# Patient Record
Sex: Female | Born: 1950
Health system: Southern US, Community
[De-identification: ages and names within clinical notes are randomized; demographics above are authoritative.]

## PROBLEM LIST (undated history)

## (undated) DIAGNOSIS — E669 Obesity, unspecified: Secondary | ICD-10-CM

## (undated) DIAGNOSIS — I10 Essential (primary) hypertension: Secondary | ICD-10-CM

## (undated) DIAGNOSIS — H409 Unspecified glaucoma: Secondary | ICD-10-CM

## (undated) DIAGNOSIS — H269 Unspecified cataract: Secondary | ICD-10-CM

## (undated) HISTORY — PX: TUBAL LIGATION: SHX77

## (undated) HISTORY — DX: Unspecified cataract: H26.9

## (undated) HISTORY — DX: Essential (primary) hypertension: I10

## (undated) HISTORY — PX: TONSILLECTOMY: SUR1361

## (undated) HISTORY — DX: Obesity, unspecified: E66.9

## (undated) HISTORY — DX: Unspecified glaucoma: H40.9

## (undated) HISTORY — PX: EYE SURGERY: SHX253

---

## 2003-12-03 ENCOUNTER — Other Ambulatory Visit: Admission: RE | Admit: 2003-12-03 | Discharge: 2003-12-03 | Payer: Self-pay | Admitting: Obstetrics and Gynecology

## 2007-08-30 ENCOUNTER — Encounter: Payer: Self-pay | Admitting: Family Medicine

## 2007-10-30 ENCOUNTER — Ambulatory Visit: Payer: Self-pay | Admitting: Family Medicine

## 2007-11-01 ENCOUNTER — Encounter: Payer: Self-pay | Admitting: Family Medicine

## 2007-11-19 DIAGNOSIS — H409 Unspecified glaucoma: Secondary | ICD-10-CM | POA: Insufficient documentation

## 2007-12-11 ENCOUNTER — Ambulatory Visit: Payer: Self-pay | Admitting: Family Medicine

## 2007-12-11 ENCOUNTER — Encounter: Payer: Self-pay | Admitting: Family Medicine

## 2007-12-11 ENCOUNTER — Other Ambulatory Visit: Admission: RE | Admit: 2007-12-11 | Discharge: 2007-12-11 | Payer: Self-pay | Admitting: Family Medicine

## 2008-04-15 ENCOUNTER — Encounter: Payer: Self-pay | Admitting: Family Medicine

## 2008-04-15 HISTORY — PX: COLONOSCOPY: SHX174

## 2008-12-11 ENCOUNTER — Telehealth: Payer: Self-pay | Admitting: Family Medicine

## 2009-03-31 ENCOUNTER — Ambulatory Visit: Payer: Self-pay | Admitting: Family Medicine

## 2009-03-31 ENCOUNTER — Other Ambulatory Visit: Admission: RE | Admit: 2009-03-31 | Discharge: 2009-03-31 | Payer: Self-pay | Admitting: Family Medicine

## 2009-03-31 ENCOUNTER — Encounter: Payer: Self-pay | Admitting: Family Medicine

## 2009-03-31 DIAGNOSIS — R5383 Other fatigue: Secondary | ICD-10-CM

## 2009-03-31 DIAGNOSIS — R5381 Other malaise: Secondary | ICD-10-CM | POA: Insufficient documentation

## 2009-03-31 LAB — CONVERTED CEMR LAB: OCCULT 1: NEGATIVE

## 2009-04-02 DIAGNOSIS — E669 Obesity, unspecified: Secondary | ICD-10-CM | POA: Insufficient documentation

## 2009-04-17 ENCOUNTER — Ambulatory Visit (HOSPITAL_COMMUNITY): Admission: RE | Admit: 2009-04-17 | Discharge: 2009-04-17 | Payer: Self-pay | Admitting: Family Medicine

## 2009-04-17 ENCOUNTER — Encounter: Payer: Self-pay | Admitting: Family Medicine

## 2009-04-17 LAB — CONVERTED CEMR LAB
BUN: 14 mg/dL (ref 6–23)
CO2: 27 meq/L (ref 19–32)
Chloride: 106 meq/L (ref 96–112)
Creatinine, Ser: 0.89 mg/dL (ref 0.40–1.20)
Eosinophils Absolute: 0.1 10*3/uL (ref 0.0–0.7)
Eosinophils Relative: 1 % (ref 0–5)
Glucose, Bld: 81 mg/dL (ref 70–99)
HCT: 40.2 % (ref 36.0–46.0)
Hemoglobin: 13 g/dL (ref 12.0–15.0)
LDL Cholesterol: 57 mg/dL (ref 0–99)
Lymphocytes Relative: 43 % (ref 12–46)
Lymphs Abs: 2.1 10*3/uL (ref 0.7–4.0)
MCV: 90.7 fL (ref 78.0–100.0)
Monocytes Absolute: 0.4 10*3/uL (ref 0.1–1.0)
Monocytes Relative: 9 % (ref 3–12)
Potassium: 4.6 meq/L (ref 3.5–5.3)
TSH: 1.065 microintl units/mL (ref 0.350–4.500)
Triglycerides: 47 mg/dL (ref <150)
VLDL: 9 mg/dL (ref 0–40)
WBC: 4.9 10*3/uL (ref 4.0–10.5)

## 2010-08-11 ENCOUNTER — Ambulatory Visit: Payer: Self-pay | Admitting: Family Medicine

## 2010-08-11 ENCOUNTER — Other Ambulatory Visit
Admission: RE | Admit: 2010-08-11 | Discharge: 2010-08-11 | Payer: Self-pay | Source: Home / Self Care | Admitting: Family Medicine

## 2010-08-11 ENCOUNTER — Ambulatory Visit (HOSPITAL_COMMUNITY)
Admission: RE | Admit: 2010-08-11 | Discharge: 2010-08-11 | Payer: Self-pay | Source: Home / Self Care | Attending: Family Medicine | Admitting: Family Medicine

## 2010-08-11 LAB — CONVERTED CEMR LAB: OCCULT 1: NEGATIVE

## 2010-08-17 ENCOUNTER — Ambulatory Visit (HOSPITAL_COMMUNITY)
Admission: RE | Admit: 2010-08-17 | Discharge: 2010-08-17 | Payer: Self-pay | Source: Home / Self Care | Attending: Family Medicine | Admitting: Family Medicine

## 2010-08-17 ENCOUNTER — Encounter: Payer: Self-pay | Admitting: Family Medicine

## 2010-08-17 LAB — CONVERTED CEMR LAB: Pap Smear: NEGATIVE

## 2010-08-18 LAB — CONVERTED CEMR LAB
Basophils Absolute: 0 10*3/uL (ref 0.0–0.1)
CO2: 27 meq/L (ref 19–32)
Calcium: 8.8 mg/dL (ref 8.4–10.5)
Cholesterol: 119 mg/dL (ref 0–200)
Eosinophils Relative: 2 % (ref 0–5)
HCT: 38.3 % (ref 36.0–46.0)
HDL: 48 mg/dL (ref >39)
Lymphocytes Relative: 42 % (ref 12–46)
Neutrophils Relative %: 47 % (ref 43–77)
Platelets: 196 10*3/uL (ref 150–400)
Potassium: 4.5 meq/L (ref 3.5–5.3)
RDW: 14.1 % (ref 11.5–15.5)
Sodium: 139 meq/L (ref 135–145)
Total CHOL/HDL Ratio: 2.5

## 2010-09-28 NOTE — Letter (Signed)
Summary: demo  demo   Imported By: Lind Guest 01/08/2010 14:01:15  _____________________________________________________________________  External Attachment:    Type:   Image     Comment:   External Document

## 2010-09-28 NOTE — Letter (Signed)
Summary: referrl sheet  referrl sheet   Imported By: Lind Guest 01/08/2010 13:50:28  _____________________________________________________________________  External Attachment:    Type:   Image     Comment:   External Document

## 2010-09-28 NOTE — Letter (Signed)
Summary: office notes  office notes   Imported By: Lind Guest 01/08/2010 13:51:53  _____________________________________________________________________  External Attachment:    Type:   Image     Comment:   External Document

## 2010-09-28 NOTE — Letter (Signed)
Summary: labs  labs   Imported By: Lind Guest 01/08/2010 13:52:28  _____________________________________________________________________  External Attachment:    Type:   Image     Comment:   External Document

## 2010-09-28 NOTE — Letter (Signed)
Summary: history and physial  history and physial   Imported By: Lind Guest 01/08/2010 13:53:49  _____________________________________________________________________  External Attachment:    Type:   Image     Comment:   External Document

## 2010-09-28 NOTE — Letter (Signed)
Summary: history and  physical  history and  physical   Imported By: Lind Guest 01/08/2010 14:02:33  _____________________________________________________________________  External Attachment:    Type:   Image     Comment:   External Document

## 2010-09-28 NOTE — Letter (Signed)
Summary: misc  misc   Imported By: Lind Guest 01/08/2010 13:47:35  _____________________________________________________________________  External Attachment:    Type:   Image     Comment:   External Document

## 2010-09-30 NOTE — Letter (Signed)
Summary: Pap Smear, Normal Letter, [facility]  [address]   [place], [place] [address]   Phone: [phone]  Fax: [phone]          August 17, 2010    Dear: Tanya Rogers    I am pleased to notify you that your PAP smear was normal.  You will need your next PAP smear in:     ____ 3 Months    ____ 6 Months    ____ 12 Months    Please call the office at our office number above, to schedule your next appointment.    Sincerely,     [facility]

## 2010-09-30 NOTE — Assessment & Plan Note (Signed)
Summary: physical/slj   Vital Signs:  Patient profile:   60 year old female Menstrual status:  postmenopausal Height:      63 inches Weight:      199 pounds BMI:     35.38 O2 Sat:      100 % on Room air Pulse rate:   98 / minute Pulse rhythm:   regular Resp:     16 per minute BP sitting:   126 / 78  (left arm)  Vitals Entered By: Adella Hare LPN (August 11, 2010 4:07 PM)  Nutrition Counseling: Patient's BMI is greater than 25 and therefore counseled on weight management options.  O2 Flow:  Room air CC: physical Is Patient Diabetic? No Pain Assessment Patient in pain? no       Vision Screening:Left eye with correction: 20 / 25 Right eye with correction: 20 / 20 Both eyes with correction: 20 / 20        Vision Entered By: Adella Hare LPN (August 11, 2010 4:09 PM)   CC:  physical.  History of Present Illness: Reports  that she has been doing well. Denies recent fever or chills. Denies sinus pressure, nasal congestion , ear pain or sore throat. Denies chest congestion, or cough productive of sputum. Denies chest pain, palpitations, PND, orthopnea or leg swelling. Denies abdominal pain, nausea, vomitting, diarrhea or constipation. Denies change in bowel movements or bloody stool. Denies dysuria , frequency, incontinence or hesitancy. Denies  joint pain, swelling, or reduced mobility. Denies headaches, vertigo, seizures. Denies depression, anxiety or insomnia. Denies  rash, lesions, or itch.     Current Medications (verified): 1)  Betimol 0.5 % Soln (Timolol Hemihydrate) 2)  Fish Oil 1000 Mg Caps (Omega-3 Fatty Acids) 3)  Calcium 1200 1200-1000 Mg-Unit Chew (Calcium Carbonate-Vit D-Min) 4)  B Complex-B12  Tabs (B Complex Vitamins) 5)  Multivitamins  Tabs (Multiple Vitamin) 6)  Folic Acid 1 Mg Tabs (Folic Acid) 7)  Zinc 100 Mg Tabs (Zinc)  Allergies (verified): No Known Drug Allergies  Social History: Retired Diplomatic Services operational officer, does in [place] , 2  CLIENTS 07/2010 Married x 40 yrs Three adult children Never Smoked Alcohol use-no Drug use-no  Review of Systems      See HPI Eyes:  Complains of vision loss-both eyes; glaucoma, needs to see opthalmologist. Endo:  Denies cold intolerance, excessive hunger, excessive thirst, and excessive urination. Heme:  Denies abnormal bruising and bleeding. Allergy:  Complains of seasonal allergies; denies hives or rash and itching eyes; mild.  Physical Exam  General:  Well-developed,obesed,in no acute distress; alert,appropriate and cooperative throughout examination Head:  Normocephalic and atraumatic without obvious abnormalities. No apparent alopecia or balding. Eyes:  vision grossly intact, pupils equal, pupils round, and pupils reactive to light.   Ears:  External ear exam shows no significant lesions or deformities.  Otoscopic examination reveals clear canals, tympanic membranes are intact bilaterally without bulging, retraction, inflammation or discharge. Hearing is grossly normal bilaterally. Nose:  External nasal examination shows no deformity or inflammation. Nasal mucosa are pink and moist without lesions or exudates. Mouth:  Oral mucosa and oropharynx without lesions or exudates.  Teeth in good repair. Neck:  No deformities, masses, or tenderness noted. Chest Wall:  No deformities, masses, or tenderness noted. Breasts:  No mass, nodules, thickening, tenderness, bulging, retraction, inflamation, nipple discharge or skin changes noted.   Lungs:  Normal respiratory effort, chest expands symmetrically. Lungs are clear to auscultation, no crackles or wheezes. Heart:  Normal rate and regular rhythm. S1  and S2 normal without gallop, murmur, click, rub or other extra sounds. Abdomen:  Bowel sounds positive,abdomen soft and non-tender without masses, organomegaly or hernias noted. Rectal:  No external abnormalities noted. Normal sphincter tone. No rectal masses or tenderness. Genitalia:  Normal  introitus for age, no external lesions, no vaginal discharge, mucosa pink and moist, no vaginal or cervical lesions, no vaginal atrophy, no friaility or hemorrhage, normal uterus size and position, no adnexal masses or tenderness Msk:  No deformity or scoliosis noted of thoracic or lumbar spine.   Pulses:  R and L carotid,radial,femoral,dorsalis pedis and posterior tibial pulses are full and equal bilaterally Extremities:  No clubbing, cyanosis, edema, or deformity noted with normal full range of motion of all joints.   Neurologic:  No cranial nerve deficits noted. Station and gait are normal. Plantar reflexes are down-going bilaterally. DTRs are symmetrical throughout. Sensory, motor and coordinative functions appear intact. Skin:  Intact without suspicious lesions or rashes Cervical Nodes:  No lymphadenopathy noted Axillary Nodes:  No palpable lymphadenopathy Inguinal Nodes:  No significant adenopathy Psych:  Cognition and judgment appear intact. Alert and cooperative with normal attention span and concentration. No apparent delusions, illusions, hallucinations   Impression & Recommendations:  Problem # 1:  SCREENING FOR MALIGNANT NEOPLASM OF THE CERVIX (ICD-V76.2) Assessment Comment Only  Orders: Pap Smear (95621)  Problem # 2:  OBESITY (ICD-278.00) Assessment: Deteriorated  Ht: 63 (08/11/2010)   Wt: 199 (08/11/2010)   BMI: 35.38 (08/11/2010) therapeutic lifestyle change discussed and encouraged  Problem # 3:  GLAUCOMA (ICD-365.9) Assessment: Comment Only  Orders: Ophthalmology Referral (Ophthalmology)  Complete Medication List: 1)  Betimol 0.5 % Soln (Timolol hemihydrate) 2)  Fish Oil 1000 Mg Caps (Omega-3 fatty acids) 3)  Calcium 1200 1200-1000 Mg-unit Chew (Calcium carbonate-vit d-min) 4)  B Complex-b12 Tabs (B complex vitamins) 5)  Multivitamins Tabs (Multiple vitamin) 6)  Folic Acid 1 Mg Tabs (Folic acid) 7)  Zinc 100 Mg Tabs (Zinc)  Other Orders: T-Basic Metabolic  Panel ([id number]) T-Lipid Profile [id number]) T-CBC w/Diff [id number]) T-TSH [id number]) T- Hemoglobin A1C [phone]) T-Vitamin D (25-Hydroxy) [phone]) Hemoccult Guaiac-1 spec.(in office) (66063)  Patient Instructions: 1)  F/U in 6 months 2)  It is important that you exercise regularly at least 30 minutes 5 times a week. If you develop chest pain, have severe difficulty breathing, or feel very tired , stop exercising immediately and seek medical attention. 3)  You need to lose weight. Consider a lower calorie diet and regular exercise.  4)  BMP prior to visit, ICD-9: 5)  Lipid Panel prior to visit, ICD-9:  FASTING ASAP 6)  TSH prior to visit, ICD-9: 7)  CBC w/ Diff prior to visit, ICD-9: 8)  hba1c 9)  yOU WILL BE REFERRED TO DR Harlon Flor FOR GLAUCOMA CARE   Orders Added: 1)  Est. Patient 40-64 years [99396] 2)  Ophthalmology Referral [Ophthalmology] 3)  T-Basic Metabolic Panel [[id number]] 4)  T-Lipid Profile [[id number]] 5)  T-CBC w/Diff [[id number]] 6)  T-TSH [[id number]] 7)  T- Hemoglobin A1C [[id number]] 8)  T-Vitamin D (25-Hydroxy) [[id number]] 9)  Hemoccult Guaiac-1 spec.(in office) [82270] 10)  Pap Smear [[id number]]    Laboratory Results  Date/Time Received: August 11, 2010 4:53 PM  Date/Time Reported: August 11, 2010 4:53 PM   Stool - Occult Blood Hemmoccult #1: negative Date: 08/11/2010 Comments: 51301 13L 10/13 118 10/12 [facility] LPN  August 11, 2010 4:53 PM

## 2011-07-22 ENCOUNTER — Other Ambulatory Visit: Payer: Self-pay | Admitting: Family Medicine

## 2011-07-22 DIAGNOSIS — Z139 Encounter for screening, unspecified: Secondary | ICD-10-CM

## 2011-08-15 ENCOUNTER — Encounter: Payer: Self-pay | Admitting: Family Medicine

## 2011-08-15 ENCOUNTER — Ambulatory Visit: Payer: Self-pay | Admitting: Family Medicine

## 2011-08-17 ENCOUNTER — Ambulatory Visit (INDEPENDENT_AMBULATORY_CARE_PROVIDER_SITE_OTHER): Payer: Managed Care, Other (non HMO) | Admitting: Family Medicine

## 2011-08-17 ENCOUNTER — Other Ambulatory Visit (HOSPITAL_COMMUNITY)
Admission: RE | Admit: 2011-08-17 | Discharge: 2011-08-17 | Disposition: A | Discharge status: Home / Self Care | Payer: Managed Care, Other (non HMO) | Source: Ambulatory Visit | Attending: Family Medicine | Admitting: Family Medicine

## 2011-08-17 ENCOUNTER — Encounter: Payer: Self-pay | Admitting: Family Medicine

## 2011-08-17 VITALS — BP 134/84 | HR 80 | Resp 16 | Ht 63.0 in | Wt 203.4 lb

## 2011-08-17 DIAGNOSIS — Z2911 Encounter for prophylactic immunotherapy for respiratory syncytial virus (RSV): Secondary | ICD-10-CM

## 2011-08-17 DIAGNOSIS — Z01419 Encounter for gynecological examination (general) (routine) without abnormal findings: Secondary | ICD-10-CM | POA: Insufficient documentation

## 2011-08-17 DIAGNOSIS — R7309 Other abnormal glucose: Secondary | ICD-10-CM

## 2011-08-17 DIAGNOSIS — H409 Unspecified glaucoma: Secondary | ICD-10-CM

## 2011-08-17 DIAGNOSIS — E669 Obesity, unspecified: Secondary | ICD-10-CM

## 2011-08-17 DIAGNOSIS — Z23 Encounter for immunization: Secondary | ICD-10-CM

## 2011-08-17 DIAGNOSIS — Z0000 Encounter for general adult medical examination without abnormal findings: Secondary | ICD-10-CM

## 2011-08-17 DIAGNOSIS — R7303 Prediabetes: Secondary | ICD-10-CM | POA: Insufficient documentation

## 2011-08-17 LAB — POC HEMOCCULT BLD/STL (OFFICE/1-CARD/DIAGNOSTIC): Fecal Occult Blood, POC: NEGATIVE

## 2011-08-17 NOTE — Patient Instructions (Signed)
F/u in 6 months.  HBa1C today.  Fasting in 6 months cbc, lipids, chem 7, HBa1C , tsh and Vit D  TdaP and Zostavax  pls get the flu shot at the pharmacy  It is important that you exercise regularly at least 30 minutes 5 times a week. If you develop chest pain, have severe difficulty breathing, or feel very tired, stop exercising immediately and seek medical attention   A healthy diet is rich in fruit, vegetables and whole grains. Poultry fish, nuts and beans are a healthy choice for protein rather then red meat. A low sodium diet and drinking 64 ounces of water daily is generally recommended. Oils and sweet should be limited. Carbohydrates especially for those who are diabetic or overweight, should be limited to 30-45 gram per meal. It is important to eat on a regular schedule, at least 3 times daily. Snacks should be primarily fruits, vegetables or nuts.  weight loss goal is 1.5 to 2 pounds per month

## 2011-08-18 LAB — HEMOGLOBIN A1C: Mean Plasma Glucose: 126 mg/dL — ABNORMAL HIGH (ref <117)

## 2011-08-18 NOTE — Progress Notes (Signed)
Subjective:     Patient ID: Tanya Rogers, female   DOB: 09/26/1950, 60 y.o.   MRN: [id number]  HPI The PT is here for follow annual exam and re-evaluation of chronic medical conditions, medication management and review of any available recent lab and radiology data.  Preventive health is updated, specifically  Cancer screening and Immunization.   Questions or concerns regarding consultations or procedures which the PT has had in the interim are  addressed. The PT denies any adverse reactions to current medications since the last visit.  There are no new concerns, except that she is aware of weight gain, and has not been exercising  There are no specific complaints      Review of Systems See HPI Denies recent fever or chills. Denies sinus pressure, nasal congestion, ear pain or sore throat. Denies chest congestion, productive cough or wheezing. Denies chest pains, palpitations and leg swelling Denies abdominal pain, nausea, vomiting,diarrhea or constipation.   Denies dysuria, frequency, hesitancy or incontinence. Denies joint pain, swelling and limitation in mobility. Denies headaches, seizures, numbness, or tingling. Denies depression, anxiety or insomnia. Denies skin break down or rash.        Objective:   Physical Exam Pleasant well nourished female, alert and oriented x 3, in no cardio-pulmonary distress. Afebrile. HEENT No facial trauma or asymetry. Sinuses non tender.  EOMI, PERTL, fundoscopic exam is normal, no hemorhage or exudate.  External ears normal, tympanic membranes clear. Oropharynx moist, no exudate, good dentition. Neck: supple, no adenopathy,JVD or thyromegaly.No bruits.  Chest: Clear to ascultation bilaterally.No crackles or wheezes. Non tender to palpation  Breast: No asymetry,no masses. No nipple discharge or inversion. No axillary or supraclavicular adenopathy  Cardiovascular system; Heart sounds normal,  S1 and  S2 ,no S3.  No murmur, or  thrill. Apical beat not displaced Peripheral pulses normal.  Abdomen: Soft, non tender, no organomegaly or masses. No bruits. Bowel sounds normal. No guarding, tenderness or rebound.  Rectal:  No mass. Guaiac negative stool.  GU: External genitalia normal. No lesions. Vaginal canal normal.No discharge. Uterus normal size, no adnexal masses, no cervical motion or adnexal tenderness.  Musculoskeletal exam: Full ROM of spine, hips , shoulders and knees. No deformity ,swelling or crepitus noted. No muscle wasting or atrophy.   Neurologic: Cranial nerves 2 to 12 intact. Power, tone ,sensation and reflexes normal throughout. No disturbance in gait. No tremor.  Skin: Intact, no ulceration, erythema , scaling or rash noted. Pigmentation normal throughout  Psych; Normal mood and affect. Judgement and concentration normal     Assessment:         Plan:

## 2011-08-18 NOTE — Assessment & Plan Note (Signed)
Deteriorated. Patient re-educated about  the importance of commitment to a  minimum of 150 minutes of exercise per week. The importance of healthy food choices with portion control discussed. Encouraged to start a food diary, count calories and to consider  joining a support group. Sample diet sheets offered. Goals set by the patient for the next several months.    

## 2011-08-18 NOTE — Assessment & Plan Note (Signed)
>>  ASSESSMENT AND PLAN FOR GLAUCOMA WRITTEN ON 08/18/2011  5:02 AM BY SIMPSON, MARGARET E, MD  Followed regularly by opthalmology

## 2011-08-18 NOTE — Assessment & Plan Note (Signed)
20 pound weight gain and no regular exercise, concern that this has worsened , wll f/u lab

## 2011-08-18 NOTE — Assessment & Plan Note (Signed)
Followed regularly by opthalmology

## 2011-08-19 ENCOUNTER — Ambulatory Visit: Payer: Self-pay | Admitting: Family Medicine

## 2011-08-19 ENCOUNTER — Ambulatory Visit (HOSPITAL_COMMUNITY)
Admission: RE | Admit: 2011-08-19 | Discharge: 2011-08-19 | Disposition: A | Discharge status: Home / Self Care | Payer: Managed Care, Other (non HMO) | Source: Ambulatory Visit | Attending: Family Medicine | Admitting: Family Medicine

## 2011-08-19 DIAGNOSIS — Z1231 Encounter for screening mammogram for malignant neoplasm of breast: Secondary | ICD-10-CM | POA: Insufficient documentation

## 2011-08-19 DIAGNOSIS — Z139 Encounter for screening, unspecified: Secondary | ICD-10-CM

## 2011-09-05 ENCOUNTER — Ambulatory Visit (INDEPENDENT_AMBULATORY_CARE_PROVIDER_SITE_OTHER): Payer: Managed Care, Other (non HMO) | Admitting: Family Medicine

## 2011-09-05 ENCOUNTER — Encounter: Payer: Self-pay | Admitting: Family Medicine

## 2011-09-05 VITALS — BP 120/72 | HR 70 | Temp 98.3°F | Resp 18 | Ht 63.0 in | Wt 200.1 lb

## 2011-09-05 DIAGNOSIS — J069 Acute upper respiratory infection, unspecified: Secondary | ICD-10-CM

## 2011-09-05 NOTE — Progress Notes (Signed)
  Subjective:    Patient ID: [name], female    DOB: 1951-05-30, 61 y.o.   MRN: [id number]  HPI Itchy, sore throat for the past 2 days. Denies fever, cough, headache. Positive sick contact with family members who all had upper respiratory infections. Over-the-counter Alka-Seltzer use without any relief. Sore throat associated with mild body ache. 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   Review of Systems -per above GEN- + fatigue,denies fever, weight loss,weakness, recent illness HEENT- denies eye drainage, +itchy ears, +nasal discharge, CVS- denies chest pain, palpitations RESP- denies SOB, cough, wheeze ABD- denies N/V, change in stools, abd pain    Objective:   Physical Exam GEN- NAD, alert and oriented x3 HEENT- PERRL, EOMI, non injected sclera, pink conjunctiva, MMM, oropharynx injected, TM clear bilat Neck- Supple,  Lymph- mild shotty cervical LAD CVS- RRR, no murmur RESP-CTAB EXT- No edema Pulses- Radial, DP- 2+           Assessment & Plan:   URI- viral illness, supportive care, neg strep swab, otherwise healthy individual Chloroseptic spray Given Red flags

## 2011-09-05 NOTE — Patient Instructions (Signed)
Take Vitamin C and plenty of fluids Use Chloroseptic Spray If you develop fever, or worsening of illness come back for a recheck  Upper Respiratory Infection, Adult An upper respiratory infection (URI) is also sometimes known as the common cold. The upper respiratory tract includes the nose, sinuses, throat, trachea, and bronchi. Bronchi are the airways leading to the lungs. Most people improve within 1 week, but symptoms can last up to 2 weeks. A residual cough may last even longer.   CAUSES Many different viruses can infect the tissues lining the upper respiratory tract. The tissues become irritated and inflamed and often become very moist. Mucus production is also common. A cold is contagious. You can easily spread the virus to others by oral contact. This includes kissing, sharing a glass, coughing, or sneezing. Touching your mouth or nose and then touching a surface, which is then touched by another person, can also spread the virus. SYMPTOMS   Symptoms typically develop 1 to 3 days after you come in contact with a cold virus. Symptoms vary from person to person. They may include:  Runny nose.     Sneezing.    Nasal congestion.     Sinus irritation.     Sore throat.     Loss of voice (laryngitis).     Cough.    Fatigue.    Muscle aches.     Loss of appetite.     Headache.    Low-grade fever.  DIAGNOSIS   You might diagnose your own cold based on familiar symptoms, since most people get a cold 2 to 3 times a year. Your caregiver can confirm this based on your exam. Most importantly, your caregiver can check that your symptoms are not due to another disease such as strep throat, sinusitis, pneumonia, asthma, or epiglottitis. Blood tests, throat tests, and X-rays are not necessary to diagnose a common cold, but they may sometimes be helpful in excluding other more serious diseases. Your caregiver will decide if any further tests are required. RISKS AND COMPLICATIONS   You may  be at risk for a more severe case of the common cold if you smoke cigarettes, have chronic heart disease (such as heart failure) or lung disease (such as asthma), or if you have a weakened immune system. The very young and very old are also at risk for more serious infections. Bacterial sinusitis, middle ear infections, and bacterial pneumonia can complicate the common cold. The common cold can worsen asthma and chronic obstructive pulmonary disease (COPD). Sometimes, these complications can require emergency medical care and may be life-threatening. PREVENTION   The best way to protect against getting a cold is to practice good hygiene. Avoid oral or hand contact with people with cold symptoms. Wash your hands often if contact occurs. There is no clear evidence that vitamin C, vitamin E, echinacea, or exercise reduces the chance of developing a cold. However, it is always recommended to get plenty of rest and practice good nutrition. TREATMENT   Treatment is directed at relieving symptoms. There is no cure. Antibiotics are not effective, because the infection is caused by a virus, not by bacteria. Treatment may include:  Increased fluid intake. Sports drinks offer valuable electrolytes, sugars, and fluids.     Breathing heated mist or steam (vaporizer or shower).     Eating chicken soup or other clear broths, and maintaining good nutrition.     Getting plenty of rest.     Using gargles or lozenges for comfort.  Controlling fevers with ibuprofen or acetaminophen as directed by your caregiver.     Increasing usage of your inhaler if you have asthma.  Zinc gel and zinc lozenges, taken in the first 24 hours of the common cold, can shorten the duration and lessen the severity of symptoms. Pain medicines may help with fever, muscle aches, and throat pain. A variety of non-prescription medicines are available to treat congestion and runny nose. Your caregiver can make recommendations and may suggest  nasal or lung inhalers for other symptoms.   HOME CARE INSTRUCTIONS    Only take over-the-counter or prescription medicines for pain, discomfort, or fever as directed by your caregiver.     Use a warm mist humidifier or inhale steam from a shower to increase air moisture. This may keep secretions moist and make it easier to breathe.     Drink enough water and fluids to keep your urine clear or pale yellow.     Rest as needed.     Return to work when your temperature has returned to normal or as your caregiver advises. You may need to stay home longer to avoid infecting others. You can also use a face mask and careful hand washing to prevent spread of the virus.  SEEK MEDICAL CARE IF:    After the first few days, you feel you are getting worse rather than better.     You need your caregiver's advice about medicines to control symptoms.     You develop chills, worsening shortness of breath, or brown or red sputum. These may be signs of pneumonia.     You develop yellow or brown nasal discharge or pain in the face, especially when you bend forward. These may be signs of sinusitis.     You develop a fever, swollen neck glands, pain with swallowing, or white areas in the back of your throat. These may be signs of strep throat.  SEEK IMMEDIATE MEDICAL CARE IF:    You have a fever.     You develop severe or persistent headache, ear pain, sinus pain, or chest pain.     You develop wheezing, a prolonged cough, cough up blood, or have a change in your usual mucus (if you have chronic lung disease).     You develop sore muscles or a stiff neck.  Document Released: 02/08/2001 Document Revised: 04/27/2011 Document Reviewed: 12/17/2010 [facility] Patient Information 2012 [place], [place].

## 2011-09-06 NOTE — Progress Notes (Signed)
Addended by: Kandis Fantasia B on: 09/06/2011 11:32 AM   Modules accepted: Orders

## 2012-02-17 ENCOUNTER — Ambulatory Visit: Payer: Managed Care, Other (non HMO) | Admitting: Family Medicine

## 2012-03-23 ENCOUNTER — Ambulatory Visit: Payer: Managed Care, Other (non HMO) | Admitting: Family Medicine

## 2012-05-04 ENCOUNTER — Ambulatory Visit: Payer: Managed Care, Other (non HMO) | Admitting: Family Medicine

## 2012-06-29 ENCOUNTER — Ambulatory Visit: Payer: Managed Care, Other (non HMO) | Admitting: Family Medicine

## 2012-07-20 ENCOUNTER — Other Ambulatory Visit: Payer: Self-pay | Admitting: Family Medicine

## 2012-07-20 DIAGNOSIS — Z139 Encounter for screening, unspecified: Secondary | ICD-10-CM

## 2012-08-09 ENCOUNTER — Ambulatory Visit: Payer: Managed Care, Other (non HMO) | Admitting: Family Medicine

## 2012-08-23 ENCOUNTER — Encounter: Payer: Self-pay | Admitting: Family Medicine

## 2012-08-23 ENCOUNTER — Ambulatory Visit (INDEPENDENT_AMBULATORY_CARE_PROVIDER_SITE_OTHER): Payer: PRIVATE HEALTH INSURANCE | Admitting: Family Medicine

## 2012-08-23 ENCOUNTER — Other Ambulatory Visit (HOSPITAL_COMMUNITY)
Admission: RE | Admit: 2012-08-23 | Discharge: 2012-08-23 | Disposition: A | Discharge status: Home / Self Care | Payer: Managed Care, Other (non HMO) | Source: Ambulatory Visit | Attending: Family Medicine | Admitting: Family Medicine

## 2012-08-23 ENCOUNTER — Ambulatory Visit (HOSPITAL_COMMUNITY)
Admission: RE | Admit: 2012-08-23 | Discharge: 2012-08-23 | Disposition: A | Discharge status: Home / Self Care | Payer: Managed Care, Other (non HMO) | Source: Ambulatory Visit | Attending: Family Medicine | Admitting: Family Medicine

## 2012-08-23 VITALS — BP 150/84 | HR 69 | Resp 18 | Ht 63.0 in | Wt 196.1 lb

## 2012-08-23 DIAGNOSIS — Z01419 Encounter for gynecological examination (general) (routine) without abnormal findings: Secondary | ICD-10-CM | POA: Insufficient documentation

## 2012-08-23 DIAGNOSIS — I10 Essential (primary) hypertension: Secondary | ICD-10-CM | POA: Insufficient documentation

## 2012-08-23 DIAGNOSIS — Z0000 Encounter for general adult medical examination without abnormal findings: Secondary | ICD-10-CM

## 2012-08-23 DIAGNOSIS — Z1231 Encounter for screening mammogram for malignant neoplasm of breast: Secondary | ICD-10-CM | POA: Insufficient documentation

## 2012-08-23 DIAGNOSIS — R5383 Other fatigue: Secondary | ICD-10-CM

## 2012-08-23 DIAGNOSIS — R7309 Other abnormal glucose: Secondary | ICD-10-CM

## 2012-08-23 DIAGNOSIS — Z1211 Encounter for screening for malignant neoplasm of colon: Secondary | ICD-10-CM

## 2012-08-23 DIAGNOSIS — N3000 Acute cystitis without hematuria: Secondary | ICD-10-CM | POA: Insufficient documentation

## 2012-08-23 DIAGNOSIS — R5381 Other malaise: Secondary | ICD-10-CM

## 2012-08-23 DIAGNOSIS — R7303 Prediabetes: Secondary | ICD-10-CM

## 2012-08-23 DIAGNOSIS — Z1151 Encounter for screening for human papillomavirus (HPV): Secondary | ICD-10-CM | POA: Insufficient documentation

## 2012-08-23 DIAGNOSIS — E669 Obesity, unspecified: Secondary | ICD-10-CM

## 2012-08-23 DIAGNOSIS — Z139 Encounter for screening, unspecified: Secondary | ICD-10-CM

## 2012-08-23 LAB — POCT URINALYSIS DIPSTICK
Bilirubin, UA: NEGATIVE
Glucose, UA: NEGATIVE
Leukocytes, UA: NEGATIVE
Nitrite, UA: NEGATIVE
pH, UA: 7.5

## 2012-08-23 LAB — CBC
MCH: 30 pg (ref 26.0–34.0)
MCHC: 33.2 g/dL (ref 30.0–36.0)
Platelets: 215 10*3/uL (ref 150–400)
RDW: 14 % (ref 11.5–15.5)

## 2012-08-23 MED ORDER — POTASSIUM CHLORIDE CRYS ER 20 MEQ PO TBCR: 20.0000 meq | EXTENDED_RELEASE_TABLET | Freq: Every day | ORAL | Status: DC

## 2012-08-23 MED ORDER — HYDROCHLOROTHIAZIDE 12.5 MG PO CAPS: 12.5000 mg | ORAL_CAPSULE | Freq: Every day | ORAL | Status: DC

## 2012-08-23 NOTE — Patient Instructions (Addendum)
F/u in 3 month, please call if you need me before  Blood pressure is elevated, you will start medication for this. Please cut back on salt, eat more fresh and frozen fruit and vegetable, commit to daily exercise for a minimum of 30 minutes, and weight loss It is important that you exercise regularly at least 30 minutes 5 times a week. If you develop chest pain, have severe difficulty breathing, or feel very tired, stop exercising immediately and seek medical attention   A healthy diet is rich in fruit, vegetables and whole grains. Poultry fish, nuts and beans are a healthy choice for protein rather then red meat. A low sodium diet and drinking 64 ounces of water daily is generally recommended. Oils and sweet should be limited. Carbohydrates especially for those who are diabetic or overweight, should be limited to 30-45 gram per meal. It is important to eat on a regular schedule, at least 3 times daily. Snacks should be primarily fruits, vegetables or nuts.  You need a flu vaccine please get at the pharmacy  Lab today, chem 7, hBa1C and TSH and CBC  Lab in 3 month, before next visit, chem 7 and hBA1C, non fasting  Recent cholesterol was excellent , keep it up!  No sign of infection in the urine

## 2012-08-23 NOTE — Progress Notes (Signed)
  Subjective:    Patient ID: Tanya Rogers, female    DOB: [birth year], 61 y.o.   MRN: [id number]  HPI The PT is here for follow up and re-evaluation of chronic medical conditions, medication management and review of any available recent lab and radiology data.  Preventive health is updated, specifically  Cancer screening and Immunization.   Questions or concerns regarding consultations or procedures which the PT has had in the interim are  Addressed.Sees opthalmologist re glaucoma which seems to be well controlled The PT denies any adverse reactions to current medications since the last visit.  There are no new concerns. Has worked on dietary change with weight loss There are no specific complaints       Review of Systems See HPI Denies recent fever or chills. Denies sinus pressure, nasal congestion, ear pain or sore throat. Denies chest congestion, productive cough or wheezing. Denies chest pains, palpitations and leg swelling Denies abdominal pain, nausea, vomiting,diarrhea or constipation.   Denies dysuria, frequency, hesitancy or incontinence. Denies joint pain, swelling and limitation in mobility. Denies headaches, seizures, numbness, or tingling. Denies depression, anxiety or insomnia. Denies skin break down or rash.        Objective:   Physical Exam  Pleasant well nourished female, alert and oriented x 3, in no cardio-pulmonary distress. Afebrile. HEENT No facial trauma or asymetry. Sinuses non tender.  EOMI, PERTLExternal ears normal, tympanic membranes clear. Oropharynx moist, no exudate, goofairly good  dentition. Neck: supple, no adenopathy,JVD or thyromegaly.No bruits.  Chest: Clear to ascultation bilaterally.No crackles or wheezes. Non tender to palpation  Breast: No asymetry,no masses. No nipple discharge or inversion. No axillary or supraclavicular adenopathy  Cardiovascular system; Heart sounds normal,  S1 and  S2 ,no S3.  No murmur, or  thrill. Apical beat not displaced Peripheral pulses normal.  Abdomen: Soft, non tender, no organomegaly or masses. No bruits. Bowel sounds normal. No guarding, tenderness or rebound.  Rectal:  No mass. Guaiac negative stool.  GU: External genitalia normal. No lesions. Vaginal canal normal.No discharge. Uterus normal size, no adnexal masses, no cervical motion or adnexal tenderness.  Musculoskeletal exam: Full ROM of spine, hips , shoulders and knees. No deformity ,swelling or crepitus noted. No muscle wasting or atrophy.   Neurologic: Cranial nerves 2 to 12 intact. Power, tone ,sensation and reflexes normal throughout. No disturbance in gait. No tremor.  Skin: Intact, no ulceration, erythema , scaling or rash noted. Pigmentation normal throughout  Psych; Normal mood and affect. Judgement and concentration normal       Assessment & Plan:

## 2012-08-24 LAB — BASIC METABOLIC PANEL
Calcium: 9.2 mg/dL (ref 8.4–10.5)
Sodium: 142 mEq/L (ref 135–145)

## 2012-08-24 LAB — TSH: TSH: 1.059 u[IU]/mL (ref 0.350–4.500)

## 2012-08-24 LAB — HEMOGLOBIN A1C: Hgb A1c MFr Bld: 6 % — ABNORMAL HIGH (ref <5.7)

## 2012-08-26 DIAGNOSIS — Z0000 Encounter for general adult medical examination without abnormal findings: Secondary | ICD-10-CM | POA: Insufficient documentation

## 2012-08-26 NOTE — Assessment & Plan Note (Signed)
Pelvic and rectal and breast exam completed as documented. Counseling re the need for lifestyle change o improve health also done. Immunization needs addressed

## 2012-08-26 NOTE — Assessment & Plan Note (Signed)
New diagnosis, pt to start medication DASH diet and commitment to daily physical activity for a minimum of 30 minutes discussed and encouraged, as a part of hypertension management. The importance of attaining a healthy weight is also discussed.

## 2012-08-28 ENCOUNTER — Other Ambulatory Visit: Payer: Self-pay | Admitting: Family Medicine

## 2012-09-06 ENCOUNTER — Other Ambulatory Visit: Payer: Self-pay

## 2012-09-06 MED ORDER — FLUCONAZOLE 150 MG PO TABS: ORAL_TABLET | ORAL | Status: DC

## 2012-09-26 ENCOUNTER — Telehealth: Payer: Self-pay | Admitting: Family Medicine

## 2012-09-27 NOTE — Telephone Encounter (Signed)
Joan spoke with patient  

## 2012-12-28 ENCOUNTER — Ambulatory Visit: Payer: Managed Care, Other (non HMO) | Admitting: Family Medicine

## 2013-01-25 ENCOUNTER — Ambulatory Visit: Payer: Managed Care, Other (non HMO) | Admitting: Family Medicine

## 2013-02-08 ENCOUNTER — Ambulatory Visit: Payer: Managed Care, Other (non HMO) | Admitting: Family Medicine

## 2013-03-15 ENCOUNTER — Ambulatory Visit: Payer: Managed Care, Other (non HMO) | Admitting: Family Medicine

## 2013-03-22 ENCOUNTER — Telehealth: Payer: Self-pay | Admitting: Family Medicine

## 2013-03-22 DIAGNOSIS — R7303 Prediabetes: Secondary | ICD-10-CM

## 2013-03-22 DIAGNOSIS — E669 Obesity, unspecified: Secondary | ICD-10-CM

## 2013-03-22 DIAGNOSIS — Z139 Encounter for screening, unspecified: Secondary | ICD-10-CM

## 2013-03-22 DIAGNOSIS — I10 Essential (primary) hypertension: Secondary | ICD-10-CM

## 2013-03-22 DIAGNOSIS — R5381 Other malaise: Secondary | ICD-10-CM

## 2013-03-22 LAB — CBC WITH DIFFERENTIAL/PLATELET
Eosinophils Absolute: 0.1 10*3/uL (ref 0.0–0.7)
Hemoglobin: 13.6 g/dL (ref 12.0–15.0)
Lymphocytes Relative: 43 % (ref 12–46)
Lymphs Abs: 2.3 10*3/uL (ref 0.7–4.0)
MCH: 30.4 pg (ref 26.0–34.0)
Monocytes Relative: 9 % (ref 3–12)
Neutro Abs: 2.5 10*3/uL (ref 1.7–7.7)
Neutrophils Relative %: 46 % (ref 43–77)
Platelets: 216 10*3/uL (ref 150–400)
RBC: 4.48 MIL/uL (ref 3.87–5.11)
WBC: 5.4 10*3/uL (ref 4.0–10.5)

## 2013-03-22 NOTE — Telephone Encounter (Signed)
Lab order faxed to the lab. 

## 2013-03-23 LAB — LIPID PANEL
Cholesterol: 125 mg/dL (ref 0–200)
HDL: 47 mg/dL (ref >39)
LDL Cholesterol: 67 mg/dL (ref 0–99)
Triglycerides: 57 mg/dL (ref <150)

## 2013-03-23 LAB — COMPREHENSIVE METABOLIC PANEL
Albumin: 3.9 g/dL (ref 3.5–5.2)
Alkaline Phosphatase: 70 U/L (ref 39–117)
BUN: 20 mg/dL (ref 6–23)
CO2: 24 mEq/L (ref 19–32)
Calcium: 9.3 mg/dL (ref 8.4–10.5)
Glucose, Bld: 81 mg/dL (ref 70–99)
Potassium: 4.6 mEq/L (ref 3.5–5.3)
Sodium: 138 mEq/L (ref 135–145)
Total Protein: 7.2 g/dL (ref 6.0–8.3)

## 2013-03-28 ENCOUNTER — Ambulatory Visit: Payer: Managed Care, Other (non HMO) | Admitting: Family Medicine

## 2013-05-10 ENCOUNTER — Encounter: Payer: Self-pay | Admitting: Family Medicine

## 2013-05-10 ENCOUNTER — Ambulatory Visit (INDEPENDENT_AMBULATORY_CARE_PROVIDER_SITE_OTHER): Payer: PRIVATE HEALTH INSURANCE | Admitting: Family Medicine

## 2013-05-10 VITALS — BP 130/82 | HR 82 | Resp 16 | Wt 196.1 lb

## 2013-05-10 DIAGNOSIS — L309 Dermatitis, unspecified: Secondary | ICD-10-CM

## 2013-05-10 DIAGNOSIS — L282 Other prurigo: Secondary | ICD-10-CM | POA: Insufficient documentation

## 2013-05-10 DIAGNOSIS — E669 Obesity, unspecified: Secondary | ICD-10-CM

## 2013-05-10 DIAGNOSIS — M549 Dorsalgia, unspecified: Secondary | ICD-10-CM

## 2013-05-10 DIAGNOSIS — R21 Rash and other nonspecific skin eruption: Secondary | ICD-10-CM

## 2013-05-10 DIAGNOSIS — R7303 Prediabetes: Secondary | ICD-10-CM

## 2013-05-10 DIAGNOSIS — R7309 Other abnormal glucose: Secondary | ICD-10-CM

## 2013-05-10 DIAGNOSIS — L259 Unspecified contact dermatitis, unspecified cause: Secondary | ICD-10-CM

## 2013-05-10 DIAGNOSIS — R079 Chest pain, unspecified: Secondary | ICD-10-CM | POA: Insufficient documentation

## 2013-05-10 MED ORDER — PREDNISONE 5 MG PO TABS: 5.0000 mg | ORAL_TABLET | Freq: Two times a day (BID) | ORAL | Status: AC

## 2013-05-10 MED ORDER — IBUPROFEN 800 MG PO TABS: 800.0000 mg | ORAL_TABLET | Freq: Three times a day (TID) | ORAL | Status: DC | PRN

## 2013-05-10 MED ORDER — ESOMEPRAZOLE MAGNESIUM 40 MG PO CPDR: 40.0000 mg | DELAYED_RELEASE_CAPSULE | Freq: Every day | ORAL | Status: DC

## 2013-05-10 MED ORDER — METHYLPREDNISOLONE ACETATE 80 MG/ML IJ SUSP
80.0000 mg | Freq: Once | INTRAMUSCULAR | Status: AC
  Administered 2013-05-10: 80 mg via INTRAMUSCULAR

## 2013-05-10 MED ORDER — KETOROLAC TROMETHAMINE 60 MG/2ML IM SOLN
60.0000 mg | Freq: Once | INTRAMUSCULAR | Status: AC
  Administered 2013-05-10: 60 mg via INTRAMUSCULAR

## 2013-05-10 NOTE — Progress Notes (Signed)
  Subjective:    Patient ID: [name], female    DOB: 1951/02/18, 62 y.o.   MRN: [id number]  HPI 2 month h/o LBP radiating to right thigh. No specific aggravating factor, she c/o pain being worse when she just gets up and starts moving around and lasts for approx 1 hour. Denies weakness or numbness in the leg, no incontinence of stool or urine. Intends to commit to regular exercise, diet is healthy, concerned about lack of weight loss   Review of Systems See HPI Denies recent fever or chills. Denies sinus pressure, nasal congestion, ear pain or sore throat. Denies chest congestion, productive cough or wheezing. Denies chest pains, palpitations and leg swelling Denies abdominal pain, nausea, vomiting,diarrhea or constipation.   Denies dysuria, frequency, hesitancy or incontinence.  Denies headaches, seizures, numbness, or tingling. Denies depression, anxiety or insomnia. C/o irritation and scratching on upper back centrally for weeks.        Objective:   Physical Exam Patient alert and oriented and in no cardiopulmonary distress.  HEENT: No facial asymmetry, EOMI, no sinus tenderness,  oropharynx pink and moist.  Neck supple no adenopathy.  Chest: Clear to auscultation bilaterally.  CVS: S1, S2 no murmurs, no S3.  ABD: Soft non tender. Bowel sounds normal.  Ext: No edema  MS: Adequate ROM spine, shoulders, hips and knees.  Skin: Intact, erythematous macular rash on upper back, central area with scratch marks  Psych: Good eye contact, normal affect. Memory intact not anxious or depressed appearing.  CNS: CN 2-12 intact, power, tone and sensation normal throughout.        Assessment & Plan:

## 2013-05-10 NOTE — Patient Instructions (Addendum)
CPE early January, call if t you need me before  HBA1C today please  Toradol 60mg  and depo medrol 80mg  Im today for back pian going down right leg, followed by ibuprofen and prednisone. Take nexium 1 daily for 7 days to protect stomach from excessive irritation please, you will get script and coupon for this CALL if no better in the next 4 weeks   Labs are excellent!  PLS take aspirin 81mg  one daily to reduce stroke risk  Pls commit to daily exercise for 30 to 60 minutes for weight loss, goal is 5 pounds till next visit  Use OTC hydrocortisone cream sparingly twice daily for 5 days to irritated area on upper back  Pls sched and get mammogram when due in [place]

## 2013-05-12 DIAGNOSIS — L309 Dermatitis, unspecified: Secondary | ICD-10-CM | POA: Insufficient documentation

## 2013-05-12 NOTE — Assessment & Plan Note (Signed)
Update lab needed. Patient educated about the importance of limiting  Carbohydrate intake , the need to commit to daily physical activity for a minimum of 30 minutes , and to commit weight loss. The fact that changes in all these areas will reduce or eliminate all together the development of diabetes is stressed.

## 2013-05-12 NOTE — Assessment & Plan Note (Signed)
unchanged Patient re-educated about  the importance of commitment to a  minimum of 150 minutes of exercise per week. The importance of healthy food choices with portion control discussed. Encouraged to start a food diary, count calories and to consider  joining a support group. Sample diet sheets offered. Goals set by the patient for the next several months.    

## 2013-05-12 NOTE — Assessment & Plan Note (Signed)
toradol and depo medrol in office followed by oral meds. If no improvement will need imaging Weight loss and commitment to daily exercise encouraged

## 2013-05-13 NOTE — Assessment & Plan Note (Signed)
Topical steroid cream to be used

## 2013-05-13 NOTE — Assessment & Plan Note (Signed)
Hydrocortisone cream to affected area twice daily as needed

## 2013-05-17 LAB — HEMOGLOBIN A1C: Mean Plasma Glucose: 126 mg/dL — ABNORMAL HIGH (ref <117)

## 2013-08-27 ENCOUNTER — Other Ambulatory Visit: Payer: Self-pay | Admitting: Family Medicine

## 2013-08-27 DIAGNOSIS — Z139 Encounter for screening, unspecified: Secondary | ICD-10-CM

## 2013-09-06 ENCOUNTER — Encounter: Payer: PRIVATE HEALTH INSURANCE | Admitting: Family Medicine

## 2013-09-23 ENCOUNTER — Ambulatory Visit (HOSPITAL_COMMUNITY): Payer: PRIVATE HEALTH INSURANCE

## 2013-09-30 ENCOUNTER — Ambulatory Visit (INDEPENDENT_AMBULATORY_CARE_PROVIDER_SITE_OTHER): Payer: PRIVATE HEALTH INSURANCE | Admitting: Family Medicine

## 2013-09-30 ENCOUNTER — Ambulatory Visit (HOSPITAL_COMMUNITY): Payer: PRIVATE HEALTH INSURANCE

## 2013-09-30 ENCOUNTER — Encounter (INDEPENDENT_AMBULATORY_CARE_PROVIDER_SITE_OTHER): Payer: Self-pay

## 2013-09-30 ENCOUNTER — Encounter: Payer: Self-pay | Admitting: Family Medicine

## 2013-09-30 VITALS — BP 138/86 | HR 62 | Temp 98.6°F | Resp 18 | Ht 63.0 in | Wt 202.0 lb

## 2013-09-30 DIAGNOSIS — E669 Obesity, unspecified: Secondary | ICD-10-CM

## 2013-09-30 DIAGNOSIS — N3000 Acute cystitis without hematuria: Secondary | ICD-10-CM

## 2013-09-30 DIAGNOSIS — J320 Chronic maxillary sinusitis: Secondary | ICD-10-CM

## 2013-09-30 DIAGNOSIS — R7303 Prediabetes: Secondary | ICD-10-CM

## 2013-09-30 DIAGNOSIS — R7309 Other abnormal glucose: Secondary | ICD-10-CM

## 2013-09-30 LAB — POCT URINALYSIS DIPSTICK
Bilirubin, UA: NEGATIVE
GLUCOSE UA: NEGATIVE
Ketones, UA: NEGATIVE
Leukocytes, UA: NEGATIVE
Nitrite, UA: NEGATIVE
Protein, UA: NEGATIVE
RBC UA: NEGATIVE
SPEC GRAV UA: 1.02
UROBILINOGEN UA: 0.2
pH, UA: 7.5

## 2013-09-30 LAB — CBC WITH DIFFERENTIAL/PLATELET
BASOS ABS: 0 10*3/uL (ref 0.0–0.1)
BASOS PCT: 0 % (ref 0–1)
EOS ABS: 0.2 10*3/uL (ref 0.0–0.7)
Eosinophils Relative: 3 % (ref 0–5)
HCT: 38.3 % (ref 36.0–46.0)
HEMOGLOBIN: 13.1 g/dL (ref 12.0–15.0)
Lymphocytes Relative: 34 % (ref 12–46)
Lymphs Abs: 2.4 10*3/uL (ref 0.7–4.0)
MCH: 29.8 pg (ref 26.0–34.0)
MCHC: 34.2 g/dL (ref 30.0–36.0)
MCV: 87.2 fL (ref 78.0–100.0)
MONOS PCT: 9 % (ref 3–12)
Monocytes Absolute: 0.6 10*3/uL (ref 0.1–1.0)
NEUTROS PCT: 54 % (ref 43–77)
Neutro Abs: 3.9 10*3/uL (ref 1.7–7.7)
PLATELETS: 207 10*3/uL (ref 150–400)
RBC: 4.39 MIL/uL (ref 3.87–5.11)
RDW: 13.9 % (ref 11.5–15.5)
WBC: 7 10*3/uL (ref 4.0–10.5)

## 2013-09-30 LAB — HEMOGLOBIN A1C
HEMOGLOBIN A1C: 6 % — AB (ref <5.7)
Mean Plasma Glucose: 126 mg/dL — ABNORMAL HIGH (ref <117)

## 2013-09-30 MED ORDER — LEVOFLOXACIN 500 MG PO TABS: 500.0000 mg | ORAL_TABLET | Freq: Every day | ORAL | Status: DC

## 2013-09-30 MED ORDER — LEVOFLOXACIN 500 MG PO TABS
500.0000 mg | ORAL_TABLET | Freq: Every day | ORAL | Status: DC
Start: 2013-09-30 — End: 2016-09-06

## 2013-09-30 NOTE — Patient Instructions (Addendum)
F/u in 4 month, call if you need me before  It is important that you exercise regularly at least 30 minutes 5 times a week. If you develop chest pain, have severe difficulty breathing, or feel very tired, stop exercising immediately and seek medical attention   A healthy diet is rich in fruit, vegetables and whole grains. Poultry fish, nuts and beans are a healthy choice for protein rather then red meat. A low sodium diet and drinking 64 ounces of water daily is generally recommended. Oils and sweet should be limited. Carbohydrates especially for those who are diabetic or overweight, should be limited to 60-45 gram per meal. It is important to eat on a regular schedule, at least 3 times daily. Snacks should be primarily fruits, vegetables or nuts.  Pls work on lifestyle change consistently to improve blood pressure and blood sugar   CBc and diff and HBA1C today  Urine is being checked for infection, no evidence of infection is present  Medication  Is sent on for left sinus pain presumed due to infection.  I hope you feel better soon, please  call back if you continue to feel ill after 2 to 3 days

## 2013-10-03 ENCOUNTER — Ambulatory Visit (HOSPITAL_COMMUNITY)
Admission: RE | Admit: 2013-10-03 | Discharge: 2013-10-03 | Disposition: A | Discharge status: Home / Self Care | Payer: No Typology Code available for payment source | Source: Ambulatory Visit | Attending: Family Medicine | Admitting: Family Medicine

## 2013-10-03 ENCOUNTER — Telehealth: Payer: Self-pay

## 2013-10-03 DIAGNOSIS — R05 Cough: Secondary | ICD-10-CM

## 2013-10-03 DIAGNOSIS — R059 Cough, unspecified: Secondary | ICD-10-CM | POA: Insufficient documentation

## 2013-10-03 NOTE — Telephone Encounter (Signed)
Patient aware.  Orders placed.  

## 2013-10-03 NOTE — Telephone Encounter (Signed)
pls cxr and if she can produce sputum, sputum for c/s. With chest burning enquire into reflux symptoms also pls, may end up needing something for that. cXR is ordered

## 2013-10-03 NOTE — Addendum Note (Signed)
Addended by: Kandis FantasiaSLADE, Rorey Hodges B on: 10/03/2013 01:16 PM   Modules accepted: Orders

## 2013-10-04 ENCOUNTER — Telehealth: Payer: Self-pay

## 2013-10-04 NOTE — Telephone Encounter (Signed)
Letter composed and faxed to # given by patient.   Attempted to call ahead before faxing.

## 2013-10-04 NOTE — Telephone Encounter (Signed)
pls type note  for her to return per her her request, since she is still calling with symptoms

## 2013-10-07 ENCOUNTER — Ambulatory Visit (HOSPITAL_COMMUNITY)
Admission: RE | Admit: 2013-10-07 | Discharge: 2013-10-07 | Disposition: A | Discharge status: Home / Self Care | Payer: No Typology Code available for payment source | Source: Ambulatory Visit | Attending: Family Medicine | Admitting: Family Medicine

## 2013-10-07 DIAGNOSIS — Z1231 Encounter for screening mammogram for malignant neoplasm of breast: Secondary | ICD-10-CM | POA: Insufficient documentation

## 2013-10-07 DIAGNOSIS — Z139 Encounter for screening, unspecified: Secondary | ICD-10-CM

## 2013-10-09 ENCOUNTER — Encounter: Payer: PRIVATE HEALTH INSURANCE | Admitting: Family Medicine

## 2013-10-19 DIAGNOSIS — N3000 Acute cystitis without hematuria: Secondary | ICD-10-CM | POA: Insufficient documentation

## 2013-10-19 NOTE — Progress Notes (Signed)
   Subjective:    Patient ID: Tanya Rogers, female    DOB: 12-05-1950, 63 y.o.   MRN: [id number]  HPI The PT is here for follow up and re-evaluation of chronic medical conditions, medication management and review of any available recent lab and radiology data.  Preventive health is updated, specifically  Cancer screening and Immunization.   5 day h/o chills and generalized aches, no fevr, but having facial pressure on right cheek with foul tasting post nasal drainage. C/o right sided ear pain , no drainage or hearing loss, no sore throat or productive cough 2 day h/o urinary frequency and  Mild dysuria, denies flank pain , nausea or emesis  Inconsistent attempts at lifestyle change with weight gain    Review of Systems See HPI . Denies chest pains, palpitations and leg swelling Denies abdominal pain, nausea, vomiting,diarrhea or constipation.   Denies joint pain, swelling and limitation in mobility. Denies headaches, seizures, numbness, or tingling. Denies depression, anxiety or insomnia. Denies skin break down or rash.        Objective:   Physical Exam BP 138/86  Pulse 62  Temp(Src) 98.6 F (37 C)  Resp 18  Ht 5\' 3"  (1.6 m)  Wt 202 lb (91.627 kg)  BMI 35.79 kg/m2  SpO2 95% Patient alert and oriented and in no cardiopulmonary distress.  HEENT: No facial asymmetry, EOMI, right maxillary  sinus tenderness,  oropharynx pink and moist.  Neck supple no adenopathy.Both tM clear with good light reflex  Chest: Clear to auscultation bilaterally.  CVS: S1, S2 no murmurs, no S3.  ABD: Soft non tender. Bowel sounds normal.No renal angle or supra pubic tenderness  Ext: No edema  MS: Adequate ROM spine, shoulders, hips and knees.  Skin: Intact, no ulcerations or rash noted.  Psych: Good eye contact, normal affect. Memory intact not anxious or depressed appearing.  CNS: CN 2-12 intact, power, tone and sensation normal throughout.        Assessment & Plan:  Left  maxillary sinusitis Antibiotic prescribed.    Acute cystitis Mildly symptomatic, urinalysis is normal, pt reassured no UTI  OBESITY Deteriorated. Patient re-educated about  the importance of commitment to a  minimum of 150 minutes of exercise per week. The importance of healthy food choices with portion control discussed. Encouraged to start a food diary, count calories and to consider  joining a support group. Sample diet sheets offered. Goals set by the patient for the next several months.     Prediabetes Unchanged Patient educated about the importance of limiting  Carbohydrate intake , the need to commit to daily physical activity for a minimum of 30 minutes , and to commit weight loss. The fact that changes in all these areas will reduce or eliminate all together the development of diabetes is stressed.

## 2013-10-19 NOTE — Assessment & Plan Note (Signed)
Mildly symptomatic, urinalysis is normal, pt reassured no UTI

## 2013-10-19 NOTE — Assessment & Plan Note (Signed)
Antibiotic prescribed 

## 2013-10-19 NOTE — Assessment & Plan Note (Signed)
Unchanged Patient educated about the importance of limiting  Carbohydrate intake , the need to commit to daily physical activity for a minimum of 30 minutes , and to commit weight loss. The fact that changes in all these areas will reduce or eliminate all together the development of diabetes is stressed.    

## 2013-10-19 NOTE — Assessment & Plan Note (Signed)
Deteriorated. Patient re-educated about  the importance of commitment to a  minimum of 150 minutes of exercise per week. The importance of healthy food choices with portion control discussed. Encouraged to start a food diary, count calories and to consider  joining a support group. Sample diet sheets offered. Goals set by the patient for the next several months.    

## 2014-01-28 ENCOUNTER — Ambulatory Visit: Payer: PRIVATE HEALTH INSURANCE | Admitting: Family Medicine

## 2014-03-12 ENCOUNTER — Ambulatory Visit: Payer: PRIVATE HEALTH INSURANCE | Admitting: Family Medicine

## 2014-12-16 ENCOUNTER — Encounter: Payer: PRIVATE HEALTH INSURANCE | Admitting: Family Medicine

## 2015-03-26 ENCOUNTER — Telehealth: Payer: Self-pay | Admitting: *Deleted

## 2015-03-26 NOTE — Telephone Encounter (Signed)
Pt called requesting to speak with Toni Amend, pt said she wants Toni Amend to call her back.

## 2015-03-27 NOTE — Telephone Encounter (Signed)
Labs ordered for Tanya Rogers per pt

## 2016-08-11 DIAGNOSIS — H401131 Primary open-angle glaucoma, bilateral, mild stage: Secondary | ICD-10-CM | POA: Diagnosis not present

## 2016-09-06 ENCOUNTER — Encounter: Payer: Self-pay | Admitting: Family Medicine

## 2016-09-06 ENCOUNTER — Ambulatory Visit (INDEPENDENT_AMBULATORY_CARE_PROVIDER_SITE_OTHER): Payer: Medicare Other | Admitting: Family Medicine

## 2016-09-06 VITALS — BP 120/78 | HR 66 | Resp 16 | Ht 63.0 in | Wt 192.0 lb

## 2016-09-06 DIAGNOSIS — R7303 Prediabetes: Secondary | ICD-10-CM

## 2016-09-06 DIAGNOSIS — Z1321 Encounter for screening for nutritional disorder: Secondary | ICD-10-CM

## 2016-09-06 DIAGNOSIS — Z23 Encounter for immunization: Secondary | ICD-10-CM

## 2016-09-06 DIAGNOSIS — Z1159 Encounter for screening for other viral diseases: Secondary | ICD-10-CM | POA: Diagnosis not present

## 2016-09-06 DIAGNOSIS — E669 Obesity, unspecified: Secondary | ICD-10-CM | POA: Diagnosis not present

## 2016-09-06 DIAGNOSIS — Z1231 Encounter for screening mammogram for malignant neoplasm of breast: Secondary | ICD-10-CM

## 2016-09-06 DIAGNOSIS — Z114 Encounter for screening for human immunodeficiency virus [HIV]: Secondary | ICD-10-CM | POA: Diagnosis not present

## 2016-09-06 DIAGNOSIS — R1900 Intra-abdominal and pelvic swelling, mass and lump, unspecified site: Secondary | ICD-10-CM | POA: Diagnosis not present

## 2016-09-06 DIAGNOSIS — Z2821 Immunization not carried out because of patient refusal: Secondary | ICD-10-CM

## 2016-09-06 DIAGNOSIS — R21 Rash and other nonspecific skin eruption: Secondary | ICD-10-CM

## 2016-09-06 DIAGNOSIS — Z6834 Body mass index (BMI) 34.0-34.9, adult: Secondary | ICD-10-CM | POA: Diagnosis not present

## 2016-09-06 DIAGNOSIS — E559 Vitamin D deficiency, unspecified: Secondary | ICD-10-CM | POA: Diagnosis not present

## 2016-09-06 DIAGNOSIS — Z1239 Encounter for other screening for malignant neoplasm of breast: Secondary | ICD-10-CM

## 2016-09-06 NOTE — Patient Instructions (Addendum)
4 to 6 weeks, call if you need me sooner Annual physical exam   pneumonia 13 , prevnar  Vaccine today, please reconsider flu vaccine, you need this also!  Fasting lipid, cmp, HBA1C, TSH, CBC, vit D , HIV , and Hep C screen today  You are referred for mammogram past due  You are referred for US of right flank due to concern of swelling and mass  For left leg swelling and darkening I recommend leg elevation and wearing hose and keeping salt intake down    Please work on good  health habits so that your health will improve. 1. Commitment to daily physical activity for 30 to 60  minutes, if you are able to do this.  2. Commitment to wise food choices. Aim for half of your  food intake to be vegetable and fruit, one quarter starchy foods, and one quarter protein. Try to eat on a regular schedule  3 meals per day, snacking between meals should be limited to vegetables or fruits or small portions of nuts. 64 ounces of water per day is generally recommended, unless you have specific health conditions, like heart failure or kidney failure where you will need to limit fluid intake.  3. Commitment to sufficient and a  good quality of physical and mental rest daily, generally between 6 to 8 hours per day.  WITH PERSISTANCE AND PERSEVERANCE, THE IMPOSSIBLE , BECOMES THE NORM!   Thank you  for choosing [facility]. We consider it a privelige to serve you.  Delivering excellent health care in a caring and  compassionate way is our goal.  Partnering with you,  so that together we can achieve this goal is our strategy.

## 2016-09-07 ENCOUNTER — Ambulatory Visit: Payer: PRIVATE HEALTH INSURANCE | Admitting: Family Medicine

## 2016-09-07 LAB — CBC
HEMATOCRIT: 40.4 % (ref 35.0–45.0)
HEMOGLOBIN: 13.1 g/dL (ref 11.7–15.5)
MCH: 29.6 pg (ref 27.0–33.0)
MCHC: 32.4 g/dL (ref 32.0–36.0)
MCV: 91.2 fL (ref 80.0–100.0)
MPV: 11.4 fL (ref 7.5–12.5)
Platelets: 206 10*3/uL (ref 140–400)
RBC: 4.43 MIL/uL (ref 3.80–5.10)
RDW: 14.1 % (ref 11.0–15.0)
WBC: 5.5 10*3/uL (ref 3.8–10.8)

## 2016-09-07 LAB — COMPREHENSIVE METABOLIC PANEL
ALBUMIN: 4 g/dL (ref 3.6–5.1)
ALK PHOS: 60 U/L (ref 33–130)
ALT: 18 U/L (ref 6–29)
AST: 20 U/L (ref 10–35)
BILIRUBIN TOTAL: 0.6 mg/dL (ref 0.2–1.2)
BUN: 17 mg/dL (ref 7–25)
CO2: 26 mmol/L (ref 20–31)
CREATININE: 0.93 mg/dL (ref 0.50–0.99)
Calcium: 9.3 mg/dL (ref 8.6–10.4)
Chloride: 106 mmol/L (ref 98–110)
Glucose, Bld: 79 mg/dL (ref 65–99)
Potassium: 4.3 mmol/L (ref 3.5–5.3)
Sodium: 142 mmol/L (ref 135–146)
Total Protein: 7 g/dL (ref 6.1–8.1)

## 2016-09-07 LAB — LIPID PANEL
Cholesterol: 123 mg/dL (ref <200)
HDL: 52 mg/dL (ref >50)
LDL Cholesterol: 58 mg/dL (ref <100)
Total CHOL/HDL Ratio: 2.4 Ratio (ref <5.0)
Triglycerides: 63 mg/dL (ref <150)
VLDL: 13 mg/dL (ref <30)

## 2016-09-07 LAB — TSH: TSH: 0.78 mIU/L

## 2016-09-07 LAB — HEMOGLOBIN A1C
Hgb A1c MFr Bld: 5.7 % — ABNORMAL HIGH (ref <5.7)
MEAN PLASMA GLUCOSE: 117 mg/dL

## 2016-09-07 LAB — HEPATITIS C ANTIBODY: HCV AB: NEGATIVE

## 2016-09-07 LAB — VITAMIN D 25 HYDROXY (VIT D DEFICIENCY, FRACTURES): Vit D, 25-Hydroxy: 26 ng/mL — ABNORMAL LOW (ref 30–100)

## 2016-09-07 LAB — HIV ANTIBODY (ROUTINE TESTING W REFLEX): HIV: NONREACTIVE

## 2016-09-09 ENCOUNTER — Ambulatory Visit (HOSPITAL_COMMUNITY)
Admission: RE | Admit: 2016-09-09 | Discharge: 2016-09-09 | Disposition: A | Discharge status: Home / Self Care | Payer: Medicare Other | Source: Ambulatory Visit | Attending: Family Medicine | Admitting: Family Medicine

## 2016-09-09 ENCOUNTER — Other Ambulatory Visit: Payer: Self-pay | Admitting: Family Medicine

## 2016-09-09 DIAGNOSIS — R1909 Other intra-abdominal and pelvic swelling, mass and lump: Secondary | ICD-10-CM | POA: Diagnosis not present

## 2016-09-09 DIAGNOSIS — R1900 Intra-abdominal and pelvic swelling, mass and lump, unspecified site: Secondary | ICD-10-CM | POA: Insufficient documentation

## 2016-09-09 DIAGNOSIS — Z1231 Encounter for screening mammogram for malignant neoplasm of breast: Secondary | ICD-10-CM

## 2016-09-11 ENCOUNTER — Encounter: Payer: Self-pay | Admitting: Family Medicine

## 2016-09-11 DIAGNOSIS — E559 Vitamin D deficiency, unspecified: Secondary | ICD-10-CM | POA: Insufficient documentation

## 2016-09-11 DIAGNOSIS — Z2821 Immunization not carried out because of patient refusal: Secondary | ICD-10-CM | POA: Insufficient documentation

## 2016-09-11 DIAGNOSIS — R1900 Intra-abdominal and pelvic swelling, mass and lump, unspecified site: Secondary | ICD-10-CM | POA: Insufficient documentation

## 2016-09-11 DIAGNOSIS — Z23 Encounter for immunization: Secondary | ICD-10-CM | POA: Insufficient documentation

## 2016-09-11 NOTE — Assessment & Plan Note (Signed)
Initially stated that she would get the vaccine , after much education, however, when nurse went to administer vaccine, she refused

## 2016-09-11 NOTE — Assessment & Plan Note (Signed)
Improved Patient educated about the importance of limiting  Carbohydrate intake , the need to commit to daily physical activity for a minimum of 30 minutes , and to commit weight loss. The fact that changes in all these areas will reduce or eliminate all together the development of diabetes is stressed.   Diabetic Labs Latest Ref Rng & Units 09/06/2016 09/30/2013 05/17/2013 03/22/2013 08/23/2012  HbA1c <5.7 % 5.7(H) 6.0(H) 6.0(H) - 6.0(H)  Chol <200 mg/dL 811123 - - 914125 -  HDL >78>50 mg/dL 52 - - 47 -  Calc LDL <295<100 mg/dL 58 - - 67 -  Triglycerides <150 mg/dL 63 - - 57 -  Creatinine 0.50 - 0.99 mg/dL 6.210.93 - - 3.080.99 6.570.80   BP/Weight 09/06/2016 09/30/2013 05/10/2013 08/23/2012 09/05/2011 08/17/2011 08/11/2010  Systolic BP 120 138 130 150 120 134 126  Diastolic BP 78 86 82 84 72 84 78  Wt. (Lbs) 192 202 196.12 196.08 200.12 203.4 199  BMI 34.01 35.79 34.75 34.74 35.46 36.04 35.26   No flowsheet data found.

## 2016-09-11 NOTE — Assessment & Plan Note (Signed)
Updated lab shows mild deficiency, otc daily supplement recommended

## 2016-09-11 NOTE — Assessment & Plan Note (Addendum)
Improved Patient re-educated about  the importance of commitment to a  minimum of 150 minutes of exercise per week.  The importance of healthy food choices with portion control discussed. Encouraged to start a food diary, count calories and to consider  joining a support group. Sample diet sheets offered. Goals set by the patient for the next several months.   Weight /BMI 09/06/2016 09/30/2013 05/10/2013  WEIGHT 192 lb 202 lb 196 lb 1.9 oz  HEIGHT 5\' 3"  5\' 3"  -  BMI 34.01 kg/m2 35.79 kg/m2 34.75 kg/m2

## 2016-09-11 NOTE — Assessment & Plan Note (Signed)
Hyperpigmentation of skin on distal anterior right leg, likely due to venous stasis, leg elevation and use of compression hose/ support socks encouraged

## 2016-09-11 NOTE — Assessment & Plan Note (Signed)
After obtaining informed consent, the vaccine is  administered by LPN.  

## 2016-09-11 NOTE — Progress Notes (Signed)
Tanya Rogers     MRN: [id number]      DOB: 07/20/1951   HPI Tanya Rogers is here for follow up and to re establish care. C/o "mass" in right flank noted in December,that has her worried. No trauma, hematuria , tender to palpation. C/o right lower extremity pain and discoloration of the skin since last Summer, was evaluated at urgent care and even had a negative venous doppler sturdy to evaluate States now on medicare and able to get the health care that she needs Remains active, and eats primarily plant based food ROS Denies recent fever or chills. Denies sinus pressure, nasal congestion, ear pain or sore throat. Denies chest congestion, productive cough or wheezing. Denies chest pains, palpitations and leg swelling Denies abdominal pain, nausea, vomiting,diarrhea or constipation.   Denies dysuria, frequency, hesitancy or incontinence. Denies joint pain, swelling and limitation in mobility. Denies headaches, seizures, numbness, or tingling. Denies depression, anxiety or insomnia.  PE  BP 120/78   Pulse 66   Resp 16   Ht 5\' 3"  (1.6 m)   Wt 192 lb (87.1 kg)   SpO2 98%   BMI 34.01 kg/m   Patient alert and oriented and in no cardiopulmonary distress.  HEENT: No facial asymmetry, EOMI,   oropharynx pink and moist.  Neck supple no JVD, no mass.  Chest: Clear to auscultation bilaterally.  CVS: S1, S2 no murmurs, no S3.Regular rate.  ABD: Soft non tender. No palpable organomegaly  mass, including in area of patient's concern, normal BS  Ext: No edema  MS: Adequate ROM spine, shoulders, hips and knees.  Skin: Intact, no ulcerations, hyperpigmented macular  Rash on distal aspect of right leg  noted.  Psych: Good eye contact, normal affect. Memory intact not anxious or depressed appearing.  CNS: CN 2-12 intact, power,  normal throughout.no focal deficits noted.   Assessment & Plan  Prediabetes Improved Patient educated about the importance of limiting   Carbohydrate intake , the need to commit to daily physical activity for a minimum of 30 minutes , and to commit weight loss. The fact that changes in all these areas will reduce or eliminate all together the development of diabetes is stressed.   Diabetic Labs Latest Ref Rng & Units 09/06/2016 09/30/2013 05/17/2013 03/22/2013 08/23/2012  HbA1c <5.7 % 5.7(H) 6.0(H) 6.0(H) - 6.0(H)  Chol <200 mg/dL 409 - - 811 -  HDL >91 mg/dL 52 - - 47 -  Calc LDL <478 mg/dL 58 - - 67 -  Triglycerides <150 mg/dL 63 - - 57 -  Creatinine 0.50 - 0.99 mg/dL 2.95 - - 6.21 3.08   BP/Weight 09/06/2016 09/30/2013 05/10/2013 08/23/2012 09/05/2011 08/17/2011 08/11/2010  Systolic BP 120 138 130 150 120 134 126  Diastolic BP 78 86 82 84 72 84 78  Wt. (Lbs) 192 202 196.12 196.08 200.12 203.4 199  BMI 34.01 35.79 34.75 34.74 35.46 36.04 35.26   No flowsheet data found.    Need for vaccination with 13-polyvalent pneumococcal conjugate vaccine After obtaining informed consent, the vaccine is  administered by LPN.   Obesity Improved Patient re-educated about  the importance of commitment to a  minimum of 150 minutes of exercise per week.  The importance of healthy food choices with portion control discussed. Encouraged to start a food diary, count calories and to consider  joining a support group. Sample diet sheets offered. Goals set by the patient for the next several months.   Weight /BMI 09/06/2016 09/30/2013 05/10/2013  WEIGHT  192 lb 202 lb 196 lb 1.9 oz  HEIGHT 5\' 3"  5\' 3"  -  BMI 34.01 kg/m2 35.79 kg/m2 34.75 kg/m2      Rash and nonspecific skin eruption Hyperpigmentation of skin on distal anterior right leg, likely due to venous stasis, leg elevation and use of compression hose/ support socks encouraged  Vitamin D deficiency Updated lab shows mild deficiency, otc daily supplement recommended  Right flank mass Pt complaint x several months, no palpable mass , only probable lipoma,on my exam will refer for  ultrasound for further evaluation  Influenza vaccine refused Initially stated that she would get the vaccine , after much education, however, when nurse went to administer vaccine, she refused

## 2016-09-11 NOTE — Assessment & Plan Note (Signed)
Pt complaint x several months, no palpable mass , only probable lipoma,on my exam will refer for ultrasound for further evaluation

## 2016-09-19 ENCOUNTER — Ambulatory Visit (HOSPITAL_COMMUNITY)
Admission: RE | Admit: 2016-09-19 | Discharge: 2016-09-19 | Disposition: A | Discharge status: Home / Self Care | Payer: Medicare Other | Source: Ambulatory Visit | Attending: Family Medicine | Admitting: Family Medicine

## 2016-09-19 DIAGNOSIS — Z1231 Encounter for screening mammogram for malignant neoplasm of breast: Secondary | ICD-10-CM | POA: Insufficient documentation

## 2016-10-18 ENCOUNTER — Encounter: Payer: Medicare Other | Admitting: Family Medicine

## 2016-11-03 ENCOUNTER — Encounter: Payer: Self-pay | Admitting: Family Medicine

## 2016-11-03 ENCOUNTER — Ambulatory Visit (HOSPITAL_COMMUNITY)
Admission: RE | Admit: 2016-11-03 | Discharge: 2016-11-03 | Disposition: A | Discharge status: Home / Self Care | Payer: Medicare Other | Source: Ambulatory Visit | Attending: Family Medicine | Admitting: Family Medicine

## 2016-11-03 ENCOUNTER — Other Ambulatory Visit (HOSPITAL_COMMUNITY)
Admission: RE | Admit: 2016-11-03 | Discharge: 2016-11-03 | Disposition: A | Discharge status: Home / Self Care | Payer: Medicare Other | Source: Ambulatory Visit | Attending: Family Medicine | Admitting: Family Medicine

## 2016-11-03 ENCOUNTER — Ambulatory Visit (INDEPENDENT_AMBULATORY_CARE_PROVIDER_SITE_OTHER): Payer: Medicare Other | Admitting: Family Medicine

## 2016-11-03 VITALS — BP 124/84 | HR 82 | Resp 16 | Ht 63.0 in | Wt 195.0 lb

## 2016-11-03 DIAGNOSIS — Z124 Encounter for screening for malignant neoplasm of cervix: Secondary | ICD-10-CM

## 2016-11-03 DIAGNOSIS — Z0000 Encounter for general adult medical examination without abnormal findings: Secondary | ICD-10-CM

## 2016-11-03 DIAGNOSIS — Z1211 Encounter for screening for malignant neoplasm of colon: Secondary | ICD-10-CM

## 2016-11-03 DIAGNOSIS — Z01419 Encounter for gynecological examination (general) (routine) without abnormal findings: Secondary | ICD-10-CM | POA: Diagnosis not present

## 2016-11-03 DIAGNOSIS — M546 Pain in thoracic spine: Secondary | ICD-10-CM | POA: Insufficient documentation

## 2016-11-03 LAB — POC HEMOCCULT BLD/STL (OFFICE/1-CARD/DIAGNOSTIC): Fecal Occult Blood, POC: NEGATIVE

## 2016-11-03 NOTE — Patient Instructions (Addendum)
Welcome to medicare in 6 month, call if you need me sooner  Start once daily vit D3 1000 IU once daily  Start aspirin 81 mg coated one daily for stroke risk reduction  Please get x ray of back today  Due to burning pain, if this is normal I will refer you to stomach Doc if you wish   It is important that you exercise regularly at least 30 minutes 5 times a week. If you develop chest pain, have severe difficulty breathing, or feel very tired, stop exercising immediately and seek medical attention   Please work on good  health habits so that your health will improve. 1. Commitment to daily physical activity for 30 to 60  minutes, if you are able to do this.  2. Commitment to wise food choices. Aim for half of your  food intake to be vegetable and fruit, one quarter starchy foods, and one quarter protein. Try to eat on a regular schedule  3 meals per day, snacking between meals should be limited to vegetables or fruits or small portions of nuts. 64 ounces of water per day is generally recommended, unless you have specific health conditions, like heart failure or kidney failure where you will need to limit fluid intake.  3. Commitment to sufficient and a  good quality of physical and mental rest daily, generally between 6 to 8 hours per day.  WITH PERSISTANCE AND PERSEVERANCE, THE IMPOSSIBLE , BECOMES THE NORM!

## 2016-11-04 LAB — CYTOLOGY - PAP: Diagnosis: NEGATIVE

## 2016-11-04 NOTE — Assessment & Plan Note (Signed)
Discomfort from spine around to epigastrium bilaterally, imaging of thoracic spine to evaluate for underlying pathology, if negative offer of GI eval as this is a persistent concern

## 2016-11-04 NOTE — Progress Notes (Signed)
    Artist BeachDevern G Frederic     MRN: [id number]      DOB: 1951-05-09  HPI: Patient is in for annual physical exam. C/i discomfort in epigastrium and radiating around both left and right upper abdomen to back, unchanged, still worried about the"big C" Recent labs, if available are reviewed. Immunization is reviewed , and  updated if needed.   PE: BP 124/84   Pulse 82   Resp 16   Ht 5\' 3"  (1.6 m)   Wt 195 lb (88.5 kg)   SpO2 100%   BMI 34.54 kg/m   Pleasant  female, alert and oriented x 3, in no cardio-pulmonary distress. Afebrile. HEENT No facial trauma or asymetry. Sinuses non tender.  Extra occullar muscles intact, pupils equally reactive to light. External ears normal, tympanic membranes clear. Oropharynx moist, no exudate. Neck: supple, no adenopathy,JVD or thyromegaly.No bruits.  Chest: Clear to ascultation bilaterally.No crackles or wheezes. Non tender to palpation  Breast: No asymetry,no masses or lumps. No tenderness. No nipple discharge or inversion. No axillary or supraclavicular adenopathy  Cardiovascular system; Heart sounds normal,  S1 and  S2 ,no S3.  No murmur, or thrill. Apical beat not displaced Peripheral pulses normal.  Abdomen: Soft, non tender, no organomegaly or masses. No bruits. Bowel sounds normal. No guarding, tenderness or rebound.  Rectal:  Normal sphincter tone. No rectal mass. Guaiac negative stool.  GU: External genitalia normal female genitalia , normal female distribution of hair. No lesions. Urethral meatus normal in size, no  Prolapse, no lesions visibly  Present. Bladder non tender. Vagina pink and moist , with no visible lesions , discharge present . Adequate pelvic support no  cystocele or rectocele noted Cervix pink and appears healthy, no lesions or ulcerations noted, no discharge noted from os Uterus normal size, no adnexal masses, no cervical motion or adnexal tenderness.   Musculoskeletal exam: Full ROM of spine,  hips , shoulders and knees. No deformity ,swelling or crepitus noted. No muscle wasting or atrophy.   Neurologic: Cranial nerves 2 to 12 intact. Power, tone ,sensation and reflexes normal throughout. No disturbance in gait. No tremor.  Skin: Intact, no ulceration, erythema , scaling or rash noted. Pigmentation normal throughout  Psych; Normal mood and affect. Judgement and concentration normal   Assessment & Plan:  Annual physical exam Annual exam as documented. Counseling done  re healthy lifestyle involving commitment to 150 minutes exercise per week, heart healthy diet, and attaining healthy weight.The importance of adequate sleep also discussed. Regular seat belt use and home safety, is also discussed. Changes in health habits are decided on by the patient with goals and time frames  set for achieving them. Immunization and cancer screening needs are specifically addressed at this visit.   Thoracic back pain Discomfort from spine around to epigastrium bilaterally, imaging of thoracic spine to evaluate for underlying pathology, if negative offer of GI eval as this is a persistent concern

## 2016-11-04 NOTE — Assessment & Plan Note (Signed)

## 2016-12-09 ENCOUNTER — Encounter (INDEPENDENT_AMBULATORY_CARE_PROVIDER_SITE_OTHER): Payer: Self-pay | Admitting: Internal Medicine

## 2016-12-16 DIAGNOSIS — J09X2 Influenza due to identified novel influenza A virus with other respiratory manifestations: Secondary | ICD-10-CM | POA: Diagnosis not present

## 2016-12-16 DIAGNOSIS — R05 Cough: Secondary | ICD-10-CM | POA: Diagnosis not present

## 2016-12-16 DIAGNOSIS — J019 Acute sinusitis, unspecified: Secondary | ICD-10-CM | POA: Diagnosis not present

## 2016-12-16 DIAGNOSIS — J209 Acute bronchitis, unspecified: Secondary | ICD-10-CM | POA: Diagnosis not present

## 2016-12-20 ENCOUNTER — Encounter (INDEPENDENT_AMBULATORY_CARE_PROVIDER_SITE_OTHER): Payer: Self-pay | Admitting: Internal Medicine

## 2016-12-20 ENCOUNTER — Ambulatory Visit (INDEPENDENT_AMBULATORY_CARE_PROVIDER_SITE_OTHER): Payer: Medicare Other | Admitting: Internal Medicine

## 2016-12-20 VITALS — BP 120/76 | HR 72 | Temp 98.1°F | Ht 64.0 in | Wt 192.6 lb

## 2016-12-20 DIAGNOSIS — G8929 Other chronic pain: Secondary | ICD-10-CM

## 2016-12-20 DIAGNOSIS — R1011 Right upper quadrant pain: Secondary | ICD-10-CM | POA: Diagnosis not present

## 2016-12-20 LAB — HEPATIC FUNCTION PANEL
ALK PHOS: 62 U/L (ref 33–130)
ALT: 34 U/L — AB (ref 6–29)
AST: 31 U/L (ref 10–35)
Albumin: 3.8 g/dL (ref 3.6–5.1)
BILIRUBIN INDIRECT: 0.4 mg/dL (ref 0.2–1.2)
Bilirubin, Direct: 0.1 mg/dL (ref <=0.2)
Total Bilirubin: 0.5 mg/dL (ref 0.2–1.2)
Total Protein: 7 g/dL (ref 6.1–8.1)

## 2016-12-20 NOTE — Patient Instructions (Signed)
US abdomen Hepatic function 

## 2016-12-20 NOTE — Progress Notes (Addendum)
Subjective:    Patient ID: Tanya Rogers, female    DOB: June 16, 1951, 66 y.o.   MRN: [id number]  HPI Referred by Dr. Lodema Hong for abdominal pain.  She presents with c/o rt lateral rib pain. Symptoms for several months.  When she lies on her rt side she has pain under her rt ribs.  She says it really is not a pain, it is a discomfort. Sometimes it radiates into her back.  She feels the discomfort more when she is lying on her rt side. Sometimes she has a burning sensation in her rt upper quadrant. Sometimes she will take Ibuprofen and relieves the burning. Her appetite is good. No weight loss. BMs are normal. No melena or BRRB.   No foods in particular bother her. No dysphagia.  Last colonoscopy was 04/15/2008 (screening) and was normal. (Dr. Karilyn Cota) 11/03/2016  DG Thoracic spine w/swimmers.  IMPRESSION: There is no acute or significant chronic bony abnormality of the thoracic spine. She has had symptoms since November. Denies any trauma.   09/06/2016 Hep C Ab negative 11/03/2016 Fecal occult blood negative.  CBC    Component Value Date/Time   WBC 5.5 09/06/2016 1433   RBC 4.43 09/06/2016 1433   HGB 13.1 09/06/2016 1433   HCT 40.4 09/06/2016 1433   PLT 206 09/06/2016 1433   MCV 91.2 09/06/2016 1433   MCH 29.6 09/06/2016 1433   MCHC 32.4 09/06/2016 1433   RDW 14.1 09/06/2016 1433   LYMPHSABS 2.4 09/30/2013 1254   MONOABS 0.6 09/30/2013 1254   EOSABS 0.2 09/30/2013 1254   BASOSABS 0.0 09/30/2013 1254   CMP     Component Value Date/Time   NA 142 09/06/2016 1433   K 4.3 09/06/2016 1433   CL 106 09/06/2016 1433   CO2 26 09/06/2016 1433   GLUCOSE 79 09/06/2016 1433   BUN 17 09/06/2016 1433   CREATININE 0.93 09/06/2016 1433   CALCIUM 9.3 09/06/2016 1433   PROT 7.0 09/06/2016 1433   ALBUMIN 4.0 09/06/2016 1433   AST 20 09/06/2016 1433   ALT 18 09/06/2016 1433   ALKPHOS 60 09/06/2016 1433   BILITOT 0.6 09/06/2016 1433      Review of Systems Past Medical History:    Diagnosis Date  . Glaucoma 2003 approx  . Obesity     Past Surgical History:  Procedure Laterality Date  . EYE SURGERY Bilateral 12/2012 , 02/2013   [place]  . TUBAL LIGATION      No Known Allergies  Current Outpatient Prescriptions on File Prior to Visit  Medication Sig Dispense Refill  . Calcium Carb-Cholecalciferol (CALCIUM 1000 + D PO) Take 2 tablets by mouth daily.      . fish oil-omega-3 fatty acids 1000 MG capsule Take 2 g by mouth daily.      . Multiple Vitamin (MULTIVITAMIN) capsule Take 1 capsule by mouth daily.       No current facility-administered medications on file prior to visit.        Objective:   Physical Exam Blood pressure 120/76, pulse 72, temperature 98.1 F (36.7 C), height  (1.626 m), weight 192 lb 9.6 oz (87.4 kg).  Alert and oriented. Skin warm and dry. Oral mucosa is moist.   . Sclera anicteric, conjunctivae is pink. Thyroid not enlarged. No cervical lymphadenopathy. Lungs clear. Heart regular rate and rhythm.  Abdomen is soft. Bowel sounds are positive. No hepatomegaly. No abdominal masses felt. No tenderness.  No edema to lower extremities.  Assessment & Plan:  RT upper quadrant discomfort.  US abdomen. Hepatic function.  If normal will get a HIDA scan.  Patient is in agreement with plan.

## 2016-12-22 DIAGNOSIS — H401131 Primary open-angle glaucoma, bilateral, mild stage: Secondary | ICD-10-CM | POA: Diagnosis not present

## 2016-12-22 DIAGNOSIS — Z961 Presence of intraocular lens: Secondary | ICD-10-CM | POA: Diagnosis not present

## 2016-12-23 ENCOUNTER — Ambulatory Visit (HOSPITAL_COMMUNITY)
Admission: RE | Admit: 2016-12-23 | Discharge: 2016-12-23 | Disposition: A | Discharge status: Home / Self Care | Payer: Medicare Other | Source: Ambulatory Visit | Attending: Internal Medicine | Admitting: Internal Medicine

## 2016-12-23 DIAGNOSIS — N261 Atrophy of kidney (terminal): Secondary | ICD-10-CM | POA: Diagnosis not present

## 2016-12-23 DIAGNOSIS — G8929 Other chronic pain: Secondary | ICD-10-CM | POA: Diagnosis not present

## 2016-12-23 DIAGNOSIS — R1011 Right upper quadrant pain: Secondary | ICD-10-CM | POA: Diagnosis not present

## 2016-12-28 ENCOUNTER — Telehealth (INDEPENDENT_AMBULATORY_CARE_PROVIDER_SITE_OTHER): Payer: Self-pay | Admitting: Internal Medicine

## 2016-12-28 DIAGNOSIS — R1011 Right upper quadrant pain: Secondary | ICD-10-CM

## 2016-12-28 NOTE — Telephone Encounter (Signed)
HIDA scan sch'd 01/02/17 at 10 (945), npo after midnight, no pain meds, left detailed message

## 2016-12-28 NOTE — Telephone Encounter (Signed)
Tanya Rogers, HIDA scan 

## 2017-01-02 ENCOUNTER — Encounter (HOSPITAL_COMMUNITY)
Admission: RE | Admit: 2017-01-02 | Discharge: 2017-01-02 | Disposition: A | Discharge status: Home / Self Care | Payer: Medicare Other | Source: Ambulatory Visit | Attending: Internal Medicine | Admitting: Internal Medicine

## 2017-01-02 ENCOUNTER — Encounter (HOSPITAL_COMMUNITY): Payer: Self-pay

## 2017-01-02 DIAGNOSIS — R1011 Right upper quadrant pain: Secondary | ICD-10-CM | POA: Insufficient documentation

## 2017-01-02 MED ORDER — TECHNETIUM TC 99M MEBROFENIN IV KIT
5.0000 | PACK | Freq: Once | INTRAVENOUS | Status: AC | PRN
Start: 2017-01-02 — End: 2017-01-02
  Administered 2017-01-02: 5.5 via INTRAVENOUS

## 2017-01-05 ENCOUNTER — Telehealth (INDEPENDENT_AMBULATORY_CARE_PROVIDER_SITE_OTHER): Payer: Self-pay | Admitting: Internal Medicine

## 2017-01-05 DIAGNOSIS — R948 Abnormal results of function studies of other organs and systems: Secondary | ICD-10-CM

## 2017-01-05 NOTE — Telephone Encounter (Signed)
Referral placed I [facility], Dr Lovell SheehanJenkins office will contact patient to schedule

## 2017-01-05 NOTE — Telephone Encounter (Signed)
Referral to Dr. Jenkins 

## 2017-01-05 NOTE — Telephone Encounter (Signed)
Tanya Rogers, referral to Dr. Lovell SheehanJenkins.

## 2017-01-19 ENCOUNTER — Ambulatory Visit (INDEPENDENT_AMBULATORY_CARE_PROVIDER_SITE_OTHER): Payer: Medicare Other | Admitting: General Surgery

## 2017-01-19 ENCOUNTER — Encounter: Payer: Self-pay | Admitting: General Surgery

## 2017-01-19 VITALS — BP 151/63 | HR 61 | Temp 97.8°F | Resp 18 | Ht 65.0 in | Wt 147.0 lb

## 2017-01-19 DIAGNOSIS — K811 Chronic cholecystitis: Secondary | ICD-10-CM

## 2017-01-19 NOTE — Patient Instructions (Signed)
Laparoscopic Cholecystectomy Laparoscopic cholecystectomy is surgery to remove the gallbladder. The gallbladder is a pear-shaped organ that lies beneath the liver on the right side of the body. The gallbladder stores bile, which is a fluid that helps the body to digest fats. Cholecystectomy is often done for inflammation of the gallbladder (cholecystitis). This condition is usually caused by a buildup of gallstones (cholelithiasis) in the gallbladder. Gallstones can block the flow of bile, which can result in inflammation and pain. In severe cases, emergency surgery may be required. This procedure is done though small incisions in your abdomen (laparoscopic surgery). A thin scope with a camera (laparoscope) is inserted through one incision. Thin surgical instruments are inserted through the other incisions. In some cases, a laparoscopic procedure may be turned into a type of surgery that is done through a larger incision (open surgery). Tell a health care provider about:  Any allergies you have.  All medicines you are taking, including vitamins, herbs, eye drops, creams, and over-the-counter medicines.  Any problems you or family members have had with anesthetic medicines.  Any blood disorders you have.  Any surgeries you have had.  Any medical conditions you have.  Whether you are pregnant or may be pregnant. What are the risks? Generally, this is a safe procedure. However, problems may occur, including:  Infection.  Bleeding.  Allergic reactions to medicines.  Damage to other structures or organs.  A stone remaining in the common bile duct. The common bile duct carries bile from the gallbladder into the small intestine.  A bile leak from the cyst duct that is clipped when your gallbladder is removed. What happens before the procedure? Staying hydrated  Follow instructions from your health care provider about hydration, which may include:  Up to 2 hours before the procedure - you  may continue to drink clear liquids, such as water, clear fruit juice, black coffee, and plain tea. Eating and drinking restrictions  Follow instructions from your health care provider about eating and drinking, which may include:  8 hours before the procedure - stop eating heavy meals or foods such as meat, fried foods, or fatty foods.  6 hours before the procedure - stop eating light meals or foods, such as toast or cereal.  6 hours before the procedure - stop drinking milk or drinks that contain milk.  2 hours before the procedure - stop drinking clear liquids. Medicines   Ask your health care provider about:  Changing or stopping your regular medicines. This is especially important if you are taking diabetes medicines or blood thinners.  Taking medicines such as aspirin and ibuprofen. These medicines can thin your blood. Do not take these medicines before your procedure if your health care provider instructs you not to.  You may be given antibiotic medicine to help prevent infection. General instructions   Let your health care provider know if you develop a cold or an infection before surgery.  Plan to have someone take you home from the hospital or clinic.  Ask your health care provider how your surgical site will be marked or identified. What happens during the procedure?  To reduce your risk of infection:  Your health care team will wash or sanitize their hands.  Your skin will be washed with soap.  Hair may be removed from the surgical area.  An IV tube may be inserted into one of your veins.  You will be given one or more of the following:  A medicine to help you relax (  sedative).  A medicine to make you fall asleep (general anesthetic).  A breathing tube will be placed in your mouth.  Your surgeon will make several small cuts (incisions) in your abdomen.  The laparoscope will be inserted through one of the small incisions. The camera on the laparoscope will  send images to a TV screen (monitor) in the operating room. This lets your surgeon see inside your abdomen.  Air-like gas will be pumped into your abdomen. This will expand your abdomen to give the surgeon more room to perform the surgery.  Other tools that are needed for the procedure will be inserted through the other incisions. The gallbladder will be removed through one of the incisions.  Your common bile duct may be examined. If stones are found in the common bile duct, they may be removed.  After your gallbladder has been removed, the incisions will be closed with stitches (sutures), staples, or skin glue.  Your incisions may be covered with a bandage (dressing). The procedure may vary among health care providers and hospitals. What happens after the procedure?  Your blood pressure, heart rate, breathing rate, and blood oxygen level will be monitored until the medicines you were given have worn off.  You will be given medicines as needed to control your pain.  Do not drive for 24 hours if you were given a sedative. This information is not intended to replace advice given to you by your health care provider. Make sure you discuss any questions you have with your health care provider. Document Released: 08/15/2005 Document Revised: 03/06/2016 Document Reviewed: 02/01/2016 Elsevier Interactive Patient Education  2017 [employer]. Biliary Colic, Adult Biliary colic is severe pain caused by a problem with a small organ in the upper right part of your belly (gallbladder). The gallbladder stores a digestive fluid produced in the liver (bile) that helps the body break down fat. Bile and other digestive enzymes are carried from the liver to the small intestine though tube-like structures (bile ducts). The gallbladder and the bile ducts form the biliary tract. Sometimes hard deposits of digestive fluids form in the gallbladder (gallstones) and block the flow of bile from the gallbladder,  causing biliary colic. This condition is also called a gallbladder attack. Gallstones can be as small as a grain of sand or as big as a golf ball. There could be just one gallstone in the gallbladder, or there could be many. What are the causes? Biliary colic is usually caused by gallstones. Less often, a tumor could block the flow of bile from the gallbladder and trigger biliary colic. What increases the risk? This condition is more likely to develop in:  Women.  People of Hispanic descent.  People with a family history of gallstones.  People who are obese.  People who suddenly or quickly lose weight.  People who eat a high-calorie, low-fiber diet that is rich in refined carbs (carbohydrates), such as white bread and white rice.  People who have an intestinal disease that affects nutrient absorption, such as Crohn disease.  People who have a metabolic condition, such as metabolic syndrome or diabetes. What are the signs or symptoms? Severe pain in the upper right side of the belly is the main symptom of biliary colic. You may feel this pain below the chest but above the hip. This pain often occurs at night or after eating a very fatty meal. This pain may get worse for up to an hour and last as long as 12 hours. In  most cases, the pain fades (subsides) within a couple hours. Other symptoms of this condition include:  Nausea and vomiting.  Pain under the right shoulder. How is this diagnosed? This condition is diagnosed based on your medical history, your symptoms, and a physical exam. You may have tests, including:  Blood tests to rule out infection or inflammation of the bile ducts, gallbladder, pancreas, or liver.  Imaging studies such as:  Ultrasound.  CT scan.  MRI. In some cases, you may need to have an imaging study done using a small amount of radioactive material (nuclear medicine) to confirm the diagnosis. How is this treated? Treatment for this condition may  include medicine to relieve your pain or nausea. If you have gallstones that are causing biliary colic, you may need surgery to remove the gallbladder (cholecystectomy). Gallstones can also be dissolved gradually with medicine. It may take months or years before the gallstones are completely gone. Follow these instructions at home:  Take over-the-counter and prescription medicines only as told by your health care provider.  Drink enough fluid to keep your urine clear or pale yellow.  Follow instructions from your health care provider about eating or drinking restrictions. These may include avoiding:  Fatty, greasy, and fried foods.  Any foods that make the pain worse.  Overeating.  Having a large meal after not eating for a while.  Keep all follow-up visits as told by your health care provider. This is important. How is this prevented? Steps to prevent this condition include:  Maintaining a healthy body weight.  Getting regular exercise.  Eating a healthy, high-fiber, low-fat diet.  Limiting how much sugar and refined carbs you eat, such as sweets, white flour, and white rice. Contact a health care provider if:  Your pain lasts more than 5 hours.  You vomit.  You have a fever and chills.  Your pain gets worse. Get help right away if:  Your skin or the whites of your eyes look yellow (jaundice).  Your have tea-colored urine and light-colored stools.  You are dizzy or you faint. This information is not intended to replace advice given to you by your health care provider. Make sure you discuss any questions you have with your health care provider. Document Released: 01/16/2006 Document Revised: 04/12/2016 Document Reviewed: 02/29/2016 Elsevier Interactive Patient Education  2017 [employer]

## 2017-01-19 NOTE — Progress Notes (Signed)
Tanya Rogers; [id number]; [birth year]   HPI   Patient is a 66 year old black female who was referred to my care for evaluation and treatment of chronic cholecystitis.  She has had right flank pain intermittently for many months.  She currently has no pain.  She was referred by Dr. Patty Sermonsehman's office for further evaluation and treatment of her abdominal pain.  She denies any fever, chills, or fatty food intolerance.  She does get bloated and belching seems to relieve some of her symptoms.  She had a biliary scan which was positive for a low gallbladder ejection fraction and reproducible symptoms with fatty meal.  0/10 pain currently. Past Medical History:  Diagnosis Date  . Glaucoma 2003 approx  . Obesity     Past Surgical History:  Procedure Laterality Date  . EYE SURGERY Bilateral 12/2012 , 02/2013   [place]  . TUBAL LIGATION      Family History  Problem Relation Age of Onset  . Heart disease Mother   . Hypertension Mother   . Stroke Mother   . Diabetes Mother   . Diabetes Father   . Hypertension Father     Current Outpatient Prescriptions on File Prior to Visit  Medication Sig Dispense Refill  . Calcium Carb-Cholecalciferol (CALCIUM 1000 + D PO) Take 2 tablets by mouth daily.      Marland Kitchen. doxycycline (VIBRAMYCIN) 100 MG capsule Take 100 mg by mouth 2 (two) times daily.    . fish oil-omega-3 fatty acids 1000 MG capsule Take 2 g by mouth daily.      . Multiple Vitamin (MULTIVITAMIN) capsule Take 1 capsule by mouth daily.      . timolol (BETIMOL) 0.5 % ophthalmic solution 1 drop 2 (two) times daily.     No current facility-administered medications on file prior to visit.     No Known Allergies  History  Alcohol use Not on file    History  Smoking Status  . Never Smoker  Smokeless Tobacco  . Never Used    Review of Systems  Constitutional: Positive for malaise/fatigue.  HENT: Negative.   Eyes: Negative.   Respiratory: Negative.   Cardiovascular: Negative.    Gastrointestinal: Positive for abdominal pain.  Genitourinary: Negative.   Musculoskeletal: Positive for back pain.  Skin: Negative.   Neurological: Negative.   Endo/Heme/Allergies: Negative.   Psychiatric/Behavioral: Negative.     Objective   Vitals:   01/19/17 1112  BP: (!) 151/63  Pulse: 61  Resp: 18  Temp: 97.8 F (36.6 C)    Physical Exam  Constitutional: She is oriented to person, place, and time and well-developed, well-nourished, and in no distress.  HENT:  Head: Normocephalic and atraumatic.  Eyes: No scleral icterus.  Neck: Normal range of motion. Neck supple.  Cardiovascular: Normal rate, regular rhythm and normal heart sounds.   Pulmonary/Chest: Effort normal and breath sounds normal. She has no wheezes. She has no rales.  Abdominal: Soft. Bowel sounds are normal. She exhibits no distension. There is no tenderness. There is no rebound.  Neurological: She is alert and oriented to person, place, and time.  Skin: Skin is warm and dry.  Vitals reviewed.  HIDA scan results reviewed.  Dr. Patty Sermonsehman's office notes reviewed. Assessment  Chronic cholecystitis Plan   Patient will call to schedule laparoscopic cholecystectomy.  The risks and benefits of the procedure including bleeding, infection, hepatobiliary injury, the possibility of recurrence of the pain, and the possibility of an open procedure were fully explained to the patient, who  gives informed consent.

## 2017-02-16 ENCOUNTER — Encounter: Payer: Self-pay | Admitting: Family Medicine

## 2017-04-26 ENCOUNTER — Encounter: Payer: Medicare Other | Admitting: Family Medicine

## 2017-05-04 DIAGNOSIS — H401131 Primary open-angle glaucoma, bilateral, mild stage: Secondary | ICD-10-CM | POA: Diagnosis not present

## 2017-07-17 ENCOUNTER — Encounter: Payer: Self-pay | Admitting: Family Medicine

## 2017-07-17 ENCOUNTER — Ambulatory Visit (INDEPENDENT_AMBULATORY_CARE_PROVIDER_SITE_OTHER): Payer: Medicare Other | Admitting: Family Medicine

## 2017-07-17 ENCOUNTER — Other Ambulatory Visit: Payer: Self-pay

## 2017-07-17 VITALS — BP 114/78 | HR 78 | Resp 16 | Ht 65.0 in | Wt 207.1 lb

## 2017-07-17 DIAGNOSIS — Z0000 Encounter for general adult medical examination without abnormal findings: Secondary | ICD-10-CM

## 2017-07-17 DIAGNOSIS — Z13220 Encounter for screening for lipoid disorders: Secondary | ICD-10-CM

## 2017-07-17 DIAGNOSIS — E559 Vitamin D deficiency, unspecified: Secondary | ICD-10-CM

## 2017-07-17 DIAGNOSIS — R7303 Prediabetes: Secondary | ICD-10-CM

## 2017-07-17 LAB — POCT URINALYSIS DIPSTICK
BILIRUBIN UA: NEGATIVE
GLUCOSE UA: NEGATIVE
Leukocytes, UA: NEGATIVE
NITRITE UA: NEGATIVE
PH UA: 7.5 (ref 5.0–8.0)
Protein, UA: NEGATIVE
RBC UA: NEGATIVE
SPEC GRAV UA: 1.02 (ref 1.010–1.025)
Urobilinogen, UA: 0.2 E.U./dL

## 2017-07-17 NOTE — Progress Notes (Signed)
Preventive Screening-Counseling & Management   Patient present here today for a welcome to medicare   Current Problems (verified)   Medications Prior to Visit Allergies (verified)   PAST HISTORY  Family History (updated)   Social History Married with 3 children, Still employed part time as a Lawyer[employer]NA, never been a smoker.    Risk Factors  Current exercise habits:  Goes to gym 4 days a week   Dietary issues discussed: Tries to be cautious of her carb intake and eats variety of fruits and vegetables, limits fried fatty foods    Cardiac risk factors: None   Depression Screen  (Note: if answer to either of the following is "Yes", a more complete depression screening is indicated)   Over the past two weeks, have you felt down, depressed or hopeless? No  Over the past two weeks, have you felt little interest or pleasure in doing things? No  Have you lost interest or pleasure in daily life? No  Do you often feel hopeless? No  Do you cry easily over simple problems? No   Activities of Daily Living  In your present state of health, do you have any difficulty performing the following activities?  Driving?: No Managing money?: No Feeding yourself?:No Getting from bed to chair?:No Climbing a flight of stairs?:No Preparing food and eating?:No Bathing or showering?:No Getting dressed?:No Getting to the toilet?:No Using the toilet?:No Moving around from place to place?: No  Fall Risk Assessment In the past year have you fallen or had a near fall?:No Are you currently taking any medications that make you dizzy?:No   Hearing Difficulties: No Do you often ask people to speak up or repeat themselves?:No Do you experience ringing or noises in your ears?:No Do you have difficulty understanding soft or whispered voices?:No  Cognitive Testing  Alert? Yes Normal Appearance?Yes  Oriented to person? Yes Place? Yes  Time? Yes  Displays appropriate judgment?Yes  Can read the correct time  from a watch face? yes Are you having problems remembering things?No   Advanced Directives have been discussed with the patient?Yes, will give information , does not currently have a living will however discussion started and she is willing to work on this  List the Names of Other Physician/Practitioners you currently use: Op[facility] ([facility])    Indicate any recent Medical Services you may have received from other than [facility] providers in the past year (date may be approximate).     Medicare Attestation  I have personally reviewed:  The patient's medical and social history  Their use of alcohol, tobacco or illicit drugs  Their current medications and supplements  The patient's functional ability including ADLs,fall risks, home safety risks, cognitive, and hearing and visual impairment  Diet and physical activities  Evidence for depression or mood disorders  The patient's weight, height, BMI, and visual acuity have been recorded in the chart. I have made referrals, counseling, and provided education to the patient based on review of the above and I have provided the patient with a written personalized care plan for preventive services.    Physical Exam BP 114/78   Pulse 78   Resp 16   Ht 5\' 5"  (1.651 m)   Wt 207 lb 1.9 oz (93.9 kg)   SpO2 98%   BMI 34.47 kg/m    EKG: sinus bradycardia, no ischemia or LVH rate 54  CCUA: normal  Assessment & Plan:  Welcome to Medicare preventive visit Visit as documented Needs to work on [employer]weight  loss. Refused flu vaccine despite education Needs to prepare living will, started the process of education and material given for her to review Low CV risk and low risk for cancer. EKG and CCUA are both normal

## 2017-07-17 NOTE — Patient Instructions (Addendum)
F/u with rectal in 5 months, call if you need me sooner  Flu vaccine today is refused  CBC, fasting lipid, chem 7 , HBa1C, Vit D and TSH 1 week before next visit   EKG shows no heart damage   It is important that you exercise regularly at least 30 minutes 5 times a week. If you develop chest pain, have severe difficulty breathing, or feel very tired, stop exercising immediately and seek medical attention   Please work on good  health habits so that your health will improve. 1. Commitment to daily physical activity for 30 to 60  minutes, if you are able to do this.  2. Commitment to wise food choices. Aim for half of your  food intake to be vegetable and fruit, one quarter starchy foods, and one quarter protein. Try to eat on a regular schedule  3 meals per day, snacking between meals should be limited to vegetables or fruits or small portions of nuts. 64 ounces of water per day is generally recommended, unless you have specific health conditions, like heart failure or kidney failure where you will need to limit fluid intake.  3. Commitment to sufficient and a  good quality of physical and mental rest daily, generally between 6 to 8 hours per day.  WITH PERSISTANCE AND PERSEVERANCE, THE IMPOSSIBLE , BECOMES THE NORM!   Thank you  for choosing  Primary Care. We consider it a privelige to serve you.  Delivering excellent health care in a caring and  compassionate way is our goal.  Partnering with you,  so that together we can achieve this goal is our strategy.

## 2017-07-18 ENCOUNTER — Encounter: Payer: Self-pay | Admitting: Family Medicine

## 2017-07-18 NOTE — Assessment & Plan Note (Addendum)
Visit as documented Needs to work on weight loss. Refused flu vaccine despite education Needs to prepare living will, started the process of education and material given for her to review Low CV risk and low risk for cancer. EKG and CCUA are both normal

## 2017-09-05 ENCOUNTER — Telehealth: Payer: Self-pay

## 2017-09-05 DIAGNOSIS — Z0100 Encounter for examination of eyes and vision without abnormal findings: Secondary | ICD-10-CM

## 2017-09-05 NOTE — Telephone Encounter (Signed)
Referral entered  

## 2017-09-05 NOTE — Telephone Encounter (Signed)
You will be getting a fax from [place] Onnie GrahamFrederich[place]  [name] needs a referral to the eye doctor now since her insurance has changed.

## 2017-09-05 NOTE — Addendum Note (Signed)
Addended by: Abner GreenspanHUDY, Detrell Umscheid H on: 09/05/2017 04:53 PM   Modules accepted: Orders

## 2017-09-25 ENCOUNTER — Telehealth: Payer: Self-pay | Admitting: Family Medicine

## 2017-09-25 NOTE — Telephone Encounter (Signed)
Due to patients insurance, a referral has to be called/or faxed into [employer]. I have been in circles with [employer] for over an hour on this 1 referral. I can not wait on hold any longer. I have applied to the online portal to be able to continue this from there. Will move forward with this tomorrow.

## 2017-10-12 ENCOUNTER — Other Ambulatory Visit: Payer: Self-pay

## 2017-10-12 ENCOUNTER — Ambulatory Visit (INDEPENDENT_AMBULATORY_CARE_PROVIDER_SITE_OTHER): Payer: Medicare HMO | Admitting: Family Medicine

## 2017-10-12 ENCOUNTER — Encounter: Payer: Self-pay | Admitting: Family Medicine

## 2017-10-12 VITALS — BP 160/80 | HR 60 | Temp 97.7°F | Resp 16 | Ht 65.0 in | Wt 204.8 lb

## 2017-10-12 DIAGNOSIS — J40 Bronchitis, not specified as acute or chronic: Secondary | ICD-10-CM

## 2017-10-12 DIAGNOSIS — R030 Elevated blood-pressure reading, without diagnosis of hypertension: Secondary | ICD-10-CM | POA: Diagnosis not present

## 2017-10-12 MED ORDER — AZITHROMYCIN 250 MG PO TABS: ORAL_TABLET | ORAL | 0 refills | Status: DC

## 2017-10-12 NOTE — Progress Notes (Signed)
Patient ID: [name], female    DOB: 1951-05-30, 67 y.o.   MRN: [id number]  Chief Complaint  Patient presents with  . Cough    Allergies Patient has no known allergies.  Subjective:   Tanya Rogers is a 67 y.o. female who presents to [facility] today.  HPI Here for an acute visit b/c is sick with a cough that has been going on for about 1-1/2 weeks. Husband was diagnosed with pnemonia several weeks ago and patient reports that she has been in and out of the hospital visiting a family member that is in the ICU.  Cough is productive of green mucus.  She reports that she has not been using any over-the-counter medicines or anything for her symptoms because she does not like to take pills.  She denies any wheezing or shortness of breath.  No dyspnea on exertion.  Denies any fevers or chills.  Reports that she does feel tired but has been at the hospital on night.  Has a history of pneumonia in the past.  Does not take any medications on a daily basis.  Tells me that she has never had high blood pressure.  Believes her blood pressure is elevated today because she has been up most the night at the hospital.   Cough  This is a new problem. The current episode started 1 to 4 weeks ago (10 days). The problem has been gradually worsening. The problem occurs every few minutes. The cough is productive of sputum. Associated symptoms include nasal congestion, postnasal drip and rhinorrhea. Pertinent negatives include no chest pain, chills, ear congestion, ear pain, fever, headaches, heartburn, hemoptysis, myalgias, rash, sore throat, shortness of breath, weight loss or wheezing. Nothing aggravates the symptoms. She has tried nothing for the symptoms. Her past medical history is significant for bronchitis and pneumonia. There is no history of asthma, COPD or emphysema.    Past Medical History:  Diagnosis Date  . Glaucoma 2003 approx  . Obesity     Past Surgical History:    Procedure Laterality Date  . EYE SURGERY Bilateral 12/2012 , 02/2013   [place]  . TUBAL LIGATION      Family History  Problem Relation Age of Onset  . Heart disease Mother   . Hypertension Mother   . Stroke Mother   . Diabetes Mother   . Diabetes Father   . Hypertension Father   . Diabetes Brother   . Kidney disease Brother   . Diabetes Brother   . Diabetes Brother   . Cancer Brother        lung      Social History   Socioeconomic History  . Marital status: Married    Spouse name: None  . Number of children: None  . Years of education: None  . Highest education level: None  Social Needs  . Financial resource strain: None  . Food insecurity - worry: None  . Food insecurity - inability: None  . Transportation needs - medical: None  . Transportation needs - non-medical: None  Occupational History  . None  Tobacco Use  . Smoking status: Never Smoker  . Smokeless tobacco: Never Used  Substance and Sexual Activity  . Alcohol use: None  . Drug use: No  . Sexual activity: Not Currently  Other Topics Concern  . None  Social History Narrative  . None   Current Outpatient Medications on File Prior to Visit  Medication Sig Dispense Refill  .  Calcium Carb-Cholecalciferol (CALCIUM 1000 + D PO) Take 2 tablets by mouth daily.      . fish oil-omega-3 fatty acids 1000 MG capsule Take 2 g by mouth daily.      . Multiple Vitamin (MULTIVITAMIN) capsule Take 1 capsule by mouth daily.      . timolol (BETIMOL) 0.5 % ophthalmic solution 1 drop 2 (two) times daily.    Marland Kitchen. doxycycline (VIBRAMYCIN) 100 MG capsule Take 100 mg by mouth 2 (two) times daily.     No current facility-administered medications on file prior to visit.      Review of Systems  Constitutional: Negative for appetite change, chills, fever and weight loss.  HENT: Positive for congestion, postnasal drip and rhinorrhea. Negative for ear pain and sore throat.   Respiratory: Positive for cough. Negative for  apnea, hemoptysis, chest tightness, shortness of breath and wheezing.   Cardiovascular: Negative for chest pain, palpitations and leg swelling.  Gastrointestinal: Negative for abdominal pain, diarrhea, heartburn and nausea.  Genitourinary: Negative for dysuria.  Musculoskeletal: Negative for arthralgias and myalgias.  Skin: Negative for rash.  Neurological: Negative for headaches.  Hematological: Negative for adenopathy. Does not bruise/bleed easily.  Psychiatric/Behavioral: Negative for dysphoric mood. The patient is not nervous/anxious.      Objective:   BP (!) 160/80 (BP Location: Left Arm, Patient Position: Sitting, Cuff Size: Normal)   Pulse 60   Temp 97.7 F (36.5 C) (Temporal)   Resp 16   Ht 5\' 5"  (1.651 m)   Wt 204 lb 12 oz (92.9 kg)   SpO2 97%   BMI 34.07 kg/m   Physical Exam  Constitutional: She is oriented to person, place, and time. She appears well-developed and well-nourished.  Wet productive cough, worse with deep breathing on examination.   HENT:  Head: Normocephalic and atraumatic.  Right Ear: External ear normal.  Left Ear: External ear normal.  Nose: Nose normal.  Mouth/Throat: Oropharynx is clear and moist. No oropharyngeal exudate.  Eyes: Conjunctivae and EOM are normal. Pupils are equal, round, and reactive to light. No scleral icterus.  Neck: Normal range of motion. Neck supple.  Cardiovascular: Normal rate, regular rhythm and normal heart sounds.  Pulmonary/Chest: Effort normal and breath sounds normal. No respiratory distress. She has no wheezes.  Neurological: She is alert and oriented to person, place, and time.  Skin: Skin is warm and dry. No rash noted.  Psychiatric: She has a normal mood and affect. Her behavior is normal.  Vitals reviewed.    Assessment and Plan  1. Bronchitis Diagnosis discussed with patient in detail.  Supportive care encouraged.  May use Delsym over-the-counter if needed for cough.  Counseled to avoid any decongestant  medication due to blood pressure reading.  Patient was told to call with any questions, concerns, or worrisome symptoms. - azithromycin (ZITHROMAX Z-PAK) 250 MG tablet; Take two pills for the first day and one pill each day for the next four days.  Dispense: 6 each; Refill: 0  2. Elevated blood-pressure reading without diagnosis of hypertension Discussed with patient today that her blood pressure is elevated even more than I would expect from just being up throughout the night.  I did recommend that she make an appointment with her PCP for blood pressure recheck within the next several weeks.  Return in about 4 weeks (around 11/09/2017) for bP check with PCP. Aliene Beamsachel Kieanna Rollo, MD 10/12/2017

## 2017-10-13 ENCOUNTER — Ambulatory Visit: Payer: Medicare Other | Admitting: Family Medicine

## 2017-11-06 ENCOUNTER — Other Ambulatory Visit: Payer: Self-pay | Admitting: Family Medicine

## 2017-11-06 DIAGNOSIS — Z1231 Encounter for screening mammogram for malignant neoplasm of breast: Secondary | ICD-10-CM

## 2017-11-09 ENCOUNTER — Ambulatory Visit: Payer: Medicare HMO

## 2017-11-09 ENCOUNTER — Ambulatory Visit (HOSPITAL_COMMUNITY): Payer: Medicare HMO

## 2017-11-16 ENCOUNTER — Ambulatory Visit (HOSPITAL_COMMUNITY): Payer: Medicare HMO

## 2017-11-30 ENCOUNTER — Ambulatory Visit (HOSPITAL_COMMUNITY): Payer: Medicare HMO

## 2017-12-11 ENCOUNTER — Ambulatory Visit (HOSPITAL_COMMUNITY)
Admission: RE | Admit: 2017-12-11 | Discharge: 2017-12-11 | Disposition: A | Discharge status: Home / Self Care | Payer: Medicare HMO | Source: Ambulatory Visit | Attending: Family Medicine | Admitting: Family Medicine

## 2017-12-11 ENCOUNTER — Encounter (HOSPITAL_COMMUNITY): Payer: Self-pay

## 2017-12-11 DIAGNOSIS — Z1231 Encounter for screening mammogram for malignant neoplasm of breast: Secondary | ICD-10-CM | POA: Diagnosis not present

## 2017-12-14 DIAGNOSIS — Z13220 Encounter for screening for lipoid disorders: Secondary | ICD-10-CM | POA: Diagnosis not present

## 2017-12-14 DIAGNOSIS — Z79899 Other long term (current) drug therapy: Secondary | ICD-10-CM | POA: Diagnosis not present

## 2017-12-14 DIAGNOSIS — R7303 Prediabetes: Secondary | ICD-10-CM | POA: Diagnosis not present

## 2017-12-14 DIAGNOSIS — E559 Vitamin D deficiency, unspecified: Secondary | ICD-10-CM | POA: Diagnosis not present

## 2017-12-15 LAB — CBC
HEMATOCRIT: 37.5 % (ref 35.0–45.0)
HEMOGLOBIN: 12.9 g/dL (ref 11.7–15.5)
MCH: 30.6 pg (ref 27.0–33.0)
MCHC: 34.4 g/dL (ref 32.0–36.0)
MCV: 88.9 fL (ref 80.0–100.0)
MPV: 11.9 fL (ref 7.5–12.5)
Platelets: 188 10*3/uL (ref 140–400)
RBC: 4.22 10*6/uL (ref 3.80–5.10)
RDW: 12.8 % (ref 11.0–15.0)
WBC: 4 10*3/uL (ref 3.8–10.8)

## 2017-12-15 LAB — BASIC METABOLIC PANEL
BUN: 8 mg/dL (ref 7–25)
CALCIUM: 9.3 mg/dL (ref 8.6–10.4)
CO2: 27 mmol/L (ref 20–32)
Chloride: 105 mmol/L (ref 98–110)
Creat: 0.96 mg/dL (ref 0.50–0.99)
GLUCOSE: 88 mg/dL (ref 65–99)
Potassium: 4.3 mmol/L (ref 3.5–5.3)
Sodium: 140 mmol/L (ref 135–146)

## 2017-12-15 LAB — HEMOGLOBIN A1C
HEMOGLOBIN A1C: 5.6 %{Hb} (ref <5.7)
Mean Plasma Glucose: 114 (calc)
eAG (mmol/L): 6.3 (calc)

## 2017-12-15 LAB — LIPID PANEL
CHOL/HDL RATIO: 2.3 (calc) (ref <5.0)
Cholesterol: 100 mg/dL (ref <200)
HDL: 44 mg/dL — ABNORMAL LOW (ref >50)
LDL CHOLESTEROL (CALC): 42 mg/dL
NON-HDL CHOLESTEROL (CALC): 56 mg/dL (ref <130)
Triglycerides: 62 mg/dL (ref <150)

## 2017-12-15 LAB — VITAMIN D 25 HYDROXY (VIT D DEFICIENCY, FRACTURES): Vit D, 25-Hydroxy: 34 ng/mL (ref 30–100)

## 2017-12-15 LAB — TSH: TSH: 0.75 m[IU]/L (ref 0.40–4.50)

## 2017-12-19 DIAGNOSIS — H401131 Primary open-angle glaucoma, bilateral, mild stage: Secondary | ICD-10-CM | POA: Diagnosis not present

## 2017-12-19 DIAGNOSIS — Z961 Presence of intraocular lens: Secondary | ICD-10-CM | POA: Diagnosis not present

## 2017-12-19 DIAGNOSIS — H524 Presbyopia: Secondary | ICD-10-CM | POA: Diagnosis not present

## 2017-12-20 ENCOUNTER — Ambulatory Visit (INDEPENDENT_AMBULATORY_CARE_PROVIDER_SITE_OTHER): Payer: Medicare HMO | Admitting: Family Medicine

## 2017-12-20 ENCOUNTER — Encounter: Payer: Self-pay | Admitting: Family Medicine

## 2017-12-20 VITALS — BP 110/80 | HR 60 | Resp 16 | Ht 65.0 in | Wt 194.0 lb

## 2017-12-20 DIAGNOSIS — M79671 Pain in right foot: Secondary | ICD-10-CM | POA: Diagnosis not present

## 2017-12-20 DIAGNOSIS — Z23 Encounter for immunization: Secondary | ICD-10-CM | POA: Diagnosis not present

## 2017-12-20 DIAGNOSIS — L84 Corns and callosities: Secondary | ICD-10-CM | POA: Diagnosis not present

## 2017-12-20 DIAGNOSIS — R7303 Prediabetes: Secondary | ICD-10-CM

## 2017-12-20 NOTE — Patient Instructions (Addendum)
Annual physical exam in 6 months, call if ypou need me before  Pneumonia 23 today   Keep up the GREAT work  It is important that you exercise regularly at least 30 minutes 5 times a week. If you develop chest pain, have severe difficulty breathing, or feel very tired, stop exercising immediately and seek medical attention  Thank you  for choosing [facility]. We consider it a privelige to serve you.  Delivering excellent health care in a caring and  compassionate way is our goal.  Partnering with you,  so that together we can achieve this goal is our strategy.

## 2017-12-22 ENCOUNTER — Encounter: Payer: Self-pay | Admitting: Podiatry

## 2017-12-22 ENCOUNTER — Ambulatory Visit (INDEPENDENT_AMBULATORY_CARE_PROVIDER_SITE_OTHER): Payer: Medicare HMO

## 2017-12-22 ENCOUNTER — Other Ambulatory Visit: Payer: Self-pay | Admitting: Podiatry

## 2017-12-22 ENCOUNTER — Ambulatory Visit: Payer: Medicare HMO | Admitting: Podiatry

## 2017-12-22 DIAGNOSIS — J209 Acute bronchitis, unspecified: Secondary | ICD-10-CM | POA: Insufficient documentation

## 2017-12-22 DIAGNOSIS — M79671 Pain in right foot: Secondary | ICD-10-CM

## 2017-12-22 DIAGNOSIS — M2041 Other hammer toe(s) (acquired), right foot: Secondary | ICD-10-CM

## 2017-12-22 DIAGNOSIS — L84 Corns and callosities: Secondary | ICD-10-CM

## 2017-12-22 DIAGNOSIS — M779 Enthesopathy, unspecified: Secondary | ICD-10-CM | POA: Diagnosis not present

## 2017-12-22 DIAGNOSIS — J019 Acute sinusitis, unspecified: Secondary | ICD-10-CM | POA: Insufficient documentation

## 2017-12-22 DIAGNOSIS — R001 Bradycardia, unspecified: Secondary | ICD-10-CM | POA: Insufficient documentation

## 2017-12-24 ENCOUNTER — Encounter: Payer: Self-pay | Admitting: Family Medicine

## 2017-12-24 NOTE — Assessment & Plan Note (Signed)
Patient educated about the importance of limiting  Carbohydrate intake , the need to commit to daily physical activity for a minimum of 30 minutes , and to commit weight loss. The fact that changes in all these areas will reduce or eliminate all together the development of diabetes is stressed.   Diabetic Labs Latest Ref Rng & Units 12/14/2017 09/06/2016 09/30/2013 05/17/2013 03/22/2013  HbA1c <5.7 % of total Hgb 5.6 5.7(H) 6.0(H) 6.0(H) -  Chol <200 mg/dL 161 096 - - 045  HDL >40 mg/dL 98(J) 52 - - 47  Calc LDL mg/dL (calc) 42 58 - - 67  Triglycerides <150 mg/dL 62 63 - - 57  Creatinine 0.50 - 0.99 mg/dL 1.91 4.78 - - 2.95   BP/Weight 12/20/2017 10/12/2017 07/17/2017 01/19/2017 12/20/2016 11/03/2016 09/06/2016  Systolic BP 110 160 114 151 120 124 120  Diastolic BP 80 80 78 63 76 84 78  Wt. (Lbs) 194 204.75 207.12 147 192.6 195 192  BMI 32.28 34.07 34.47 24.46 33.06 34.54 34.01   No flowsheet data found.  Resolved, pt applauded on this

## 2017-12-24 NOTE — Assessment & Plan Note (Signed)
After obtaining informed consent, the vaccine is  administered by LPN.  

## 2017-12-24 NOTE — Progress Notes (Signed)
Subjective:   Patient ID: Tanya Rogers, female   DOB: 67 y.o.   MRN: [id number]   HPI Patient presents with lesions of a chronic nature right foot on the plantar aspect of the metatarsal 3 and 5 and also slight breakdown of tissue between the fourth and fifth toes.  Patient does not smoke and likes to be active   Review of Systems  All other systems reviewed and are negative.       Objective:  Physical Exam  Constitutional: She appears well-developed and well-nourished.  Cardiovascular: Intact distal pulses.  Pulmonary/Chest: Effort normal.  Musculoskeletal: Normal range of motion.  Neurological: She is alert.  Skin: Skin is warm.  Nursing note and vitals reviewed.   Neurovascular status was found to be intact with muscle strength within normal limits.  Patient was noted to have diminished range of motion with mild crepitus of the ankle joint and does have keratotic lesion x2 right foot that are painful and redness between the fourth and fifth toes localized with no proximal edema erythema or drainage noted.  Patient has good digital perfusion well oriented x3     Assessment:  Chronic keratotic lesion x2 plantar aspect right foot with slight irritation to tissue between the fourth and fifth toe right foot     Plan:  H&P conditions reviewed and today deep debridement of lesions accomplished with no iatrogenic bleeding.  We utilized cream between the fourth and fifth toes right and if any breakdown of tissue were to occur redness or other pathology will be seen back for me to reevaluate

## 2017-12-24 NOTE — Assessment & Plan Note (Signed)
Improved Patient re-educated about  the importance of commitment to a  minimum of 150 minutes of exercise per week.  The importance of healthy food choices with portion control discussed. Encouraged to start a food diary, count calories and to consider  joining a support group. Sample diet sheets offered. Goals set by the patient for the next several months.   Weight /BMI 12/20/2017 10/12/2017 07/17/2017  WEIGHT 194 lb 204 lb 12 oz 207 lb 1.9 oz  HEIGHT     BMI 32.28 kg/m2 34.07 kg/m2 34.47 kg/m2

## 2017-12-24 NOTE — Progress Notes (Signed)
Tanya Rogers     MRN: [id number]      DOB: Nov 06, 1950   HPI Tanya Rogers is here for follow up and re-evaluation of ta.  Preventive health is updated, specifically  Cancer screening and Immunization.   She is here for lab review, she has worked hard at lifestyle change with dietary change and exercise and this has paid off handsomely with weight loss and improvement in blood sugar C/o right foot pain and callous wants help   ROS Denies recent fever or chills. Denies sinus pressure, nasal congestion, ear pain or sore throat. Denies chest congestion, productive cough or wheezing. Denies chest pains, palpitations and leg swelling Denies abdominal pain, nausea, vomiting,diarrhea or constipation.   Denies dysuria, frequency, hesitancy or incontinence. Denies joint pain, swelling and limitation in mobility. Denies headaches, seizures, numbness, or tingling. Denies depression, anxiety or insomnia. Denies skin break down or rash.   PE  BP 110/80   Pulse 60   Resp 16   Ht  (1.651 m)   Wt 194 lb (88 kg)   SpO2 98%   BMI 32.28 kg/m   Patient alert and oriented and in no cardiopulmonary distress.  HEENT: No facial asymmetry, EOMI,   oropharynx pink and moist.  Neck supple no JVD, no mass.  Chest: Clear to auscultation bilaterally.  CVS: S1, S2 no murmurs, no S3.Regular rate.  ABD: Soft non tender.   Ext: No edema  MS: Adequate ROM spine, shoulders, hips and knees.  Skin: Intact,2 calluses on sole of right foot, tender.  Psych: Good eye contact, normal affect. Memory intact not anxious or depressed appearing.  CNS: CN 2-12 intact, power,  normal throughout.no focal deficits noted.   Assessment & Plan  Obesity Improved Patient re-educated about  the importance of commitment to a  minimum of 150 minutes of exercise per week.  The importance of healthy food choices with portion control discussed. Encouraged to start a food diary, count calories and to  consider  joining a support group. Sample diet sheets offered. Goals set by the patient for the next several months.   Weight /BMI 12/20/2017 10/12/2017 07/17/2017  WEIGHT 194 lb 204 lb 12 oz 207 lb 1.9 oz  HEIGHT     BMI 32.28 kg/m2 34.07 kg/m2 34.47 kg/m2      Foot pain, right Right foot pain and callous x 2 , refer to podiatry  For evaluation and managment  Prediabetes Patient educated about the importance of limiting  Carbohydrate intake , the need to commit to daily physical activity for a minimum of 30 minutes , and to commit weight loss. The fact that changes in all these areas will reduce or eliminate all together the development of diabetes is stressed.   Diabetic Labs Latest Ref Rng & Units 12/14/2017 09/06/2016 09/30/2013 05/17/2013 03/22/2013  HbA1c <5.7 % of total Hgb 5.6 5.7(H) 6.0(H) 6.0(H) -  Chol <200 mg/dL 409 811 - - 914  HDL >78 mg/dL 29(F) 52 - - 47  Calc LDL mg/dL (calc) 42 58 - - 67  Triglycerides <150 mg/dL 62 63 - - 57  Creatinine 0.50 - 0.99 mg/dL 6.21 3.08 - - 6.57   BP/Weight 12/20/2017 10/12/2017 07/17/2017 01/19/2017 12/20/2016 11/03/2016 09/06/2016  Systolic BP 110 160 114 151 120 124 120  Diastolic BP 80 80 78 63 76 84 78  Wt. (Lbs) 194 204.75 207.12 147 192.6 195 192  BMI 32.28 34.07 34.47 24.46 33.06 34.54 34.01   No  flowsheet data found.  Resolved, pt applauded on this  Need for vaccination against Streptococcus pneumoniae After obtaining informed consent, the vaccine is  administered by LPN.

## 2017-12-24 NOTE — Assessment & Plan Note (Signed)
Right foot pain and callous x 2 , refer to podiatry  For evaluation and managment

## 2018-03-28 ENCOUNTER — Encounter (INDEPENDENT_AMBULATORY_CARE_PROVIDER_SITE_OTHER): Payer: Self-pay | Admitting: *Deleted

## 2018-04-02 ENCOUNTER — Encounter: Payer: Self-pay | Admitting: Family Medicine

## 2018-04-02 ENCOUNTER — Other Ambulatory Visit: Payer: Self-pay

## 2018-04-02 ENCOUNTER — Ambulatory Visit (INDEPENDENT_AMBULATORY_CARE_PROVIDER_SITE_OTHER): Payer: Medicare HMO | Admitting: Family Medicine

## 2018-04-02 VITALS — BP 114/76 | HR 58 | Resp 12 | Ht 65.0 in | Wt 191.1 lb

## 2018-04-02 DIAGNOSIS — E669 Obesity, unspecified: Secondary | ICD-10-CM | POA: Diagnosis not present

## 2018-04-02 DIAGNOSIS — Z6830 Body mass index (BMI) 30.0-30.9, adult: Secondary | ICD-10-CM | POA: Diagnosis not present

## 2018-04-02 DIAGNOSIS — L0390 Cellulitis, unspecified: Secondary | ICD-10-CM

## 2018-04-02 DIAGNOSIS — L282 Other prurigo: Secondary | ICD-10-CM

## 2018-04-02 MED ORDER — HYDROXYZINE HCL 25 MG PO TABS: ORAL_TABLET | ORAL | 0 refills | Status: DC

## 2018-04-02 MED ORDER — PREDNISONE 5 MG PO TABS: ORAL_TABLET | ORAL | 0 refills | Status: DC

## 2018-04-02 MED ORDER — FLUCONAZOLE 150 MG PO TABS: ORAL_TABLET | ORAL | 0 refills | Status: DC

## 2018-04-02 MED ORDER — CEPHALEXIN 500 MG PO CAPS: 500.0000 mg | ORAL_CAPSULE | Freq: Two times a day (BID) | ORAL | 0 refills | Status: DC

## 2018-04-02 NOTE — Patient Instructions (Addendum)
Keep apppointment for Physical and pleas e schedule wellness with nurse the samre day if posdsible  Four medications sent in for the rash and itch  [facility]

## 2018-04-22 ENCOUNTER — Encounter: Payer: Self-pay | Admitting: Family Medicine

## 2018-04-22 DIAGNOSIS — L03116 Cellulitis of left lower limb: Secondary | ICD-10-CM | POA: Insufficient documentation

## 2018-04-22 DIAGNOSIS — L0390 Cellulitis, unspecified: Secondary | ICD-10-CM | POA: Insufficient documentation

## 2018-04-22 NOTE — Progress Notes (Signed)
   Artist BeachDevern G Couser     MRN: [id number]      DOB: [birth year]   HPI [name] is here with a 2 week h/o an itchy blistering rash on trunk and extremities. Blisters and watery drainage with some crusting Denies any new skin products  ROS Denies recent fever or chills. Denies sinus pressure, nasal congestion, ear pain or sore throat. Denies chest congestion, productive cough or wheezing. Denies chest pains, palpitations and leg swelling Denies abdominal pain, nausea, vomiting,diarrhea or constipation.   Denies dysuria, frequency, hesitancy or incontinence. Denies joint pain, swelling and limitation in mobility. Denies headaches, seizures, numbness, or tingling. Denies depression, anxiety or insomnia.  PE  BP 114/76 (BP Location: Left Arm, Patient Position: Sitting, Cuff Size: Large)   Pulse (!) 58   Resp 12   Ht 5\' 5"  (1.651 m)   Wt 191 lb 1.3 oz (86.7 kg)   SpO2 96% Comment: room air  BMI 31.80 kg/m   Patient alert and oriented and in no cardiopulmonary distress.  HEENT: No facial asymmetry, EOMI,   oropharynx pink and moist.  Neck supple no JVD, no mass.  Chest: Clear to auscultation bilaterally.  CVS: S1, S2 no murmurs, no S3.Regular rate.  ABD: Soft non tender.   Ext: No edema  MS: Adequate ROM spine, shoulders, hips and knees.  Skin: vesicular hyperpigmented  Rash on trunk and and extremities  Psych: Good eye contact, normal affect. Memory intact not anxious or depressed appearing.  CNS: CN 2-12 intact, power,  normal throughout.no focal deficits noted.   Assessment & Plan  Cellulitis Keflex prescribed  Pruritic rash Prednisone and hydroxyzine and fluconazole prescribed  Obesity Improved. Patient re-educated about  the importance of commitment to a  minimum of 150 minutes of exercise per week.  The importance of healthy food choices with portion control discussed. Encouraged to start a food diary, count calories and to consider  joining a support  group. Sample diet sheets offered. Goals set by the patient for the next several months.   Weight /BMI 04/02/2018 12/20/2017 10/12/2017  WEIGHT 191 lb 1.3 oz 194 lb 204 lb 12 oz  HEIGHT 5\' 5"  5\' 5"  5\' 5"   BMI 31.8 kg/m2 32.28 kg/m2 34.07 kg/m2

## 2018-04-22 NOTE — Assessment & Plan Note (Signed)
Improved. Patient re-educated about  the importance of commitment to a  minimum of 150 minutes of exercise per week.  The importance of healthy food choices with portion control discussed. Encouraged to start a food diary, count calories and to consider  joining a support group. Sample diet sheets offered. Goals set by the patient for the next several months.   Weight /BMI 04/02/2018 12/20/2017 10/12/2017  WEIGHT 191 lb 1.3 oz 194 lb 204 lb 12 oz  HEIGHT 5\' 5"  5\' 5"  5\' 5"   BMI 31.8 kg/m2 32.28 kg/m2 34.07 kg/m2

## 2018-04-22 NOTE — Assessment & Plan Note (Signed)
Keflex prescribed.

## 2018-04-22 NOTE — Assessment & Plan Note (Addendum)
Prednisone and hydroxyzine and fluconazole prescribed

## 2018-05-03 ENCOUNTER — Other Ambulatory Visit: Payer: Self-pay | Admitting: Family Medicine

## 2018-05-21 DIAGNOSIS — H401132 Primary open-angle glaucoma, bilateral, moderate stage: Secondary | ICD-10-CM | POA: Diagnosis not present

## 2018-06-25 IMAGING — MG DIGITAL SCREENING BILATERAL MAMMOGRAM WITH CAD
4 series · 4 of 4 positions shown · non-contrast
Comparison: Previous exam(s).

CLINICAL DATA: Screening.

EXAM:
DIGITAL SCREENING BILATERAL MAMMOGRAM WITH CAD

[R CC]
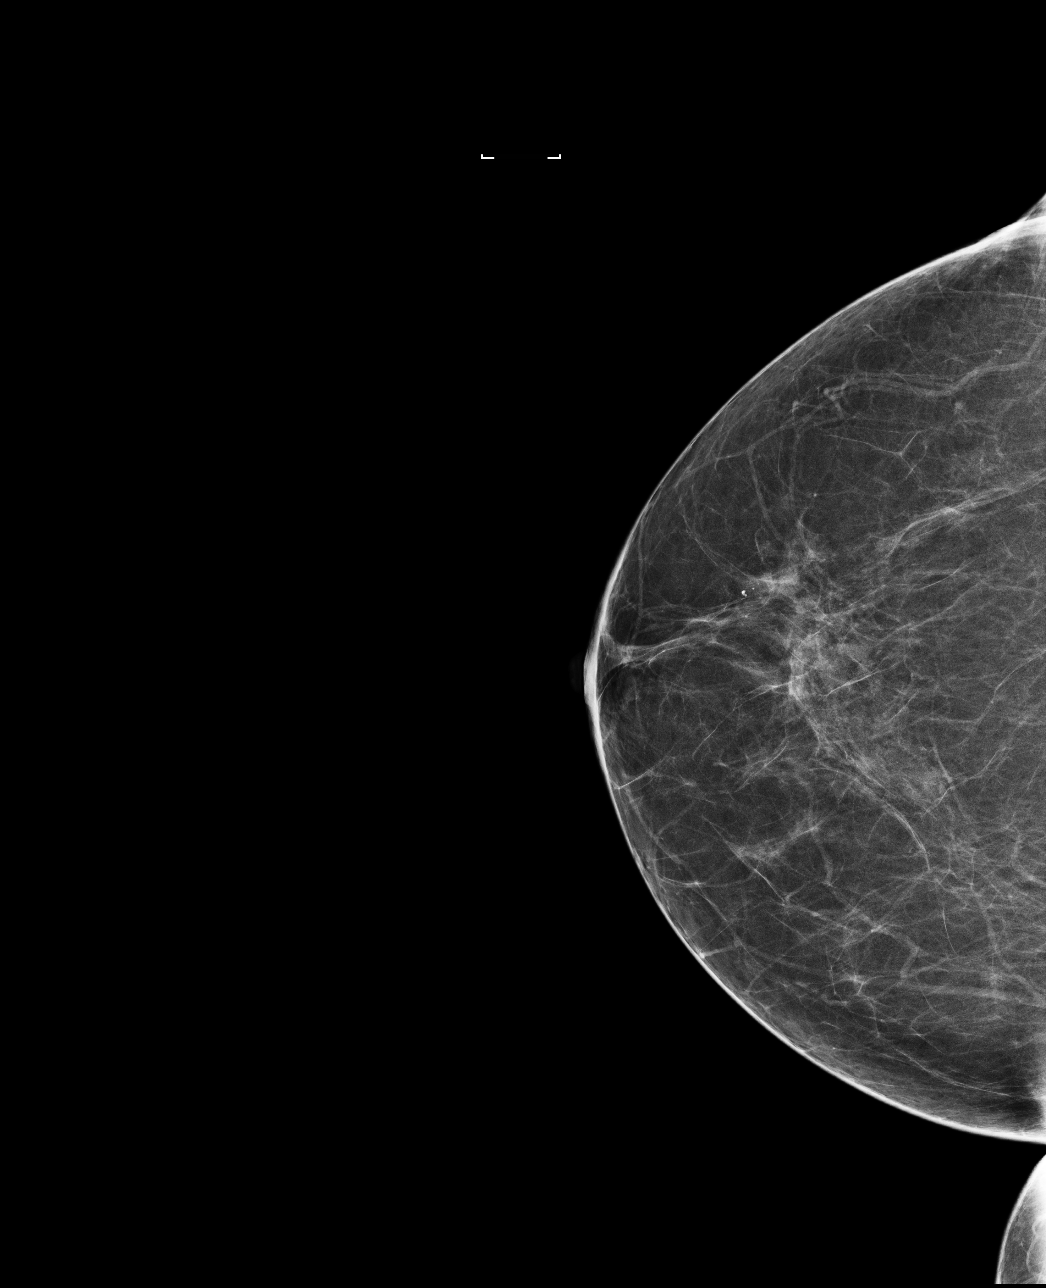

[L MLO]
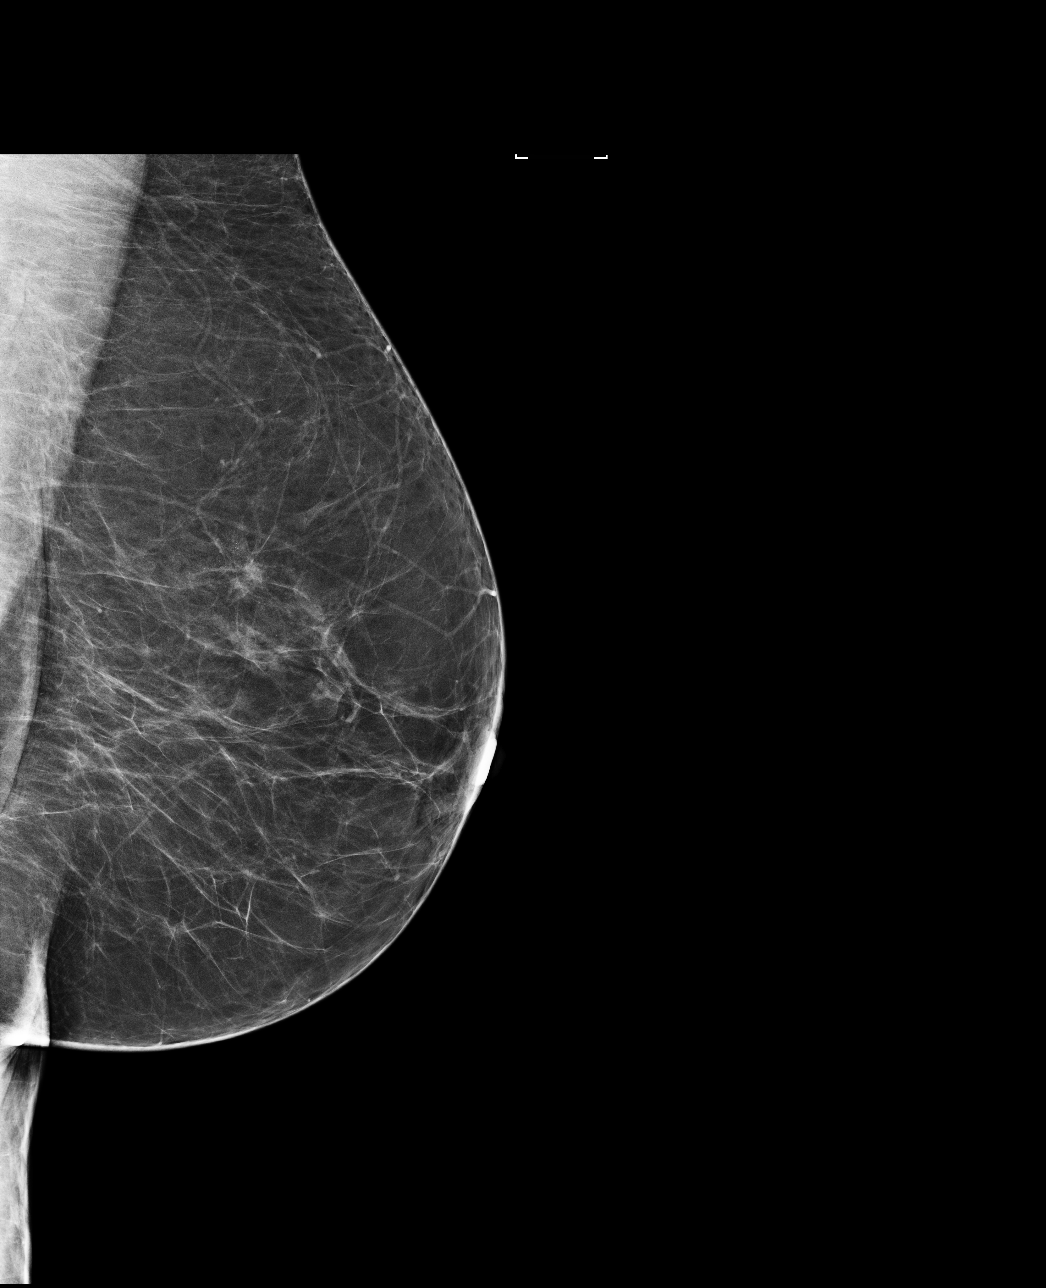

[R MLO]
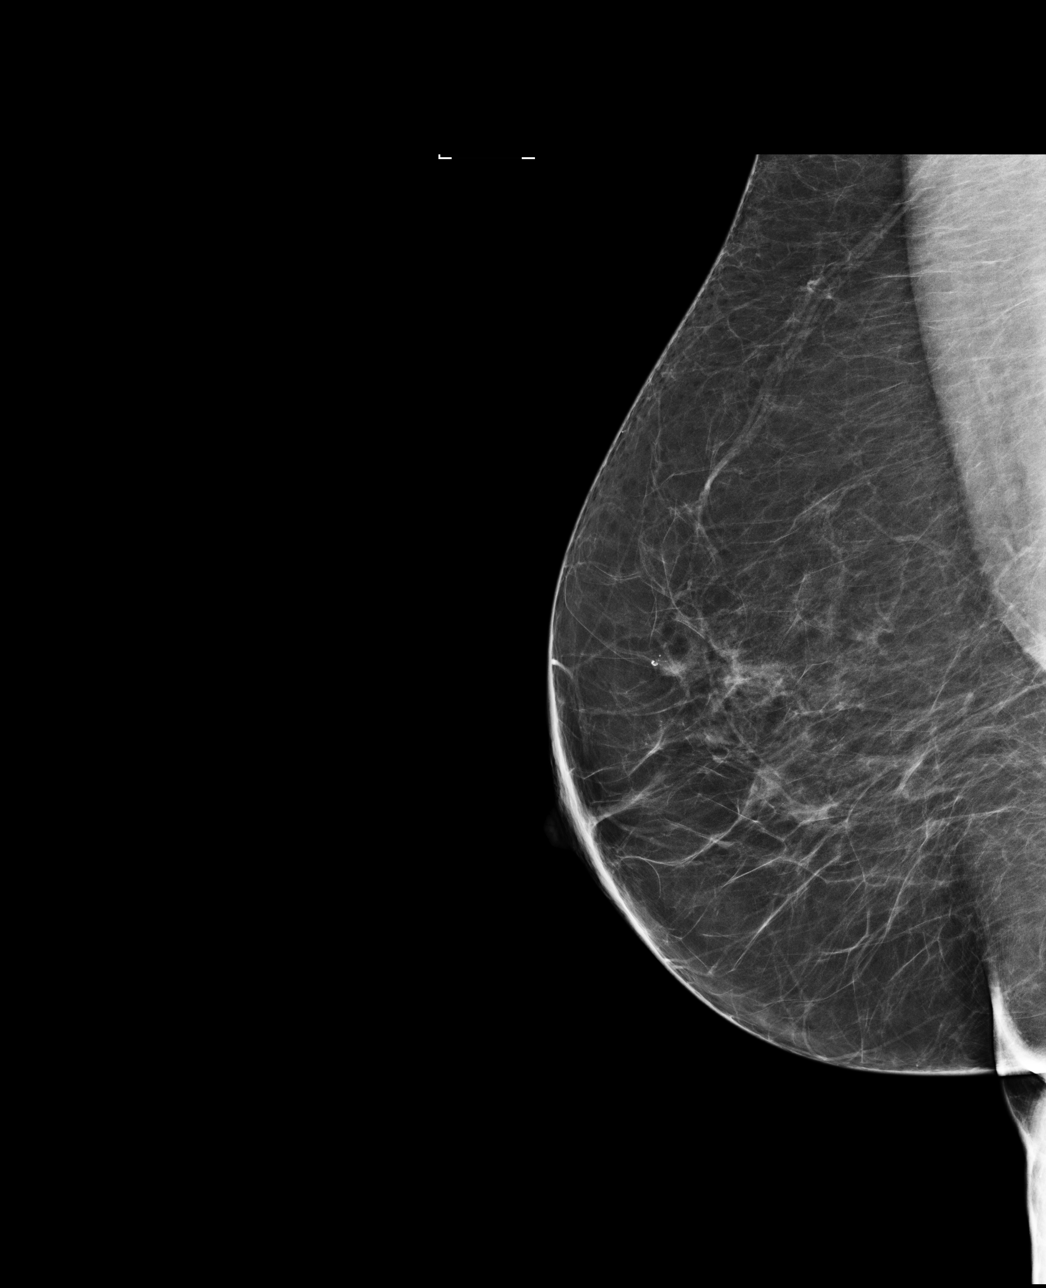

[L CC]
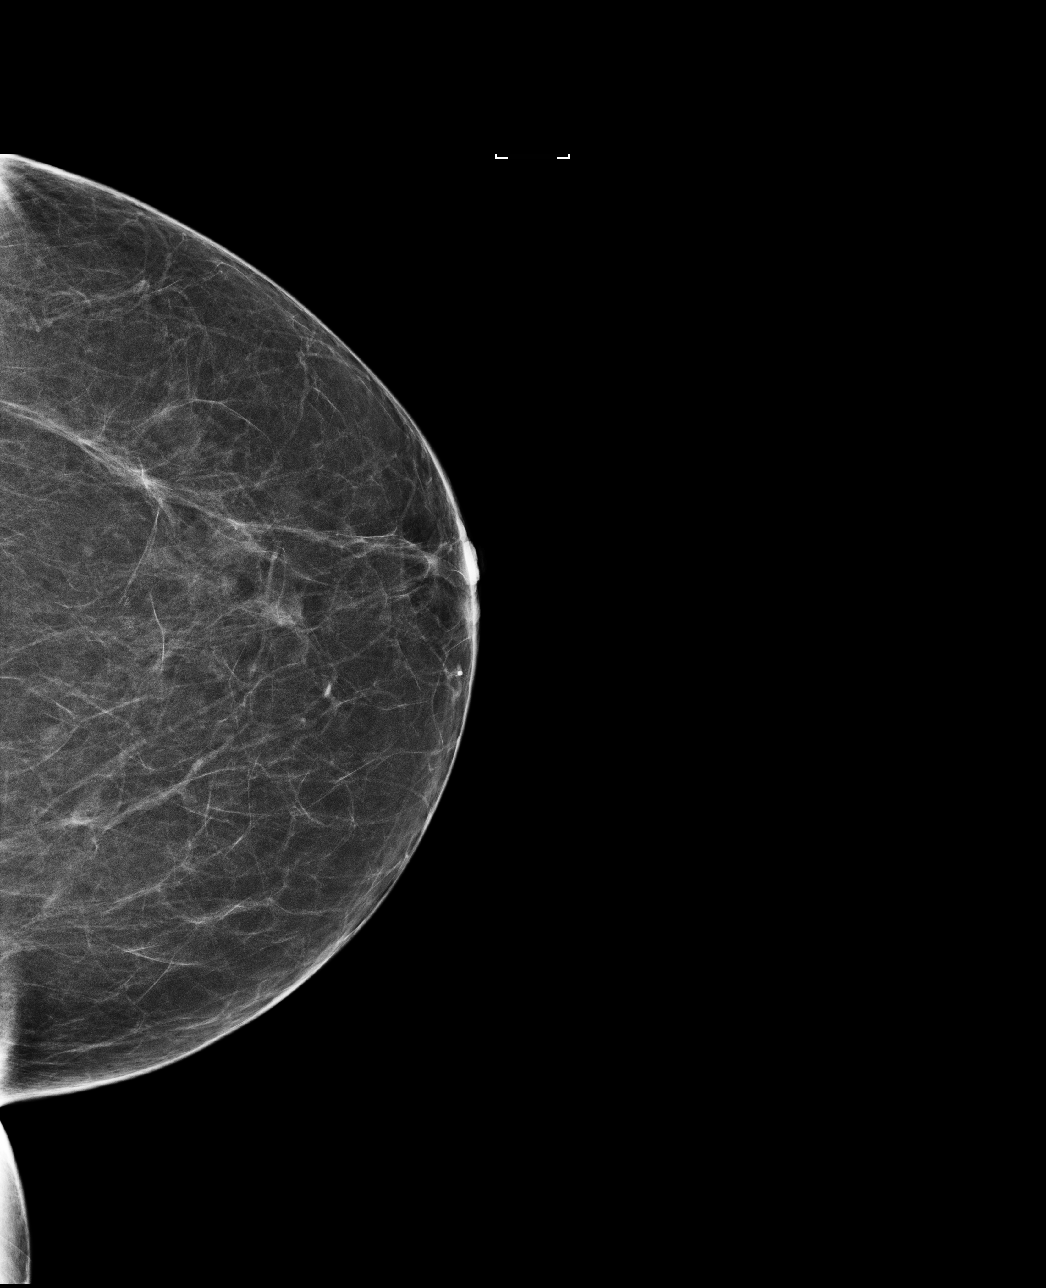

[4 of 4 positions shown; findings below may reference images not displayed]

ACR Breast Density Category b: There are scattered areas of
fibroglandular density.
FINDINGS: There are no findings suspicious for malignancy. Images were
processed with CAD.
IMPRESSION: No mammographic evidence of malignancy. A result letter of this
screening mammogram will be mailed directly to the patient.

RECOMMENDATION:
Screening mammogram in one year. (Code:AS-G-LCT)

BI-RADS CATEGORY  1: Negative.

## 2018-07-03 ENCOUNTER — Encounter: Payer: Medicare HMO | Admitting: Family Medicine

## 2018-07-05 ENCOUNTER — Ambulatory Visit: Payer: Medicare HMO

## 2018-07-05 ENCOUNTER — Encounter: Payer: Medicare HMO | Admitting: Family Medicine

## 2018-07-09 ENCOUNTER — Ambulatory Visit (INDEPENDENT_AMBULATORY_CARE_PROVIDER_SITE_OTHER): Payer: Managed Care, Other (non HMO)

## 2018-07-09 ENCOUNTER — Ambulatory Visit: Payer: Managed Care, Other (non HMO)

## 2018-07-09 VITALS — BP 132/68 | HR 62 | Resp 10 | Ht 65.0 in | Wt 196.0 lb

## 2018-07-09 DIAGNOSIS — Z0000 Encounter for general adult medical examination without abnormal findings: Secondary | ICD-10-CM

## 2018-07-09 NOTE — Progress Notes (Signed)
Subjective:   Tanya Rogers is a 67 y.o. female who presents for Medicare Annual (Subsequent) preventive examination.  Review of Systems:   Cardiac Risk Factors include: none     Objective:     Vitals: BP 132/68   Pulse 62   Resp 10   Ht 5\' 5"  (1.651 m)   Wt 196 lb (88.9 kg)   SpO2 98%   BMI 32.62 kg/m   Body mass index is 32.62 kg/m.  Advanced Directives 07/09/2018  Does Patient Have a Medical Advance Directive? No  Would patient like information on creating a medical advance directive? No - Patient declined    Tobacco Social History   Tobacco Use  Smoking Status Never Smoker  Smokeless Tobacco Never Used     Counseling given: Not Answered   Clinical Intake:  Pre-visit preparation completed: Yes  Pain : No/denies pain Pain Score: 0-No pain     BMI - recorded: 32.6 Nutritional Status: BMI > 30  Obese Nutritional Risks: None Diabetes: No  How often do you need to have someone help you when you read instructions, pamphlets, or other written materials from your doctor or pharmacy?: 1 - Never What is the last grade level you completed in school?: 12 grade   Interpreter Needed?: No  Information entered by :: Hartford Poli LPN   Past Medical History:  Diagnosis Date  . Glaucoma 2003 approx  . Obesity    Past Surgical History:  Procedure Laterality Date  . EYE SURGERY Bilateral 12/2012 , 02/2013   [place]  . TUBAL LIGATION     Family History  Problem Relation Age of Onset  . Heart disease Mother   . Hypertension Mother   . Stroke Mother   . Diabetes Mother   . Diabetes Father   . Hypertension Father   . Diabetes Brother   . Kidney disease Brother   . Diabetes Brother   . Diabetes Brother   . Cancer Brother        lung    Social History   Socioeconomic History  . Marital status: Married    Spouse name: Tanya Rogers   . Number of children: 3  . Years of education: 12+  . Highest education level: 12th grade  Occupational History  .  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 Financial resource strain: Not hard at all  . Food insecurity:    Worry: Never true    Inability: Never true  . Transportation needs:    Medical: No    Non-medical: No  Tobacco Use  . Smoking status: Never Smoker  . Smokeless tobacco: Never Used  Substance and Sexual Activity  . Alcohol use: Not on file  . Drug use: No  . Sexual activity: Not Currently  Lifestyle  . Physical activity:    Days per week: 0 days    Minutes per session: 0 min  . Stress: Not at all  Relationships  . Social connections:    Talks on phone: More than three times a week    Gets together: More than three times a week    Attends religious service: More than 4 times per year    Active member of club or organization: Yes    Attends meetings of clubs or organizations: More than 4 times per year    Relationship status: Married  Other Topics Concern  . Not on file  Social History Narrative  . Not on file    Outpatient Encounter  Medications as of 07/09/2018  Medication Sig  . Calcium Carb-Cholecalciferol (CALCIUM 1000 + D PO) Take 2 tablets by mouth daily.    . cephALEXin (KEFLEX) 500 MG capsule Take 1 capsule (500 mg total) by mouth 2 (two) times daily.  . fish oil-omega-3 fatty acids 1000 MG capsule Take 2 g by mouth daily.    . fluconazole (DIFLUCAN) 150 MG tablet Take one tablet once daily as needed for vaginal itch  . hydrOXYzine (ATARAX/VISTARIL) 25 MG tablet TAKE ONE TO TWO TABLETS AT BEDTIME FOR ITCHING  . Multiple Vitamin (MULTIVITAMIN) capsule Take 1 capsule by mouth daily.    . predniSONE (DELTASONE) 5 MG tablet Take one tablet 3 times daily for 2 days, then one tablet 2 times daily for for 2 days , then one tablet once daily for 2 days  . timolol (BETIMOL) 0.5 % ophthalmic solution 1 drop 2 (two) times daily.   No facility-administered encounter medications on file as of 07/09/2018.     Activities of Daily Living In your present state of  health, do you have any difficulty performing the following activities: 07/09/2018  Hearing? N  Vision? N  Difficulty concentrating or making decisions? N  Walking or climbing stairs? N  Dressing or bathing? N  Doing errands, shopping? N  Preparing Food and eating ? N  Using the Toilet? N  In the past six months, have you accidently leaked urine? N  Do you have problems with loss of bowel control? N  Managing your Medications? N  Managing your Finances? N  Housekeeping or managing your Housekeeping? N  Some recent data might be hidden    Patient Care Team: Kerri Perches, MD as PCP - General    Assessment:   This is a routine wellness examination for Tanya Rogers.  Exercise Activities and Dietary recommendations Current Exercise Habits: The patient does not participate in regular exercise at present  Goals    . DIET - EAT MORE FRUITS AND VEGETABLES    . Increase physical activity       Fall Risk Fall Risk  07/09/2018 04/02/2018 12/20/2017 07/17/2017 09/06/2016  Falls in the past year? 0 No No No No   Is the patient's home free of loose throw rugs in walkways, pet beds, electrical cords, etc?   no      Grab bars in the bathroom? yes      Handrails on the stairs?   yes      Adequate lighting?   yes  Timed Get Up and Go performed: Patient able to perform in 5 seconds without assistance   Depression Screen PHQ 2/9 Scores 07/09/2018 04/02/2018 12/20/2017 09/06/2016  PHQ - 2 Score 0 0 0 0     Cognitive Function     6CIT Screen 07/09/2018  What Year? 0 points  What month? 0 points  What time? 0 points  Count back from 20 0 points  Months in reverse 0 points  Repeat phrase 0 points  Total Score 0    Immunization History  Administered Date(s) Administered  . Pneumococcal Conjugate-13 09/06/2016  . Pneumococcal Polysaccharide-23 12/20/2017  . Tdap 08/17/2011  . Zoster 08/17/2011    Qualifies for Shingles Vaccine? Up to date   Screening Tests Health Maintenance    Topic Date Due  . INFLUENZA VACCINE  03/29/2018  . COLONOSCOPY  04/01/2019  . MAMMOGRAM  12/12/2019  . TETANUS/TDAP  08/16/2021  . DEXA SCAN  Completed  . Hepatitis C Screening  Completed  . PNA  vac Low Risk Adult  Completed    Cancer Screenings: Lung: Low Dose CT Chest recommended if Age 62-80 years, 30 pack-year currently smoking OR have quit w/in 15years. Patient does not qualify. Breast:  Up to date on Mammogram? Yes   Up to date of Bone Density/Dexa? Yes Colorectal: up to date   Additional Screenings:  Hepatitis C Screening: complete      Plan:   Continue to increase physical activity and eat more fruit and vegetables    I have personally reviewed and noted the following in the patient's chart:   . Medical and social history . Use of alcohol, tobacco or illicit drugs  . Current medications and supplements . Functional ability and status . Nutritional status . Physical activity . Advanced directives . List of other physicians . Hospitalizations, surgeries, and ER visits in previous 12 months . Vitals . Screenings to include cognitive, depression, and falls . Referrals and appointments  In addition, I have reviewed and discussed with patient certain preventive protocols, quality metrics, and best practice recommendations. A written personalized care plan for preventive services as well as general preventive health recommendations were provided to patient.     Fabian November, LPN  [id number]

## 2018-07-09 NOTE — Patient Instructions (Signed)
Ms. Ruis , Thank you for taking time to come for your Medicare Wellness Visit. I appreciate your ongoing commitment to your health goals. Please review the following plan we discussed and let me know if I can assist you in the future.   Screening recommendations/referrals: Colonoscopy: up to date  Mammogram: up to date  Bone Density: up to date  Recommended yearly ophthalmology/optometry visit for glaucoma screening and checkup Recommended yearly dental visit for hygiene and checkup  Vaccinations: Influenza vaccine: refused  Pneumococcal vaccine: up to date  Tdap vaccine: up to date  Shingles vaccine: up to date     Advanced directives: information provided   Conditions/risks identified: obesity   Next appointment: Wellness visit in one year    Preventive Care 65 Years and Older, Female Preventive care refers to lifestyle choices and visits with your health care provider that can promote health and wellness. What does preventive care include?  A yearly physical exam. This is also called an annual well check.  Dental exams once or twice a year.  Routine eye exams. Ask your health care provider how often you should have your eyes checked.  Personal lifestyle choices, including:  Daily care of your teeth and gums.  Regular physical activity.  Eating a healthy diet.  Avoiding tobacco and drug use.  Limiting alcohol use.  Practicing safe sex.  Taking low-dose aspirin every day.  Taking vitamin and mineral supplements as recommended by your health care provider. What happens during an annual well check? The services and screenings done by your health care provider during your annual well check will depend on your age, overall health, lifestyle risk factors, and family history of disease. Counseling  Your health care provider may ask you questions about your:  Alcohol use.  Tobacco use.  Drug use.  Emotional well-being.  Home and relationship  well-being.  Sexual activity.  Eating habits.  History of falls.  Memory and ability to understand (cognition).  Work and work Astronomer.  Reproductive health. Screening  You may have the following tests or measurements:  Height, weight, and BMI.  Blood pressure.  Lipid and cholesterol levels. These may be checked every 5 years, or more frequently if you are over 19 years old.  Skin check.  Lung cancer screening. You may have this screening every year starting at age 37 if you have a 30-pack-year history of smoking and currently smoke or have quit within the past 15 years.  Fecal occult blood test (FOBT) of the stool. You may have this test every year starting at age 3.  Flexible sigmoidoscopy or colonoscopy. You may have a sigmoidoscopy every 5 years or a colonoscopy every 10 years starting at age 55.  Hepatitis C blood test.  Hepatitis B blood test.  Sexually transmitted disease (STD) testing.  Diabetes screening. This is done by checking your blood sugar (glucose) after you have not eaten for a while (fasting). You may have this done every 1-3 years.  Bone density scan. This is done to screen for osteoporosis. You may have this done starting at age 75.  Mammogram. This may be done every 1-2 years. Talk to your health care provider about how often you should have regular mammograms. Talk with your health care provider about your test results, treatment options, and if necessary, the need for more tests. Vaccines  Your health care provider may recommend certain vaccines, such as:  Influenza vaccine. This is recommended every year.  Tetanus, diphtheria, and acellular pertussis (Tdap, Td)  vaccine. You may need a Td booster every 10 years.  Zoster vaccine. You may need this after age 34.  Pneumococcal 13-valent conjugate (PCV13) vaccine. One dose is recommended after age 17.  Pneumococcal polysaccharide (PPSV23) vaccine. One dose is recommended after age  36. Talk to your health care provider about which screenings and vaccines you need and how often you need them. This information is not intended to replace advice given to you by your health care provider. Make sure you discuss any questions you have with your health care provider. Document Released: 09/11/2015 Document Revised: 05/04/2016 Document Reviewed: 06/16/2015 Elsevier Interactive Patient Education  2017 [place] Prevention in the Home Falls can cause injuries. They can happen to people of all ages. There are many things you can do to make your home safe and to help prevent falls. What can I do on the outside of my home?  Regularly fix the edges of walkways and driveways and fix any cracks.  Remove anything that might make you trip as you walk through a door, such as a raised step or threshold.  Trim any bushes or trees on the path to your home.  Use bright outdoor lighting.  Clear any walking paths of anything that might make someone trip, such as rocks or tools.  Regularly check to see if handrails are loose or broken. Make sure that both sides of any steps have handrails.  Any raised decks and porches should have guardrails on the edges.  Have any leaves, snow, or ice cleared regularly.  Use sand or salt on walking paths during winter.  Clean up any spills in your garage right away. This includes oil or grease spills. What can I do in the bathroom?  Use night lights.  Install grab bars by the toilet and in the tub and shower. Do not use towel bars as grab bars.  Use non-skid mats or decals in the tub or shower.  If you need to sit down in the shower, use a plastic, non-slip stool.  Keep the floor dry. Clean up any water that spills on the floor as soon as it happens.  Remove soap buildup in the tub or shower regularly.  Attach bath mats securely with double-sided non-slip rug tape.  Do not have throw rugs and other things on the floor that can make  you trip. What can I do in the bedroom?  Use night lights.  Make sure that you have a light by your bed that is easy to reach.  Do not use any sheets or blankets that are too big for your bed. They should not hang down onto the floor.  Have a firm chair that has side arms. You can use this for support while you get dressed.  Do not have throw rugs and other things on the floor that can make you trip. What can I do in the kitchen?  Clean up any spills right away.  Avoid walking on wet floors.  Keep items that you use a lot in easy-to-reach places.  If you need to reach something above you, use a strong step stool that has a grab bar.  Keep electrical cords out of the way.  Do not use floor polish or wax that makes floors slippery. If you must use wax, use non-skid floor wax.  Do not have throw rugs and other things on the floor that can make you trip. What can I do with my stairs?  Do not leave  any items on the stairs.  Make sure that there are handrails on both sides of the stairs and use them. Fix handrails that are broken or loose. Make sure that handrails are as long as the stairways.  Check any carpeting to make sure that it is firmly attached to the stairs. Fix any carpet that is loose or worn.  Avoid having throw rugs at the top or bottom of the stairs. If you do have throw rugs, attach them to the floor with carpet tape.  Make sure that you have a light switch at the top of the stairs and the bottom of the stairs. If you do not have them, ask someone to add them for you. What else can I do to help prevent falls?  Wear shoes that:  Do not have high heels.  Have rubber bottoms.  Are comfortable and fit you well.  Are closed at the toe. Do not wear sandals.  If you use a stepladder:  Make sure that it is fully opened. Do not climb a closed stepladder.  Make sure that both sides of the stepladder are locked into place.  Ask someone to hold it for you, if  possible.  Clearly mark and make sure that you can see:  Any grab bars or handrails.  First and last steps.  Where the edge of each step is.  Use tools that help you move around (mobility aids) if they are needed. These include:  Canes.  Walkers.  Scooters.  Crutches.  Turn on the lights when you go into a dark area. Replace any light bulbs as soon as they burn out.  Set up your furniture so you have a clear path. Avoid moving your furniture around.  If any of your floors are uneven, fix them.  If there are any pets around you, be aware of where they are.  Review your medicines with your doctor. Some medicines can make you feel dizzy. This can increase your chance of falling. Ask your doctor what other things that you can do to help prevent falls. This information is not intended to replace advice given to you by your health care provider. Make sure you discuss any questions you have with your health care provider. Document Released: 06/11/2009 Document Revised: 01/21/2016 Document Reviewed: 09/19/2014 Elsevier Interactive Patient Education  2017 [employer].

## 2018-07-10 ENCOUNTER — Telehealth: Payer: Self-pay

## 2018-07-10 DIAGNOSIS — Z6830 Body mass index (BMI) 30.0-30.9, adult: Secondary | ICD-10-CM

## 2018-07-10 DIAGNOSIS — R7303 Prediabetes: Secondary | ICD-10-CM

## 2018-07-10 DIAGNOSIS — E669 Obesity, unspecified: Secondary | ICD-10-CM

## 2018-07-10 DIAGNOSIS — Z0000 Encounter for general adult medical examination without abnormal findings: Secondary | ICD-10-CM

## 2018-07-10 NOTE — Telephone Encounter (Signed)
Appointment made for Complete physical on Sep 13, 2018 at 3:20 pm. Lab work ordered, sent to patient with instructions

## 2018-09-13 ENCOUNTER — Encounter: Payer: Managed Care, Other (non HMO) | Admitting: Family Medicine

## 2018-10-10 DIAGNOSIS — H401132 Primary open-angle glaucoma, bilateral, moderate stage: Secondary | ICD-10-CM | POA: Diagnosis not present

## 2018-10-18 ENCOUNTER — Encounter: Payer: Managed Care, Other (non HMO) | Admitting: Family Medicine

## 2018-11-05 DIAGNOSIS — Z13220 Encounter for screening for lipoid disorders: Secondary | ICD-10-CM | POA: Diagnosis not present

## 2018-11-05 DIAGNOSIS — Z0000 Encounter for general adult medical examination without abnormal findings: Secondary | ICD-10-CM | POA: Diagnosis not present

## 2018-11-05 DIAGNOSIS — Z79899 Other long term (current) drug therapy: Secondary | ICD-10-CM | POA: Diagnosis not present

## 2018-11-05 LAB — CBC
HEMATOCRIT: 36.2 % (ref 35.0–45.0)
Hemoglobin: 12.3 g/dL (ref 11.7–15.5)
MCH: 30.6 pg (ref 27.0–33.0)
MCHC: 34 g/dL (ref 32.0–36.0)
MCV: 90 fL (ref 80.0–100.0)
MPV: 11.4 fL (ref 7.5–12.5)
Platelets: 171 10*3/uL (ref 140–400)
RBC: 4.02 10*6/uL (ref 3.80–5.10)
RDW: 12.6 % (ref 11.0–15.0)
WBC: 4.7 10*3/uL (ref 3.8–10.8)

## 2018-11-05 LAB — LIPID PANEL
CHOL/HDL RATIO: 2.7 (calc) (ref <5.0)
Cholesterol: 117 mg/dL (ref <200)
HDL: 43 mg/dL — ABNORMAL LOW (ref >=50)
LDL Cholesterol (Calc): 61 mg/dL (calc)
Non-HDL Cholesterol (Calc): 74 mg/dL (calc) (ref <130)
Triglycerides: 54 mg/dL (ref <150)

## 2018-11-05 LAB — BASIC METABOLIC PANEL WITH GFR
BUN/Creatinine Ratio: 21 (calc) (ref 6–22)
BUN: 21 mg/dL (ref 7–25)
CALCIUM: 9.1 mg/dL (ref 8.6–10.4)
CO2: 27 mmol/L (ref 20–32)
Chloride: 106 mmol/L (ref 98–110)
Creat: 1 mg/dL — ABNORMAL HIGH (ref 0.50–0.99)
GFR, Est African American: 68 mL/min/{1.73_m2} (ref >=60)
GFR, Est Non African American: 58 mL/min/{1.73_m2} — ABNORMAL LOW (ref >=60)
GLUCOSE: 90 mg/dL (ref 65–99)
POTASSIUM: 4.2 mmol/L (ref 3.5–5.3)
Sodium: 140 mmol/L (ref 135–146)

## 2018-11-08 ENCOUNTER — Other Ambulatory Visit: Payer: Self-pay

## 2018-11-08 ENCOUNTER — Encounter: Payer: Self-pay | Admitting: Family Medicine

## 2018-11-08 ENCOUNTER — Ambulatory Visit (INDEPENDENT_AMBULATORY_CARE_PROVIDER_SITE_OTHER): Payer: Medicare HMO | Admitting: Family Medicine

## 2018-11-08 VITALS — BP 126/78 | HR 60 | Temp 97.8°F | Resp 14 | Ht 65.0 in | Wt 197.0 lb

## 2018-11-08 DIAGNOSIS — Z0000 Encounter for general adult medical examination without abnormal findings: Secondary | ICD-10-CM | POA: Insufficient documentation

## 2018-11-08 DIAGNOSIS — Z6830 Body mass index (BMI) 30.0-30.9, adult: Secondary | ICD-10-CM

## 2018-11-08 DIAGNOSIS — E6609 Other obesity due to excess calories: Secondary | ICD-10-CM

## 2018-11-08 DIAGNOSIS — Z1231 Encounter for screening mammogram for malignant neoplasm of breast: Secondary | ICD-10-CM

## 2018-11-08 DIAGNOSIS — Z2821 Immunization not carried out because of patient refusal: Secondary | ICD-10-CM

## 2018-11-08 DIAGNOSIS — Z1211 Encounter for screening for malignant neoplasm of colon: Secondary | ICD-10-CM

## 2018-11-08 NOTE — Patient Instructions (Signed)
Please keep appointment for wellness in November Pls sched April mammogeram at checkout   You are being referred to Dr Karilyn Cota after we establish need for colonoscopy ( nurse please request her last colonoscopy from the office , Dr Claudie Leach was in [place] at the time, if 10 years ago needs to be referred )  Annual physical exam with MD 03/13 2021 or after, cll if you need me sooner Reconsider flu vaccine , still not too late  Start calcium 1000 to 12000 mg daily with vit D 800 IU to 1000 IU dailty  It is important that you exercise regularly at least 30 minutes 5 times a week. If you develop chest pain, have severe difficulty breathing, or feel very tired, stop exercising immediately and seek medical attention  Think about what you will eat, plan ahead. Choose " clean, green, fresh or frozen" over canned, processed or packaged foods which are more sugary, salty and fatty. 70 to 75% of food eaten should be vegetables and fruit. Three meals at set times with snacks allowed between meals, but they must be fruit or vegetables. Aim to eat over a 12 hour period , example 7 am to 7 pm, and STOP after  your last meal of the day. Drink water,generally about 64 ounces per day, no other drink is as healthy. Fruit juice is best enjoyed in a healthy way, by EATING the fruit.

## 2018-11-08 NOTE — Assessment & Plan Note (Signed)

## 2018-11-09 ENCOUNTER — Encounter: Payer: Self-pay | Admitting: Family Medicine

## 2018-11-09 NOTE — Progress Notes (Signed)
    Tanya Rogers     MRN: [id number]      DOB: March 18, 1951  HPI: Patient is in for annual physical exam. No other health concerns are expressed or addressed at the visit. Recent labs,  are reviewed. Immunization is reviewed , continues to refuse ths influenza vaccine   PE: BP 126/78   Pulse 60   Temp 97.8 F (36.6 C) (Oral)   Resp 14   Ht 5\' 5"  (1.651 m)   Wt 197 lb (89.4 kg)   SpO2 99%   BMI 32.78 kg/m   Pleasant  female, alert and oriented x 3, in no cardio-pulmonary distress. Afebrile. HEENT No facial trauma or asymetry. Sinuses non tender.  Extra occullar muscles intact, pupils equally reactive to light. External ears normal, tympanic membranes clear. Oropharynx moist, no exudate. Neck: supple, no adenopathy,JVD or thyromegaly.No bruits.  Chest: Clear to ascultation bilaterally.No crackles or wheezes. Non tender to palpation  Breast: No asymetry,no masses or lumps. No tenderness. No nipple discharge or inversion. No axillary or supraclavicular adenopathy  Cardiovascular system; Heart sounds normal,  S1 and  S2 ,no S3.  No murmur, or thrill. Apical beat not displaced Peripheral pulses normal.  Abdomen: Soft, non tender, no organomegaly or masses. No bruits. Bowel sounds normal. No guarding, tenderness or rebound.   Musculoskeletal exam: Full ROM of spine, hips , shoulders and knees. No deformity ,swelling or crepitus noted. No muscle wasting or atrophy.   Neurologic: Cranial nerves 2 to 12 intact. Power, tone ,sensation and reflexes normal throughout. No disturbance in gait. No tremor.  Skin: Intact, no ulceration, erythema , scaling or rash noted. Pigmentation normal throughout  Psych; Normal mood and affect. Judgement and concentration normal   Assessment & Plan:  Annual physical exam Annual exam as documented. Counseling done  re healthy lifestyle involving commitment to 150 minutes exercise per week, heart healthy diet, and attaining  healthy weight.The importance of adequate sleep also discussed. Regular seat belt use and home safety, is also discussed. Changes in health habits are decided on by the patient with goals and time frames  set for achieving them. Immunization and cancer screening needs are specifically addressed at this visit.

## 2018-11-09 NOTE — Assessment & Plan Note (Signed)
Re educated , continues to refuse

## 2018-11-09 NOTE — Assessment & Plan Note (Signed)
Deteriorated.  Patient re-educated about  the importance of commitment to a  minimum of 150 minutes of exercise per week as able.  The importance of healthy food choices with portion control discussed, as well as eating regularly and within a 12 hour window most days. The need to choose "clean , green" food 50 to 75% of the time is discussed, as well as to make water the primary drink and set a goal of 64 ounces water daily.  Encouraged to start a food diary,  and to consider  joining a support group. Sample diet sheets offered. Goals set by the patient for the next several months.   Weight /BMI 11/08/2018 07/09/2018 04/02/2018  WEIGHT 197 lb 196 lb 191 lb 1.3 oz  HEIGHT 5\' 5"  5\' 5"  5\' 5"   BMI 32.78 kg/m2 32.62 kg/m2 31.8 kg/m2

## 2018-11-12 ENCOUNTER — Encounter (INDEPENDENT_AMBULATORY_CARE_PROVIDER_SITE_OTHER): Payer: Self-pay | Admitting: *Deleted

## 2018-11-15 DIAGNOSIS — Z1211 Encounter for screening for malignant neoplasm of colon: Secondary | ICD-10-CM

## 2018-11-16 ENCOUNTER — Encounter (INDEPENDENT_AMBULATORY_CARE_PROVIDER_SITE_OTHER): Payer: Self-pay | Admitting: *Deleted

## 2018-12-11 ENCOUNTER — Other Ambulatory Visit: Payer: Self-pay

## 2018-12-11 ENCOUNTER — Ambulatory Visit (INDEPENDENT_AMBULATORY_CARE_PROVIDER_SITE_OTHER): Payer: Medicare HMO | Admitting: Family Medicine

## 2018-12-11 ENCOUNTER — Encounter: Payer: Self-pay | Admitting: Family Medicine

## 2018-12-11 DIAGNOSIS — H6121 Impacted cerumen, right ear: Secondary | ICD-10-CM | POA: Diagnosis not present

## 2018-12-11 MED ORDER — CARBAMIDE PEROXIDE 6.5 % OT SOLN: 5.0000 [drp] | Freq: Two times a day (BID) | OTIC | 0 refills | Status: DC | PRN

## 2018-12-11 NOTE — Progress Notes (Signed)
Virtual Visit via Telephone Note  I connected with Tanya Rogers on 12/11/18 at  8:40 AM EDT by telephone and verified that I am speaking with the correct person using two identifiers.   I discussed the limitations, risks, security and privacy concerns of performing an evaluation and management service by telephone. I also discussed with the patient that there may be a patient responsible charge related to this service. The patient expressed understanding and agreed to proceed.  Location of Patient: Home Location of Provider: Telehealth Consent was obtain for visit to be over via telehealth.  History of Present Illness: Tanya Rogers is a [age]-year-old female patient of Dr. Anthony Sar.  Who presents today for a telemedicine visit secondary to having transient heart rate sensations when laying down in her right ear. "  Hear my heart rate in my ear sometimes when I lay down" duration is been for the last 1 to 2 weeks.  She denies having any sinus issues, sore throat, any drainage from her ear or nose.  She denies having fevers, chills.  She denies that this is painful.  reports "it just feels different" she also reports that there is some muffled/reduced hearing on the right side.  Nothing aggravates it nothing relieves it.  It is intermittent.    Of note she reports that she got on [employer] and was very concerned about the possible outcomes of what this could be.  Therefore she thought she should call the doctor's office.  Past Medical, Surgical, Social History, Allergies, and Medications have been Reviewed.  Review of Systems  Constitutional: Negative for chills, fever and malaise/fatigue.  HENT: Positive for hearing loss. Negative for congestion, ear discharge, ear pain, sinus pain, sore throat and tinnitus.        Muffled feeling on Right side  Eyes: Negative for pain, discharge and redness.  Respiratory: Negative for cough, shortness of breath and wheezing.   Cardiovascular: Negative for  chest pain, palpitations and leg swelling.  Gastrointestinal: Negative.   Genitourinary: Negative.   Musculoskeletal: Negative.   Skin: Negative for itching and rash.  Neurological: Negative for dizziness and headaches.  Endo/Heme/Allergies: Negative.   Psychiatric/Behavioral: Negative.     Observations/Objective: Physical Exam  Constitutional: She is oriented to person, place, and time.  Neurological: She is alert and oriented to person, place, and time.  Psychiatric: Mood, memory, affect and judgment normal.  Pleasant in communication.   Assessment and Plan: 1. Impacted cerumen of right ear Signs and symptoms today on the telemedicine visit seem consistent with cerumen buildup.  Advised to use Debrox to help with softening of the wax.  Encouraged not to push anything into her ear to avoid pushing the wax further in.  Advised to follow-up with [place] in 1 to 2 weeks if symptoms worsen or fail to improve.  She has no pain or discomfort or other signs or symptoms at this time that warrant antibiotics and or screening or scanning for other problematic causes.  Patient acknowledged agreement and understanding of the plan.  Reviewed side effects, risks and benefits of medication.    - carbamide peroxide (DEBROX) 6.5 % OTIC solution; Place 5 drops into the right ear 2 (two) times daily as needed.  Dispense: 15 mL; Refill: 0   Follow Up Instructions: AVS printed and mailed   I discussed the assessment and treatment plan with the patient. The patient was provided an opportunity to ask questions and all were answered. The patient agreed with the plan and  demonstrated an understanding of the instructions.   The patient was advised to call back or seek an in-person evaluation if the symptoms worsen or if the condition fails to improve as anticipated.  I provided 10 minutes of non-face-to-face time during this encounter.   Freddy FinnerHannah M Mills, NP

## 2018-12-11 NOTE — Patient Instructions (Signed)
Thank you allowing [place] to complete your visit via telemedicine. I appreciate the opportunity to provide you with the care for your health and wellness. Today we discussed possible ear wax impaction  I have sent in the medication for ear ear.  This will come in drops.  I have attached information for you to review in the use of this particular type of medication and how it is used.  You are currently allowed to use this for up to 5 to 7 days.  Upon which if you do not feel any better please let us know.  Carbamide Peroxide ear solution What is this medicine? CARBAMIDE PEROXIDE (CAR bah mide per OX ide) is used to soften and help remove ear wax. This medicine may be used for other purposes; ask your health care provider or pharmacist if you have questions. COMMON BRAND NAME(S): Auro Ear, Auro Earache Relief, Debrox, Ear Drops, Ear Wax Removal, Ear Wax Remover, Earwax Treatment, Murine, Thera-Ear What should I tell my health care provider before I take this medicine? They need to know if you have any of these conditions: -dizziness -ear discharge -ear pain, irritation or rash -infection -perforated eardrum (hole in eardrum) -an unusual or allergic reaction to carbamide peroxide, glycerin, hydrogen peroxide, other medicines, foods, dyes, or preservatives -pregnant or trying to get pregnant -breast-feeding How should I use this medicine? This medicine is only for use in the outer ear canal. Follow the directions carefully. Wash hands before and after use. The solution may be warmed by holding the bottle in the hand for 1 to 2 minutes. Lie with the affected ear facing upward. Place the proper number of drops into the ear canal. After the drops are instilled, remain lying with the affected ear upward for 5 minutes to help the drops stay in the ear canal. A cotton ball may be gently inserted at the ear opening for no longer than 5 to 10 minutes to ensure retention. Repeat, if necessary, for the  opposite ear. Do not touch the tip of the dropper to the ear, fingertips, or other surface. Do not rinse the dropper after use. Keep container tightly closed. Talk to your pediatrician regarding the use of this medicine in children. While this drug may be used in children as young as 12 years for selected conditions, precautions do apply. Overdosage: If you think you have taken too much of this medicine contact a poison control center or emergency room at once. NOTE: This medicine is only for you. Do not share this medicine with others. What if I miss a dose? If you miss a dose, use it as soon as you can. If it is almost time for your next dose, use only that dose. Do not use double or extra doses. What may interact with this medicine? Interactions are not expected. Do not use any other ear products without asking your doctor or health care professional. This list may not describe all possible interactions. Give your health care provider a list of all the medicines, herbs, non-prescription drugs, or dietary supplements you use. Also tell them if you smoke, drink alcohol, or use illegal drugs. Some items may interact with your medicine. What should I watch for while using this medicine? This medicine is not for long-term use. Do not use for more than 4 days without checking with your health care professional. Contact your doctor or health care professional if your condition does not start to get better within a few days or if  you notice burning, redness, itching or swelling. What side effects may I notice from receiving this medicine? Side effects that you should report to your doctor or health care professional as soon as possible: -allergic reactions like skin rash, itching or hives, swelling of the face, lips, or tongue -burning, itching, and redness -worsening ear pain -rash Side effects that usually do not require medical attention (report to your doctor or health care professional if they  continue or are bothersome): -abnormal sensation while putting the drops in the ear -temporary reduction in hearing (but not complete loss of hearing) This list may not describe all possible side effects. Call your doctor for medical advice about side effects. You may report side effects to FDA at 1-800-FDA-1088. Where should I keep my medicine? Keep out of the reach of children. Store at room temperature between 15 and 30 degrees C (59 and 86 degrees F) in a tight, light-resistant container. Keep bottle away from excessive heat and direct sunlight. Throw away any unused medicine after the expiration date. NOTE: This sheet is a summary. It may not cover all possible information. If you have questions about this medicine, talk to your doctor, pharmacist, or health care provider.  2019 Elsevier/Gold Standard (2007-11-27 14:00:02)  WASH YOUR HANDS WELL AND FREQUENTLY. AVOID TOUCHING YOUR FACE, UNLESS YOUR HANDS ARE FRESHLY WASHED.  GET FRESH AIR DAILY. STAY HYDRATED WITH WATER.   It was a pleasure to see you and I look forward to continuing to work together on your health and well-being. Please do not hesitate to call the office if you need care or have questions about your care.  Have a wonderful day and week. With Gratitude, Tereasa CoopHannah Slyvester Latona, DNP, AGNP-BC

## 2018-12-20 ENCOUNTER — Ambulatory Visit (HOSPITAL_COMMUNITY): Payer: Medicare HMO

## 2019-01-10 ENCOUNTER — Ambulatory Visit (INDEPENDENT_AMBULATORY_CARE_PROVIDER_SITE_OTHER): Payer: Medicare HMO | Admitting: Family Medicine

## 2019-01-10 ENCOUNTER — Other Ambulatory Visit: Payer: Self-pay

## 2019-01-10 ENCOUNTER — Encounter: Payer: Self-pay | Admitting: Family Medicine

## 2019-01-10 DIAGNOSIS — L0390 Cellulitis, unspecified: Secondary | ICD-10-CM

## 2019-01-10 DIAGNOSIS — R1031 Right lower quadrant pain: Secondary | ICD-10-CM | POA: Diagnosis not present

## 2019-01-10 DIAGNOSIS — W57XXXA Bitten or stung by nonvenomous insect and other nonvenomous arthropods, initial encounter: Secondary | ICD-10-CM | POA: Diagnosis not present

## 2019-01-10 MED ORDER — DOXYCYCLINE HYCLATE 100 MG PO TABS: 100.0000 mg | ORAL_TABLET | Freq: Two times a day (BID) | ORAL | 0 refills | Status: DC

## 2019-01-10 MED ORDER — DOXYCYCLINE HYCLATE 100 MG PO TABS: 100.0000 mg | ORAL_TABLET | Freq: Two times a day (BID) | ORAL | 0 refills | Status: AC

## 2019-01-10 NOTE — Progress Notes (Signed)
Virtual Visit via Video Note   This visit type was conducted due to national recommendations for restrictions regarding the COVID-19 Pandemic (e.g. social distancing) in an effort to limit this patient's exposure and mitigate transmission in our community.  Due to her co-morbid illnesses, this patient is at least at moderate risk for complications without adequate follow up.  This format is felt to be most appropriate for this patient at this time.  All issues noted in this document were discussed and addressed.  A limited physical exam was performed with this format.    Evaluation Performed:  Follow-up visit  Date:  01/10/2019   ID:  [name], DOB March 22, 1951, MRN [id number]  Patient Location: Home Provider Location: Other:  telemedicine   Location of Patient: Home Location of Provider: Telehealth Consent was obtain for visit to be over via telehealth. I verified that I am speaking with the correct person using two identifiers.  PCP:  Kerri Perches, MD   Chief Complaint:  Pain and bites  History of Present Illness:    Lanetra DAMINI COVAULT is a 68 y.o. female patient of Dr. Anthony Sar.  Who presents for an acute visit today via telemedicine, WebEx.  Pain onset was a week ago with radiation from the right groin down into the anterior medial aspect of the right thigh.  She noticed a raised red area and thought it was a bite mark.  Then she looked around her skin and noticed that she had several other areas on the upper side of her right buttocks area and noticed that there was a burning sensation with these.  She denies any itching, drainage, insertion points of an actual bite that she can tell.  As she has a hard time seeing the ones on her backside.  More concerning she noticed that she has 1 or 2 swollen bumps in her groin which she is thinking might be lymph nodes.  She first noticed the groin swelling and then noticed the discomfort and pain that followed.  Collectively  she says that she has felt like this is been going on for 2 weeks.  She reports laying on her right side, because she is a right side sleeper makes it worse.  Additionally when clothes rub up against it is very uncomfortable as well.  Nothing is made it better.  Today she reports a 5 out of 10 for discomfort and pain/burning.   Denies seeing any insects, takes, spiders prior to the start of the red raised lesions.  Does report that she was working outside mowing her grass and working in the yard prior to this.  Does not remember being bit or feeling and sensation of being bit.  Just noted the multiple lesions when she felt the swelling and discomfort and burning.  She denies having any fever, chills, nausea, vomiting, itchiness, or any rash sensation or rashes.  She denies having any dizziness, headaches, chest pain, chest tightness, joint discomfort or joint pain.  She denies any neurological changes such as slurred speech, weakness, changes in sensation or strength.  Denies any changes in range of motion or difficulty with bending or moving.  Overall she has no complaints outside of previously stated above.            Past Medical, Surgical, Social History, Allergies, and Medications have been Reviewed.  The patient does not have symptoms concerning for COVID-19 infection (fever, chills, cough, or new shortness of breath).    Past  Medical History:  Diagnosis Date  . Glaucoma 2003 approx  . Obesity    Past Surgical History:  Procedure Laterality Date  . COLONOSCOPY N/A 04/15/2008  . EYE SURGERY Bilateral 12/2012 , 02/2013   [place]  . TUBAL LIGATION       No outpatient medications have been marked as taking for the 01/10/19 encounter (Office Visit) with Freddy Finner, NP.     Allergies:   Other   Social History   Tobacco Use  . Smoking status: Never Smoker  . Smokeless tobacco: Never Used  Substance Use Topics  . Alcohol use: Not on file  . Drug use: No     Family  Hx: The patient's family history includes Cancer in her brother; Diabetes in her brother, brother, brother, father, and mother; Heart disease in her mother; Hypertension in her father and mother; Kidney disease in her brother; Stroke in her mother.  ROS:   Please see the history of present illness.    Review of Systems  Constitutional: Negative for chills and fever.  HENT: Negative for congestion and sinus pain.   Eyes: Negative.   Respiratory: Negative for cough and shortness of breath.   Cardiovascular: Negative for chest pain and leg swelling.  Gastrointestinal: Negative for constipation and diarrhea.  Genitourinary: Negative.   Musculoskeletal: Negative.        Burning sensation from groin down the inner aspect of the thigh.  Skin: Negative for itching and rash.       Multiple raised, questionable bite marks: Right anterior thigh, upper buttocks right side as well  Questionable swelling, 2 small lumps/lymph nodes of right groin.  Neurological: Negative for dizziness and headaches.  Psychiatric/Behavioral: Negative.   All other systems reviewed and are negative.   Labs/Other Tests and Data Reviewed:    Recent Labs: 11/05/2018: BUN 21; Creat 1.00; Hemoglobin 12.3; Platelets 171; Potassium 4.2; Sodium 140   Recent Lipid Panel Lab Results  Component Value Date/Time   CHOL 117 11/05/2018 09:07 AM   TRIG 54 11/05/2018 09:07 AM   HDL 43 (L) 11/05/2018 09:07 AM   CHOLHDL 2.7 11/05/2018 09:07 AM   LDLCALC 61 11/05/2018 09:07 AM    Wt Readings from Last 3 Encounters:  11/08/18 197 lb (89.4 kg)  07/09/18 196 lb (88.9 kg)  04/02/18 191 lb 1.3 oz (86.7 kg)     Objective:    Vital Signs:  There were no vitals taken for this visit.   Physical Exam Constitutional:      Appearance: Normal appearance. She is obese.  HENT:     Head: Normocephalic and atraumatic.     Right Ear: External ear normal.     Left Ear: External ear normal.     Nose: Nose normal.  Eyes:     General:  No scleral icterus.       Right eye: No discharge.        Left eye: No discharge.     Conjunctiva/sclera: Conjunctivae normal.  Neck:     Musculoskeletal: Normal range of motion.  Pulmonary:     Effort: Pulmonary effort is normal. No tachypnea, accessory muscle usage or respiratory distress.     Comments: No shortness of breath noted during conversation, Musculoskeletal: Normal range of motion.  Skin:    Findings: Lesion present.       Neurological:     Mental Status: She is alert and oriented to person, place, and time.  Psychiatric:        Mood and  Affect: Mood normal.        Behavior: Behavior normal.        Thought Content: Thought content normal.        Judgment: Judgment normal.      ASSESSMENT & PLAN:   1. Cellulitis, unspecified cellulitis site Questionable start of cellulitis with possible lymph node swelling along with swelling of multiple areas with raised red lesions.  Possibly related to insect bite.  Secondary to this will prescribe doxycycline to cover cellulitis.  Suspicion for Lyme or tickborne illness is low given the multiple sites and no tick visualized are seen.  Doxy is still a good course of action to start secondary to this being a risk.  Secondary to being outside.  Course will be shorter than the recommended for Lyme or tickborne illness.  Should she start having increase in margins changes in her joints or changes in her symptoms we will be looking to refer her for lab evaluation secondary to findings.  We will bring her back in in 2 weeks to make sure that she is doing better she is advised to contact us if her symptoms change or worsen secondary to developing dizziness, headaches, nausea, vomiting, joint pain, discomfort or any flulike symptoms.  Reviewed side effects, risks and benefits of medication.   Patient acknowledged agreement and understanding of the plan.  - doxycycline (VIBRA-TABS) 100 MG tablet; Take 1 tablet (100 mg total) by mouth 2 (two) times  daily for 7 days. Completed the full course  Dispense: 14 tablet; Refill: 0  1. Multiple insect bites Questionable insect bites secondary to the multiple lesions and swelling and redness.  Will provide doxycycline as described above.  Reviewed side effects, risks and benefits of medication.  Patient acknowledged agreement and understanding of the plan.   - doxycycline (VIBRA-TABS) 100 MG tablet; Take 1 tablet (100 mg total) by mouth 2 (two) times daily for 7 days. Completed the full course  Dispense: 14 tablet; Refill: 0  2. Groin pain, right Questionable lymph node causing swelling of groin and discomfort.  Pending completion of antibiotics if swelling does not go down or she develops fever or chills we will evaluate based off of current symptoms at that time.  Time:   Today, I have spent 15 minutes with the patient with telehealth technology discussing the above problems.     Medication Adjustments/Labs and Tests Ordered: Current medicines are reviewed at length with the patient today.  Concerns regarding medicines are outlined above.   Tests Ordered: No orders of the defined types were placed in this encounter.   Medication Changes: No orders of the defined types were placed in this encounter.   Disposition:  Follow up in 2 week(s)  Signed, Freddy FinnerHannah M Bonnee Zertuche, NP  01/10/2019 8:28 AM     [facility]

## 2019-01-10 NOTE — Patient Instructions (Addendum)
    Thank you for completing your visit via WebEx. I appreciate the opportunity to provide you with the care for your health and wellness. Today we discussed: Multiple red raised lesions, questionable bug bites, questionable cellulitis, lymph nodes in the groin swelling right side  I have sent in a prescription for medication called doxycycline.  You will take 100 mg tablet twice daily by mouth for 7 days.  Please complete the full course as we discussed on the phone.  As we do not want to develop any abscesses if this is a cellulitis, skin infection.  Additionally I told you to review and look out for varying signs or changing in your symptoms.  These changes would include development of dizziness, headache, nausea, vomiting, fever, chills, joint discomfort, weakness in any extremities, or any flulike symptoms.  If you noticed that the lesion started growing in size or looking like what is called a bull's-eye or a target please notify us immediately.  As this could be a symptom of a tickborne illness and we would need to get lab work.  If after you complete the antibiotic you feel better about the lymph nodes or the swelling in your groin stays we might consider getting labs at that time to check your white blood cell count which is the cell that helps [place] let us know if you have any infection or something going on outside of what we treated you for possibly even getting an ultrasound to rule out anything else.  Hopefully the completion of the antibiotic will make you feel much better.  And take away the burning and swelling and discomfort that you are feeling.  You can take Tylenol as needed for pain.  We look forward to hearing back from you in 2 weeks and that you are doing well.  If you are doing well you are more than welcome to cancel the appointment if you do not need it.  Please call at least 24 to 48 hours in advance.  Please continue to practice social distancing during this time to keep you  and our community safe.    WASH YOUR HANDS WELL AND FREQUENTLY. AVOID TOUCHING YOUR FACE, UNLESS YOUR HANDS ARE FRESHLY WASHED.   GET FRESH AIR DAILY. STAY HYDRATED WITH WATER.   It was a pleasure to see you and I look forward to continuing to work together on your health and well-being. Please do not hesitate to call the office if you need care or have questions about your care.  Have a wonderful day and week. With Gratitude, Tereasa Coop, DNP, AGNP-BC

## 2019-01-23 ENCOUNTER — Telehealth: Payer: Self-pay | Admitting: *Deleted

## 2019-01-23 NOTE — Telephone Encounter (Signed)
Faxed received from [facility] to let us know pt has appt 02-21-19 at 1:50

## 2019-01-25 ENCOUNTER — Ambulatory Visit: Payer: Medicare HMO | Admitting: Family Medicine

## 2019-01-30 ENCOUNTER — Other Ambulatory Visit: Payer: Self-pay

## 2019-01-30 ENCOUNTER — Ambulatory Visit (HOSPITAL_COMMUNITY)
Admission: RE | Admit: 2019-01-30 | Discharge: 2019-01-30 | Disposition: A | Discharge status: Home / Self Care | Payer: Medicare HMO | Source: Ambulatory Visit | Attending: Family Medicine | Admitting: Family Medicine

## 2019-01-30 DIAGNOSIS — Z1231 Encounter for screening mammogram for malignant neoplasm of breast: Secondary | ICD-10-CM | POA: Diagnosis not present

## 2019-01-31 ENCOUNTER — Ambulatory Visit: Payer: Medicare HMO | Admitting: Family Medicine

## 2019-02-01 ENCOUNTER — Ambulatory Visit (HOSPITAL_COMMUNITY): Payer: Medicare HMO

## 2019-02-11 ENCOUNTER — Ambulatory Visit (INDEPENDENT_AMBULATORY_CARE_PROVIDER_SITE_OTHER): Payer: Medicare HMO | Admitting: Family Medicine

## 2019-02-11 ENCOUNTER — Other Ambulatory Visit: Payer: Self-pay

## 2019-02-11 ENCOUNTER — Encounter: Payer: Self-pay | Admitting: Family Medicine

## 2019-02-11 ENCOUNTER — Encounter (INDEPENDENT_AMBULATORY_CARE_PROVIDER_SITE_OTHER): Payer: Self-pay

## 2019-02-11 ENCOUNTER — Telehealth: Payer: Self-pay | Admitting: Family Medicine

## 2019-02-11 VITALS — BP 148/82 | HR 69 | Temp 97.8°F | Resp 15 | Ht 65.0 in | Wt 199.0 lb

## 2019-02-11 DIAGNOSIS — L989 Disorder of the skin and subcutaneous tissue, unspecified: Secondary | ICD-10-CM | POA: Diagnosis not present

## 2019-02-11 DIAGNOSIS — S80861A Insect bite (nonvenomous), right lower leg, initial encounter: Secondary | ICD-10-CM | POA: Diagnosis not present

## 2019-02-11 DIAGNOSIS — E6609 Other obesity due to excess calories: Secondary | ICD-10-CM | POA: Diagnosis not present

## 2019-02-11 DIAGNOSIS — Z6833 Body mass index (BMI) 33.0-33.9, adult: Secondary | ICD-10-CM

## 2019-02-11 DIAGNOSIS — I10 Essential (primary) hypertension: Secondary | ICD-10-CM | POA: Insufficient documentation

## 2019-02-11 DIAGNOSIS — S80862A Insect bite (nonvenomous), left lower leg, initial encounter: Secondary | ICD-10-CM | POA: Diagnosis not present

## 2019-02-11 DIAGNOSIS — H04203 Unspecified epiphora, bilateral lacrimal glands: Secondary | ICD-10-CM

## 2019-02-11 MED ORDER — OLOPATADINE HCL 0.1 % OP SOLN: 1.0000 [drp] | Freq: Two times a day (BID) | OPHTHALMIC | 1 refills | Status: DC

## 2019-02-11 MED ORDER — SPIRONOLACTONE 25 MG PO TABS: 25.0000 mg | ORAL_TABLET | Freq: Every day | ORAL | 4 refills | Status: DC

## 2019-02-11 NOTE — Telephone Encounter (Signed)
Called # on card to verify approval for Ms Sia to see Dr [place] Crane.  [employer] said [id number] [phone] does not require approval.  I called back Dr Juel Burrow office with information to bill [phone] since this is a medicare product.

## 2019-02-11 NOTE — Patient Instructions (Addendum)
F/U with MD in 8 to 10 weeks, re evaluate blood pressure, call if you need me sooner  New for blood pressure is spironolactone 1 daily  New for watery left eye is patanol eye drop, later changed to  oTC visine due to cost  Appointment today, now at dermatology re skin lesions  Please get non fasting chem 7 and EGFR  Thanks for choosing  Primary Care, we consider it a privelige to serve you.

## 2019-02-11 NOTE — Progress Notes (Signed)
   Tanya Rogers     MRN: [id number]      DOB: 09-25-1950   HPI Tanya Rogers is here with a  1 month h/o sporadic skin lesions, start as a red area, blister then scar , also c/o right inner thigh lesion x 1 month no redness, pain , drainage   Watery left eye with irritation of eyelids x 1 month, no vision change or red eye  Or pain  ROS Denies recent fever or chills. Denies sinus pressure, nasal congestion, ear pain or sore throat. Denies chest congestion, productive cough or wheezing. Denies chest pains, palpitations and leg swelling Denies abdominal pain, nausea, vomiting,diarrhea or constipation.   Denies dysuria, frequency, hesitancy or incontinence. Denies joint pain, swelling and limitation in mobility. Denies headaches, seizures, numbness, or tingling. Denies depression, anxiety or insomnia.    PE  BP (!) 148/82   Pulse 69   Temp 97.8 F (36.6 C) (Temporal)   Resp 15   Ht 5\' 5"  (1.651 m)   Wt 199 lb (90.3 kg)   SpO2 99%   BMI 33.12 kg/m   Patient alert and oriented and in no cardiopulmonary distress.  HEENT: No facial asymmetry, EOMI,   oropharynx pink and moist.  Neck supple no JVD, no mass.Excess tearing of left eye with mild conjunctival injection, and slight swelling of eyelids, upper  Greater than lower  Chest: Clear to auscultation bilaterally.  CVS: S1, S2 no murmurs, no S3.Regular rate.  ABD: Soft non tender.   Ext: No edema  MS: Adequate ROM spine, shoulders, hips and knees.  Skin: Intact, no ulcerations or rash noted.  Psych: Good eye contact, normal affect. Memory intact not anxious or depressed appearing.  CNS: CN 2-12 intact, power,  normal throughout.no focal deficits noted.   Assessment & Plan  Essential hypertension Start spironolactone daily DASH diet and commitment to daily physical activity for a minimum of 30 minutes discussed and encouraged, as a part of hypertension management. The importance of attaining a healthy weight  is also discussed.  BP/Weight 02/11/2019 11/08/2018 07/09/2018 04/02/2018 12/20/2017 10/12/2017 13/24/4010  Systolic BP 272 536 644 034 742 595 638  Diastolic BP 82 78 68 76 80 80 78  Wt. (Lbs) 199 197 196 191.08 194 204.75 207.12  BMI 33.12 32.78 32.62 31.8 32.28 34.07 34.47   F/iu in 8 weeks, check fasting kidney function    Skin lesions Several week h/o scattered bullous lesions, primarily on legs,  Refer to dermatology  Watery eyes Likely allergy based, patanol prescribed, due to cost recommended visine subsequently  Obesity  Patient re-educated about  the importance of commitment to a  minimum of 150 minutes of exercise per week as able.  The importance of healthy food choices with portion control discussed, as well as eating regularly and within a 12 hour window most days. The need to choose "clean , green" food 50 to 75% of the time is discussed, as well as to make water the primary drink and set a goal of 64 ounces water daily.    Weight /BMI 02/11/2019 11/08/2018 07/09/2018  WEIGHT 199 lb 197 lb 196 lb  HEIGHT 5\' 5"  5\' 5"  5\' 5"   BMI 33.12 kg/m2 32.78 kg/m2 32.62 kg/m2

## 2019-02-12 ENCOUNTER — Telehealth: Payer: Self-pay | Admitting: *Deleted

## 2019-02-12 DIAGNOSIS — I10 Essential (primary) hypertension: Secondary | ICD-10-CM | POA: Diagnosis not present

## 2019-02-12 LAB — BASIC METABOLIC PANEL WITH GFR
BUN/Creatinine Ratio: 20 (calc) (ref 6–22)
BUN: 21 mg/dL (ref 7–25)
CO2: 30 mmol/L (ref 20–32)
Calcium: 9.6 mg/dL (ref 8.6–10.4)
Chloride: 105 mmol/L (ref 98–110)
Creat: 1.04 mg/dL — ABNORMAL HIGH (ref 0.50–0.99)
GFR, Est African American: 64 mL/min/{1.73_m2} (ref >=60)
GFR, Est Non African American: 56 mL/min/{1.73_m2} — ABNORMAL LOW (ref >=60)
Glucose, Bld: 71 mg/dL (ref 65–139)
Potassium: 4.9 mmol/L (ref 3.5–5.3)
Sodium: 140 mmol/L (ref 135–146)

## 2019-02-12 NOTE — Telephone Encounter (Signed)
Pt got called in pantanol for eyes and and when she went to pick it up from the pharmacy it was 100.00 with insurance. She said she needed something different called in as she could not afford this. This can be sent to [facility]

## 2019-02-12 NOTE — Telephone Encounter (Signed)
pls advise availabale as OTC equivalent and that all prescribed meds are tier 3. May use visine

## 2019-02-12 NOTE — Telephone Encounter (Signed)
Pt aware.

## 2019-02-13 ENCOUNTER — Encounter: Payer: Self-pay | Admitting: Family Medicine

## 2019-02-13 ENCOUNTER — Telehealth: Payer: Self-pay | Admitting: *Deleted

## 2019-02-13 NOTE — Telephone Encounter (Signed)
Noted, thanks!

## 2019-02-13 NOTE — Telephone Encounter (Signed)
Give medication time time and contact dermatology re ongoing issue If she has itching , I recommend  Benadryl cream ( OTC) or 1 tablet at bedtime , also OTC

## 2019-02-13 NOTE — Telephone Encounter (Signed)
Spoke with patient and let her know to contact dermatology regarding the on going issue and if she felt like the medication wasn't working but she needed to give it time. Told her Dr.Simpson's recommendations. She stated she has used allergy med otc and a hydrocortisone cream which provided more relief than the "$42 prescription" she paid for. She started asking if Dr.Simpson would call her in some prednisone or something for it and I gently told her she needed to follow up with dermatology if she felt like she wasn't getting better or the medicine wasn't working because she had been referred out for this issue. She stated ok.

## 2019-02-13 NOTE — Telephone Encounter (Signed)
Pt called said she was saw in the office the other day she was sent to dermatologist and they told her that it was bug bites and gave her a medication. She wanted to know what Dr. Moshe Cipro thought as the places are still bothering her they are not better and she wanted to know what she could do as she is miserable.

## 2019-02-18 NOTE — Assessment & Plan Note (Signed)
Likely allergy based, patanol prescribed, due to cost recommended visine subsequently

## 2019-02-18 NOTE — Assessment & Plan Note (Signed)
  Patient re-educated about  the importance of commitment to a  minimum of 150 minutes of exercise per week as able.  The importance of healthy food choices with portion control discussed, as well as eating regularly and within a 12 hour window most days. The need to choose "clean , green" food 50 to 75% of the time is discussed, as well as to make water the primary drink and set a goal of 64 ounces water daily.    Weight /BMI 02/11/2019 11/08/2018 07/09/2018  WEIGHT 199 lb 197 lb 196 lb  HEIGHT 5\' 5"  5\' 5"  5\' 5"   BMI 33.12 kg/m2 32.78 kg/m2 32.62 kg/m2

## 2019-02-18 NOTE — Assessment & Plan Note (Signed)
Start spironolactone daily DASH diet and commitment to daily physical activity for a minimum of 30 minutes discussed and encouraged, as a part of hypertension management. The importance of attaining a healthy weight is also discussed.  BP/Weight 02/11/2019 11/08/2018 07/09/2018 04/02/2018 12/20/2017 10/12/2017 45/80/9983  Systolic BP 382 505 397 673 419 379 024  Diastolic BP 82 78 68 76 80 80 78  Wt. (Lbs) 199 197 196 191.08 194 204.75 207.12  BMI 33.12 32.78 32.62 31.8 32.28 34.07 34.47   F/iu in 8 weeks, check fasting kidney function

## 2019-02-18 NOTE — Assessment & Plan Note (Signed)
Several week h/o scattered bullous lesions, primarily on legs,  Refer to dermatology

## 2019-03-18 ENCOUNTER — Telehealth: Payer: Self-pay | Admitting: Family Medicine

## 2019-03-18 NOTE — Telephone Encounter (Signed)
Pt is calling in she needs a Auth\Pre cert to see the [facility] on Thursday.,  I called [employer]--there computers are down, advised me to call back in a couple of hours

## 2019-03-20 NOTE — Telephone Encounter (Signed)
Completed the referral, and faxed to [facility]

## 2019-04-03 ENCOUNTER — Telehealth: Payer: Self-pay | Admitting: Family Medicine

## 2019-04-03 DIAGNOSIS — I10 Essential (primary) hypertension: Secondary | ICD-10-CM

## 2019-04-03 NOTE — Telephone Encounter (Signed)
Chem 7 ordered.

## 2019-04-03 NOTE — Telephone Encounter (Signed)
Please send non fasting chem 7 and EGFR to [facility]

## 2019-04-04 ENCOUNTER — Encounter: Payer: Self-pay | Admitting: Family Medicine

## 2019-04-04 LAB — BASIC METABOLIC PANEL WITH GFR
BUN: 22 mg/dL (ref 7–25)
CO2: 30 mmol/L (ref 20–32)
Calcium: 9.3 mg/dL (ref 8.6–10.4)
Chloride: 105 mmol/L (ref 98–110)
Creat: 0.97 mg/dL (ref 0.50–0.99)
GFR, Est African American: 70 mL/min/{1.73_m2} (ref >=60)
GFR, Est Non African American: 60 mL/min/{1.73_m2} (ref >=60)
Glucose, Bld: 97 mg/dL (ref 65–99)
Potassium: 5.2 mmol/L (ref 3.5–5.3)
Sodium: 139 mmol/L (ref 135–146)

## 2019-04-08 ENCOUNTER — Encounter: Payer: Self-pay | Admitting: Family Medicine

## 2019-04-08 ENCOUNTER — Other Ambulatory Visit: Payer: Self-pay

## 2019-04-08 ENCOUNTER — Ambulatory Visit (INDEPENDENT_AMBULATORY_CARE_PROVIDER_SITE_OTHER): Payer: Medicare HMO | Admitting: Family Medicine

## 2019-04-08 VITALS — BP 128/82 | HR 57 | Temp 97.7°F | Resp 16 | Ht 65.0 in | Wt 202.0 lb

## 2019-04-08 DIAGNOSIS — R079 Chest pain, unspecified: Secondary | ICD-10-CM | POA: Insufficient documentation

## 2019-04-08 DIAGNOSIS — R7303 Prediabetes: Secondary | ICD-10-CM

## 2019-04-08 DIAGNOSIS — M7989 Other specified soft tissue disorders: Secondary | ICD-10-CM | POA: Insufficient documentation

## 2019-04-08 DIAGNOSIS — R0789 Other chest pain: Secondary | ICD-10-CM

## 2019-04-08 DIAGNOSIS — I10 Essential (primary) hypertension: Secondary | ICD-10-CM | POA: Diagnosis not present

## 2019-04-08 MED ORDER — FUROSEMIDE 20 MG PO TABS: ORAL_TABLET | ORAL | 1 refills | Status: DC

## 2019-04-08 MED ORDER — POTASSIUM CHLORIDE CRYS ER 20 MEQ PO TBCR: EXTENDED_RELEASE_TABLET | ORAL | 1 refills | Status: DC

## 2019-04-08 NOTE — Assessment & Plan Note (Signed)
2 months, intermittent furosemide as needed with poyassium

## 2019-04-08 NOTE — Assessment & Plan Note (Signed)
Controlled, no change in medication DASH diet and commitment to daily physical activity for a minimum of 30 minutes discussed and encouraged, as a part of hypertension management. The importance of attaining a healthy weight is also discussed.  BP/Weight 04/08/2019 02/11/2019 11/08/2018 07/09/2018 04/02/2018 12/20/2017 5/94/5859  Systolic BP 292 446 286 381 771 165 790  Diastolic BP 82 82 78 68 76 80 80  Wt. (Lbs) 202 199 197 196 191.08 194 204.75  BMI 33.61 33.12 32.78 32.62 31.8 32.28 34.07

## 2019-04-08 NOTE — Assessment & Plan Note (Addendum)
Referred to cardiology, EKG sinus bradycardia, no ischemia or LVH

## 2019-04-08 NOTE — Patient Instructions (Signed)
Follow-up as before call if you need me sooner.  Blood pressure today is excellent continue spironolactone 1 daily as before.  EKG in office today because of complaint of exertional chest tightness and fatigue.  You are also referred to cardiology for evaluation of this.  For leg and ankle swelling which is mild this is due to mainly to arthritis in the ankle joints.  For leg swelling when excessive furosemide and potassium have been prescribed to be taken at most 3 times weekly.    Continue to limit salt intake and focus on diet rich in fruits and vegetables. Thanks for choosing [facility], we consider it a privelige to serve you.

## 2019-04-10 ENCOUNTER — Other Ambulatory Visit (INDEPENDENT_AMBULATORY_CARE_PROVIDER_SITE_OTHER): Payer: Self-pay | Admitting: *Deleted

## 2019-04-10 DIAGNOSIS — Z1211 Encounter for screening for malignant neoplasm of colon: Secondary | ICD-10-CM

## 2019-04-13 ENCOUNTER — Encounter: Payer: Self-pay | Admitting: Family Medicine

## 2019-04-13 NOTE — Assessment & Plan Note (Signed)
  Patient re-educated about  the importance of commitment to a  minimum of 150 minutes of exercise per week as able.  The importance of healthy food choices with portion control discussed, as well as eating regularly and within a 12 hour window most days. The need to choose "clean , green" food 50 to 75% of the time is discussed, as well as to make water the primary drink and set a goal of 64 ounces water daily.    Weight /BMI 04/08/2019 02/11/2019 11/08/2018  WEIGHT 202 lb 199 lb 197 lb  HEIGHT 5\' 5"  5\' 5"  5\' 5"   BMI 33.61 kg/m2 33.12 kg/m2 32.78 kg/m2

## 2019-04-13 NOTE — Assessment & Plan Note (Signed)
Patient educated about the importance of limiting  Carbohydrate intake , the need to commit to daily physical activity for a minimum of 30 minutes , and to commit weight loss. The fact that changes in all these areas will reduce or eliminate all together the development of diabetes is stressed.   Had corrected when last checked Updated lab needed at/ before next visit.  Diabetic Labs Latest Ref Rng & Units 04/04/2019 02/12/2019 11/05/2018 12/14/2017 09/06/2016  HbA1c <5.7 % of total Hgb - - - 5.6 5.7(H)  Chol <200 mg/dL - - 117 100 123  HDL > OR = 50 mg/dL - - 43(L) 44(L) 52  Calc LDL mg/dL (calc) - - 61 42 58  Triglycerides <150 mg/dL - - 54 62 63  Creatinine 0.50 - 0.99 mg/dL 0.97 1.04(H) 1.00(H) 0.96 0.93   BP/Weight 04/08/2019 02/11/2019 11/08/2018 07/09/2018 04/02/2018 12/20/2017 08/29/7508  Systolic BP 258 527 782 423 536 144 315  Diastolic BP 82 82 78 68 76 80 80  Wt. (Lbs) 202 199 197 196 191.08 194 204.75  BMI 33.61 33.12 32.78 32.62 31.8 32.28 34.07   No flowsheet data found.

## 2019-04-13 NOTE — Progress Notes (Signed)
Tanya Rogers     MRN: [id number]      DOB: 08-27-1951   HPI Tanya Rogers is here for follow up and re-evaluation of chronic medical conditions, specifically hypertension medication management and review of any available recent lab and radiology data.  4 month h/o new exertional chest pain and leg swelling, denies PND or orthopnea  ROS Denies recent fever or chills. Denies sinus pressure, nasal congestion, ear pain or sore throat. Denies chest congestion, productive cough or wheezing.  Denies abdominal pain, nausea, vomiting,diarrhea or constipation.   Denies dysuria, frequency, hesitancy or incontinence. Denies joint pain, swelling and limitation in mobility. Denies headaches, seizures, numbness, or tingling. Denies depression, anxiety or insomnia. Denies skin break down or rash.   PE  BP 128/82   Pulse (!) 57   Temp 97.7 F (36.5 C)   Resp 16   Ht 5\' 5"  (1.651 m)   Wt 202 lb (91.6 kg)   BMI 33.61 kg/m   Patient alert and oriented and in no cardiopulmonary distress.  HEENT: No facial asymmetry, EOMI,   oropharynx pink and moist.  Neck supple no JVD, no mass.  Chest: Clear to auscultation bilaterally.  CVS: S1, S2 no murmurs, no S3.Regular rate. EKG: sinus bradycardia, no lVH , no ischemia  ABD: Soft non tender.   Ext: trace  Edema primarily around ankles and feet  MS: Adequate ROM spine, shoulders, hips and knees.  Skin: Intact, no ulcerations or rash noted.  Psych: Good eye contact, normal affect. Memory intact not anxious or depressed appearing.  CNS: CN 2-12 intact, power,  normal throughout.no focal deficits noted.   Assessment & Plan  Essential hypertension Controlled, no change in medication DASH diet and commitment to daily physical activity for a minimum of 30 minutes discussed and encouraged, as a part of hypertension management. The importance of attaining a healthy weight is also discussed.  BP/Weight 04/08/2019 02/11/2019 11/08/2018  07/09/2018 04/02/2018 12/20/2017 6/96/2952  Systolic BP 841 324 401 027 253 664 403  Diastolic BP 82 82 78 68 76 80 80  Wt. (Lbs) 202 199 197 196 191.08 194 204.75  BMI 33.61 33.12 32.78 32.62 31.8 32.28 34.07       Leg swelling 2 months, intermittent furosemide as needed with poyassium  Chest pain of uncertain etiology Referred to cardiology, EKG sinus bradycardia, no ischemia or LVH  Obesity  Patient re-educated about  the importance of commitment to a  minimum of 150 minutes of exercise per week as able.  The importance of healthy food choices with portion control discussed, as well as eating regularly and within a 12 hour window most days. The need to choose "clean , green" food 50 to 75% of the time is discussed, as well as to make water the primary drink and set a goal of 64 ounces water daily.    Weight /BMI 04/08/2019 02/11/2019 11/08/2018  WEIGHT 202 lb 199 lb 197 lb  HEIGHT 5\' 5"  5\' 5"  5\' 5"   BMI 33.61 kg/m2 33.12 kg/m2 32.78 kg/m2      Prediabetes Patient educated about the importance of limiting  Carbohydrate intake , the need to commit to daily physical activity for a minimum of 30 minutes , and to commit weight loss. The fact that changes in all these areas will reduce or eliminate all together the development of diabetes is stressed.   Had corrected when last checked Updated lab needed at/ before next visit.  Diabetic Labs Latest Ref Rng & Units  04/04/2019 02/12/2019 11/05/2018 12/14/2017 09/06/2016  HbA1c <5.7 % of total Hgb - - - 5.6 5.7(H)  Chol <200 mg/dL - - 161117 096100 045123  HDL > OR = 50 mg/dL - - 40(J43(L) 81(X44(L) 52  Calc LDL mg/dL (calc) - - 61 42 58  Triglycerides <150 mg/dL - - 54 62 63  Creatinine 0.50 - 0.99 mg/dL 9.140.97 7.82(N1.04(H) 5.62(Z1.00(H) 3.080.96 0.93   BP/Weight 04/08/2019 02/11/2019 11/08/2018 07/09/2018 04/02/2018 12/20/2017 10/12/2017  Systolic BP 128 148 126 132 114 110 160  Diastolic BP 82 82 78 68 76 80 80  Wt. (Lbs) 202 199 197 196 191.08 194 204.75  BMI 33.61 33.12  32.78 32.62 31.8 32.28 34.07   No flowsheet data found.

## 2019-04-15 ENCOUNTER — Other Ambulatory Visit: Payer: Self-pay

## 2019-04-15 ENCOUNTER — Encounter: Payer: Self-pay | Admitting: Cardiology

## 2019-04-15 ENCOUNTER — Ambulatory Visit (INDEPENDENT_AMBULATORY_CARE_PROVIDER_SITE_OTHER): Payer: Medicare HMO | Admitting: Cardiology

## 2019-04-15 VITALS — BP 112/65 | HR 60 | Temp 97.5°F | Ht 65.0 in | Wt 204.0 lb

## 2019-04-15 DIAGNOSIS — R079 Chest pain, unspecified: Secondary | ICD-10-CM

## 2019-04-15 MED ORDER — FUROSEMIDE 40 MG PO TABS: ORAL_TABLET | ORAL | 1 refills | Status: DC

## 2019-04-15 NOTE — Progress Notes (Signed)
Clinical Summary [name] is a 68 y.o.female seen as new consult, referred by Tanya Rogers for the following medical problems.  1. Chest pain - somewhat difficult for her to describe. Tightness midchest, 6/10 in severity. WOuld occur at rest. No other associated symptoms. Would last several hours at time. Not positional. Started few months, no change in severity in frequency. No relation to food.  - seemed to improve after starting aldactone  - chest pain has resolved, but ongoing fatigue. - does fast paced walking daily, notes some decreased energy with this.    2. Leg swelling - ongoing for 1 year. Takes lasix about every other day which is brand new - no orthopnea.  - up 5 lbs since 01/2019. Was 197 lbs 10/2018 - limiting sodium intake.    Past Medical History:  Diagnosis Date  . Glaucoma 2003 approx  . Obesity      Allergies  Allergen Reactions  . Other      Current Outpatient Medications  Medication Sig Dispense Refill  . Calcium Carb-Cholecalciferol (CALCIUM 1000 + D PO) Take 2 tablets by mouth daily.      . fish oil-omega-3 fatty acids 1000 MG capsule Take 2 g by mouth daily.      . furosemide (LASIX) 20 MG tablet Take 1 tablet by mouth 3 times weekly as needed for leg swelling 30 tablet 1  . Multiple Vitamin (MULTIVITAMIN) capsule Take 1 capsule by mouth daily.      . potassium chloride SA (K-DUR) 20 MEQ tablet Take 1 tablet by mouth 3 times weekly as needed.Take only on the days that you take furosemide 30 tablet 1  . spironolactone (ALDACTONE) 25 MG tablet Take 1 tablet (25 mg total) by mouth daily. 30 tablet 4  . timolol (BETIMOL) 0.5 % ophthalmic solution 1 drop 2 (two) times daily.     No current facility-administered medications for this visit.      Past Surgical History:  Procedure Laterality Date  . COLONOSCOPY N/A 04/15/2008  . EYE SURGERY Bilateral 12/2012 , 02/2013   [place]  . TUBAL LIGATION       Allergies  Allergen Reactions  .  Other       Family History  Problem Relation Age of Onset  . Heart disease Mother   . Hypertension Mother   . Stroke Mother   . Diabetes Mother   . Diabetes Father   . Hypertension Father   . Diabetes Brother   . Kidney disease Brother   . Diabetes Brother   . Diabetes Brother   . Cancer Brother        lung      Social History [name] reports that she has never smoked. She has never used smokeless tobacco. [name] has no history on file for alcohol.   Review of Systems CONSTITUTIONAL: No weight loss, fever, chills, weakness or fatigue.  HEENT: Eyes: No visual loss, blurred vision, double vision or yellow sclerae.No hearing loss, sneezing, congestion, runny nose or sore throat.  SKIN: No rash or itching.  CARDIOVASCULAR: per hpi RESPIRATORY: No shortness of breath, cough or sputum.  GASTROINTESTINAL: No anorexia, nausea, vomiting or diarrhea. No abdominal pain or blood.  GENITOURINARY: No burning on urination, no polyuria NEUROLOGICAL: No headache, dizziness, syncope, paralysis, ataxia, numbness or tingling in the extremities. No change in bowel or bladder control.  MUSCULOSKELETAL: No muscle, back pain, joint pain or stiffness.  LYMPHATICS: No enlarged nodes. No history of splenectomy.  PSYCHIATRIC:  No history of depression or anxiety.  ENDOCRINOLOGIC: No reports of sweating, cold or heat intolerance. No polyuria or polydipsia.  Marland Kitchen   Physical Examination Today's Vitals   04/15/19 1343  BP: 112/65  Pulse: 60  Temp: (!) 97.5 F (36.4 C)  Weight: 204 lb (92.5 kg)  Height: 5\' 5"  (1.651 m)   Body mass index is 33.95 kg/m.  Gen: resting comfortably, no acute distress HEENT: no scleral icterus, pupils equal round and reactive, no palptable cervical adenopathy,  CV: RRR, no m/r/g no jvd Resp: Clear to auscultation bilaterally GI: abdomen is soft, non-tender, non-distended, normal bowel sounds, no hepatosplenomegaly MSK: extremities are warm, no edema.   Skin: warm, no rash Neuro:  no focal deficits Psych: appropriate affect     Assessment and Plan  1. Chest pain  - atypical symptoms for ischemia, lasting hourse at a time. - given her LE edema and SOB/fatigue will start workup with echo. If dysfunction would reconsider ischemic testing at that time  - pcp EKG reviewed, mild sinus brady no acute ischemic changes  2. LE edema - obtain echo to further evaluate - increase lasix to 40mg  prn    F/u 2 months    Arnoldo Lenis, M.D.

## 2019-04-15 NOTE — Patient Instructions (Signed)
Medication Instructions: INCREASE Lasix to 40 mg daily as needed for swelling  Labwork: None  Procedures/Testing:Your physician has requested that you have an echocardiogram. Echocardiography is a painless test that uses sound waves to create images of your heart. It provides your doctor with information about the size and shape of your heart and how well your heart's chambers and valves are working. This procedure takes approximately one hour. There are no restrictions for this procedure.    Follow-Up: 2 months with MD or Physician Assistant  Any Additional Special Instructions Will Be Listed Below (If Applicable).     If you need a refill on your cardiac medications before your next appointment, please call your pharmacy.      Thank you for choosing [facility] !

## 2019-04-24 ENCOUNTER — Ambulatory Visit (INDEPENDENT_AMBULATORY_CARE_PROVIDER_SITE_OTHER): Payer: Medicare HMO

## 2019-04-24 ENCOUNTER — Other Ambulatory Visit: Payer: Self-pay

## 2019-04-24 DIAGNOSIS — R079 Chest pain, unspecified: Secondary | ICD-10-CM | POA: Diagnosis not present

## 2019-04-25 ENCOUNTER — Telehealth: Payer: Self-pay | Admitting: *Deleted

## 2019-04-25 NOTE — Telephone Encounter (Signed)
Pt aware - routed to pcp  

## 2019-04-25 NOTE — Telephone Encounter (Signed)
-----   Message from Herminio Commons, MD sent at 04/24/2019 12:31 PM EDT ----- Normal pumping function.

## 2019-04-25 NOTE — Telephone Encounter (Signed)
LM to return call.

## 2019-05-01 ENCOUNTER — Telehealth (INDEPENDENT_AMBULATORY_CARE_PROVIDER_SITE_OTHER): Payer: Self-pay | Admitting: *Deleted

## 2019-05-01 ENCOUNTER — Encounter (INDEPENDENT_AMBULATORY_CARE_PROVIDER_SITE_OTHER): Payer: Self-pay | Admitting: *Deleted

## 2019-05-01 DIAGNOSIS — Z1211 Encounter for screening for malignant neoplasm of colon: Secondary | ICD-10-CM

## 2019-05-01 MED ORDER — PEG 3350-KCL-NA BICARB-NACL 420 G PO SOLR: 4000.0000 mL | Freq: Once | ORAL | 0 refills | Status: AC

## 2019-05-01 NOTE — Telephone Encounter (Signed)
Patient needs trilyte TCS sch'd 10/28

## 2019-05-29 ENCOUNTER — Ambulatory Visit (INDEPENDENT_AMBULATORY_CARE_PROVIDER_SITE_OTHER): Payer: Self-pay

## 2019-05-29 ENCOUNTER — Telehealth (INDEPENDENT_AMBULATORY_CARE_PROVIDER_SITE_OTHER): Payer: Self-pay | Admitting: *Deleted

## 2019-05-29 ENCOUNTER — Other Ambulatory Visit: Payer: Self-pay

## 2019-05-29 NOTE — Telephone Encounter (Signed)
Referring MD/PCP: simpson   Procedure: tcs  Reason/Indication:  screening  Has patient had this procedure before?  Yes, 2009 - scanned  If so, when, by whom and where?    Is there a family history of colon cancer?  no  Who?  What age when diagnosed?    Is patient diabetic?   no      Does patient have prosthetic heart valve or mechanical valve?  no  Do you have a pacemaker/defibrillator?  no  Has patient ever had endocarditis/atrial fibrillation? no  Does patient use oxygen? no  Has patient had joint replacement within last 12 months?  no  Is patient constipated or do they take laxatives? no  Does patient have a history of alcohol/drug use?  no  Is patient on blood thinner such as Coumadin, Plavix and/or Aspirin? no  Medications: timolol 0.5%, black seed oil  Allergies: nkda  Medication Adjustment per Dr Charlena Cross, NP:   Procedure date & time: 06/26/19 at 830

## 2019-05-29 NOTE — Telephone Encounter (Signed)
Patient on schedule to see you tomorrw

## 2019-05-29 NOTE — Telephone Encounter (Signed)
Tanya Rogers, pt will need office visit prior to colonoscopy as she was having chest pain, sob and lower extremity edema in 03/2019. Echo was ok but she was to see her cardiologist 05/2019.

## 2019-05-30 ENCOUNTER — Ambulatory Visit (INDEPENDENT_AMBULATORY_CARE_PROVIDER_SITE_OTHER): Payer: Medicare HMO | Admitting: Nurse Practitioner

## 2019-05-30 ENCOUNTER — Encounter (INDEPENDENT_AMBULATORY_CARE_PROVIDER_SITE_OTHER): Payer: Self-pay | Admitting: Nurse Practitioner

## 2019-05-30 ENCOUNTER — Other Ambulatory Visit: Payer: Self-pay

## 2019-05-30 VITALS — BP 130/80 | HR 71 | Temp 97.8°F | Ht 64.0 in | Wt 203.3 lb

## 2019-05-30 DIAGNOSIS — Z1211 Encounter for screening for malignant neoplasm of colon: Secondary | ICD-10-CM

## 2019-05-30 NOTE — Patient Instructions (Signed)
1.  Proceed with your colonoscopy as scheduled  2.  Further follow-up to be determined after colonoscopy completed  3.  Do not take your Aldactone the morning of your colonoscopy, may take later in the day after your colonoscopy has been completed and when you are back to eating solid food and drinking liquids

## 2019-05-30 NOTE — Progress Notes (Signed)
Subjective:    Patient ID: [name], female    DOB: 12-03-1950, 68 y.o.   MRN: [id number]  HPI Roxana Carlena Ruybal is a 68 year old female with a past medical history of prediabetes, glaucoma and obesity. She was having chest pain/tightness, shortness of breath with edema to her lower extremities earlier this summer. She was seen by cardiologist Dr. Wyline Mood.  She was placed on Furosemide and her edema improved. She was later switched to Spironolactone. An ECHO 04/24/2019 was normal.  No further cardiac evaluation was recommended.  She presents today prior to proceeding with a screening colonoscopy. She denies having any further chest pain. No shortness of breath. She continues to have mild lower extremity edema.  She is passing a normal formed bowel movement most days.  She takes an over-the-counter natural bowel cleanser laxative as needed.  She denies having any rectal bleeding or black stools.  She underwent a colonoscopy by Dr. Karilyn Cota 04/16/2007 which was normal.  No family history of colorectal cancer.  Mother with history of CHF.  Echo 04/24/2019: 1. The left ventricle has normal systolic function, with an ejection fraction of 55-60%. The cavity size was normal. Left ventricular diastolic parameters were normal. No evidence of left ventricular regional wall motion abnormalities. 2. The right ventricle has normal systolic function. The cavity was normal. There is no increase in right ventricular wall thickness. Right ventricular systolic pressure is normal with an estimated pressure of 21.0 mmHg. 3. The aortic valve is tricuspid. Mild aortic annular calcification noted. 4. The mitral valve is grossly normal. There is mild mitral annular calcification present. 5. The tricuspid valve is grossly normal. 6. The aorta is normal unless otherwise noted.  Past Medical History:  Diagnosis Date  . Glaucoma 2003 approx  . Obesity    Past Surgical History:  Procedure Laterality Date  .  COLONOSCOPY N/A 04/15/2008  . EYE SURGERY Bilateral 12/2012 , 02/2013   [place]  . TUBAL LIGATION     Current Outpatient Medications on File Prior to Visit  Medication Sig Dispense Refill  . Calcium Carb-Cholecalciferol (CALCIUM 1000 + D PO) Take 2 tablets by mouth daily.      . Multiple Vitamin (MULTIVITAMIN) capsule Take 1 capsule by mouth daily.      Marland Kitchen spironolactone (ALDACTONE) 25 MG tablet Take 1 tablet (25 mg total) by mouth daily. 30 tablet 4  . timolol (BETIMOL) 0.5 % ophthalmic solution 1 drop 2 (two) times daily.    . furosemide (LASIX) 40 MG tablet Take 40 mg daily as needed for swelling (Patient not taking: Reported on 05/30/2019) 90 tablet 1  . potassium chloride SA (K-DUR) 20 MEQ tablet Take 1 tablet by mouth 3 times weekly as needed.Take only on the days that you take furosemide (Patient not taking: Reported on 05/30/2019) 30 tablet 1   No current facility-administered medications on file prior to visit.    Allergies  Allergen Reactions  . Other    Family History  Problem Relation Age of Onset  . Heart disease Mother   . Hypertension Mother   . Stroke Mother   . Diabetes Mother   . Diabetes Father   . Hypertension Father   . Diabetes Brother   . Kidney disease Brother   . Diabetes Brother   . Diabetes Brother   . Cancer Brother        lung    Social History   Socioeconomic History  . Marital status: Married    Spouse  name: Jeneen Rinks   . Number of children: 3  . Years of education: 12+  . Highest education level: 12th grade  Occupational History  . 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 Financial resource strain: Not hard at all  . Food insecurity    Worry: Never true    Inability: Never true  . Transportation needs    Medical: No    Non-medical: No  Tobacco Use  . Smoking status: Never Smoker  . Smokeless tobacco: Never Used  Substance and Sexual Activity  . Alcohol use: Never    Frequency: Never  . Drug use: No  . Sexual activity: Not  Currently  Lifestyle  . Physical activity    Days per week: 0 days    Minutes per session: 0 min  . Stress: Not at all  Relationships  . Social connections    Talks on phone: More than three times a week    Gets together: More than three times a week    Attends religious service: More than 4 times per year    Active member of club or organization: Yes    Attends meetings of clubs or organizations: More than 4 times per year    Relationship status: Married  . Intimate partner violence    Fear of current or ex partner: No    Emotionally abused: No    Physically abused: No    Forced sexual activity: No  Other Topics Concern  . Not on file  Social History Narrative  . Not on file   Review of Systems see HPI, all other systems reviewed and are negative    Objective:   Physical Exam Blood pressure 130/80, pulse 71, temperature 97.8 F (36.6 C), temperature source Oral, height 5\' 4"  (1.626 m), weight 203 lb 4.8 oz (92.2 kg). General: Well developed 68 year old female in no acute distress Eyes: Sclera nonicteric, conjunctiva pink Mouth: Dentition intact with few missing teeth, no ulcers or lesions Neck: Supple, no lymphadenopathy or thyromegaly Abdomen: Soft, nontender, no masses or organomegaly Extremities: Lower extremities with 1+ edema Neuro: Alert and oriented x4, no focal deficit    Assessment & Plan:   61.  68 year old female presents for colon cancer screening -Proceed with a screening colonoscopy as scheduled, colonoscopy benefits and risks discussed including risk with sedation, risk of bleeding, perforation and infection -Patient to hold spironolactone a.m. of colonoscopy, may take after procedure completed went back to eating solid foods and drinking fluids  2.  Hypertension  3.  Chest pain, resolved

## 2019-06-24 ENCOUNTER — Other Ambulatory Visit (HOSPITAL_COMMUNITY)
Admission: RE | Admit: 2019-06-24 | Discharge: 2019-06-24 | Disposition: A | Discharge status: Home / Self Care | Payer: Medicare HMO | Source: Ambulatory Visit | Attending: Internal Medicine | Admitting: Internal Medicine

## 2019-06-24 DIAGNOSIS — Z20828 Contact with and (suspected) exposure to other viral communicable diseases: Secondary | ICD-10-CM | POA: Insufficient documentation

## 2019-06-24 DIAGNOSIS — Z01812 Encounter for preprocedural laboratory examination: Secondary | ICD-10-CM | POA: Diagnosis present

## 2019-06-24 LAB — SARS CORONAVIRUS 2 (TAT 6-24 HRS): SARS Coronavirus 2: NEGATIVE

## 2019-06-26 ENCOUNTER — Encounter (HOSPITAL_COMMUNITY)
Admission: RE | Disposition: A | Discharge status: Home / Self Care | Payer: Self-pay | Source: Home / Self Care | Attending: Internal Medicine

## 2019-06-26 ENCOUNTER — Encounter (HOSPITAL_COMMUNITY): Payer: Self-pay | Admitting: *Deleted

## 2019-06-26 ENCOUNTER — Ambulatory Visit (HOSPITAL_COMMUNITY)
Admission: RE | Admit: 2019-06-26 | Discharge: 2019-06-26 | Disposition: A | Discharge status: Home / Self Care | Payer: Medicare HMO | Attending: Internal Medicine | Admitting: Internal Medicine

## 2019-06-26 ENCOUNTER — Other Ambulatory Visit: Payer: Self-pay

## 2019-06-26 DIAGNOSIS — Z1211 Encounter for screening for malignant neoplasm of colon: Secondary | ICD-10-CM | POA: Insufficient documentation

## 2019-06-26 DIAGNOSIS — H409 Unspecified glaucoma: Secondary | ICD-10-CM | POA: Diagnosis not present

## 2019-06-26 DIAGNOSIS — K6389 Other specified diseases of intestine: Secondary | ICD-10-CM | POA: Diagnosis not present

## 2019-06-26 HISTORY — PX: COLONOSCOPY: SHX5424

## 2019-06-26 SURGERY — COLONOSCOPY
Anesthesia: Moderate Sedation

## 2019-06-26 MED ORDER — MEPERIDINE HCL 50 MG/ML IJ SOLN
INTRAMUSCULAR | Status: DC | PRN
  Administered 2019-06-26 (×2): 25 mg via INTRAVENOUS

## 2019-06-26 MED ORDER — SODIUM CHLORIDE 0.9 % IV SOLN
INTRAVENOUS | Status: DC
  Administered 2019-06-26: 1000 mL via INTRAVENOUS

## 2019-06-26 MED ORDER — MEPERIDINE HCL 50 MG/ML IJ SOLN
INTRAMUSCULAR | Status: AC
  Filled 2019-06-26: qty 1

## 2019-06-26 MED ORDER — MIDAZOLAM HCL 5 MG/5ML IJ SOLN
INTRAMUSCULAR | Status: AC
  Filled 2019-06-26: qty 10

## 2019-06-26 MED ORDER — MIDAZOLAM HCL 5 MG/5ML IJ SOLN
INTRAMUSCULAR | Status: DC | PRN
  Administered 2019-06-26 (×2): 2 mg via INTRAVENOUS
  Administered 2019-06-26: 1 mg via INTRAVENOUS

## 2019-06-26 MED ORDER — STERILE WATER FOR IRRIGATION IR SOLN
Status: DC | PRN
  Administered 2019-06-26: 09:00:00 2.5 mL

## 2019-06-26 NOTE — H&P (Signed)
Tanya Rogers is an 68 y.o. female.   Chief Complaint: Patient is here for colonoscopy. HPI: Patient is 68 year old Afro-American female who is here for screening colonoscopy.  She denies abdominal pain change in bowel habits or rectal bleeding.  Last colonoscopy was normal in 2008. She does not take aspirin or anticoagulants. Family history is negative for CRC.  Past Medical History:  Diagnosis Date  . Glaucoma 2003 approx  . Obesity     Past Surgical History:  Procedure Laterality Date  . COLONOSCOPY N/A 04/15/2008  . EYE SURGERY Bilateral 12/2012 , 02/2013   [place], [place]  . TONSILLECTOMY    . TUBAL LIGATION      Family History  Problem Relation Age of Onset  . Heart disease Mother   . Hypertension Mother   . Stroke Mother   . Diabetes Mother   . Diabetes Father   . Hypertension Father   . Diabetes Brother   . Kidney disease Brother   . Diabetes Brother   . Diabetes Brother   . Cancer Brother        lung    Social History:  reports that she has never smoked. She has never used smokeless tobacco. She reports that she does not drink alcohol or use drugs.  Allergies: No Known Allergies  Medications Prior to Admission  Medication Sig Dispense Refill  . Ascorbic Acid (VITAMIN C) 1000 MG tablet Take 1,000 mg by mouth daily.    . Calcium Carb-Cholecalciferol (CALCIUM 1000 + D PO) Take 1 tablet by mouth daily.     Kendall Flack 575 MG/5ML SYRP Take 5 mLs by mouth daily. Apple Cider Vinegar Raw Honey Propolis    . furosemide (LASIX) 40 MG tablet Take 40 mg daily as needed for swelling (Patient taking differently: Take 40 mg by mouth daily as needed for fluid. Take 40 mg daily as needed for swelling) 90 tablet 1  . Multiple Vitamin (MULTIVITAMIN) capsule Take 1 capsule by mouth daily.      Marland Kitchen OVER THE COUNTER MEDICATION Take 16 oz by mouth daily. Drink to shrink    . potassium chloride SA (K-DUR) 20 MEQ tablet Take 1 tablet by mouth 3 times weekly as needed.Take  only on the days that you take furosemide (Patient taking differently: Take 20 mEq by mouth as needed (Take only on the days that you take furosemide). ) 30 tablet 1  . spironolactone (ALDACTONE) 25 MG tablet Take 1 tablet (25 mg total) by mouth daily. 30 tablet 4  . timolol (BETIMOL) 0.5 % ophthalmic solution Place 1 drop into both eyes daily.       No results found for this or any previous visit (from the past 48 hour(s)). No results found.  ROS  Blood pressure 134/69, pulse (!) 53, temperature 97.6 F (36.4 C), temperature source Axillary, resp. rate 13, height 5\' 5"  (1.651 m), weight 89.8 kg, SpO2 100 %. Physical Exam  Constitutional: She appears well-developed and well-nourished.  HENT:  Mouth/Throat: Oropharynx is clear and moist.  Eyes: Conjunctivae are normal. No scleral icterus.  Neck: No thyromegaly present.  Cardiovascular: Normal rate, regular rhythm and normal heart sounds.  No murmur heard. Respiratory: Effort normal and breath sounds normal.  GI: Soft. She exhibits no distension and no mass. There is no abdominal tenderness.  Musculoskeletal:        General: No edema.  Lymphadenopathy:    She has no cervical adenopathy.  Neurological: She is alert.  Skin: Skin is warm and  dry.     Assessment/Plan Average risk screening colonoscopy.  Lionel December, MD 06/26/2019, 8:41 AM

## 2019-06-26 NOTE — Op Note (Signed)
[facility] Patient Name: Tanya Rogers Procedure Date: 06/26/2019 8:12 AM MRN: [id number] Date of Birth: 1951/03/24 Attending MD: Hildred Laser , MD CSN: [id number] Age: 68 Admit Type: Outpatient Procedure:                Colonoscopy Indications:              Screening for colorectal malignant neoplasm Providers:                Hildred Laser, MD, Otis Peak B. Sharon Seller, RN, Raphael Gibney, Technician Referring MD:             Norwood Levo. Moshe Cipro, MD Medicines:                Meperidine 50 mg IV, Midazolam 5 mg IV Complications:            No immediate complications. Estimated Blood Loss:     Estimated blood loss: none. Procedure:                Pre-Anesthesia Assessment:                           - Prior to the procedure, a History and Physical                            was performed, and patient medications and                            allergies were reviewed. The patient's tolerance of                            previous anesthesia was also reviewed. The risks                            and benefits of the procedure and the sedation                            options and risks were discussed with the patient.                            All questions were answered, and informed consent                            was obtained. Prior Anticoagulants: The patient has                            taken no previous anticoagulant or antiplatelet                            agents. ASA Grade Assessment: II - A patient with                            mild systemic disease. After reviewing the risks  and benefits, the patient was deemed in                            satisfactory condition to undergo the procedure.                           After obtaining informed consent, the colonoscope                            was passed under direct vision. Throughout the                            procedure, the patient's blood pressure, pulse,  and                            oxygen saturations were monitored continuously. The                            [id number]-[id number] ([id number]) scope was introduced through                            the anus and advanced to the the cecum, identified                            by appendiceal orifice and ileocecal valve. The                            colonoscopy was somewhat difficult due to a                            redundant colon. Successful completion of the                            procedure was aided by changing the patient to a                            supine position, applying abdominal pressure and                            scope guide. The patient tolerated the procedure                            well. The quality of the bowel preparation was                            good. The ileocecal valve, appendiceal orifice, and                            rectum were photographed. Scope In: 8:52:31 AM Scope Out: 9:16:49 AM Scope Withdrawal Time: 0 hours 6 minutes 59 seconds  Total Procedure Duration: 0 hours 24 minutes 18 seconds  Findings:      The perianal and digital rectal examinations were normal.      A diffuse area of mild melanosis was  found in the entire colon.      The exam was otherwise normal throughout the examined colon.      The retroflexed view of the distal rectum and anal verge was normal and       showed no anal or rectal abnormalities. Impression:               - Melanosis in the colon.                           - No specimens collected. Moderate Sedation:      Moderate (conscious) sedation was administered by the endoscopy nurse       and supervised by the endoscopist. The following parameters were       monitored: oxygen saturation, heart rate, blood pressure, CO2       capnography and response to care. Total physician intraservice time was       31 minutes. Recommendation:           - Patient has a contact number available for                             emergencies. The signs and symptoms of potential                            delayed complications were discussed with the                            patient. Return to normal activities tomorrow.                            Written discharge instructions were provided to the                            patient.                           - High fiber diet today.                           - Continue present medications.                           - Repeat colonoscopy in 10 years for screening                            purposes. Procedure Code(s):        --- Professional ---                           [id number], Colonoscopy, flexible; diagnostic, including                            collection of specimen(s) by brushing or washing,                            when performed (separate procedure)  69485, Moderate sedation; each additional 15                            minutes intraservice time                           G0500, Moderate sedation services provided by the                            same physician or other qualified health care                            professional performing a gastrointestinal                            endoscopic service that sedation supports,                            requiring the presence of an independent trained                            observer to assist in the monitoring of the                            patient's level of consciousness and physiological                            status; initial 15 minutes of intra-service time;                            patient age 60 years or older (additional time may                            be reported with 46270, as appropriate) Diagnosis Code(s):        --- Professional ---                           Z12.11, Encounter for screening for malignant                            neoplasm of colon                           K63.89, Other specified diseases of intestine CPT copyright 2019 American  Medical Association. All rights reserved. The codes documented in this report are preliminary and upon coder review may  be revised to meet current compliance requirements. Lionel December, MD Lionel December, MD 06/26/2019 9:24:01 AM This report has been signed electronically. Number of Addenda: 0

## 2019-06-26 NOTE — Discharge Instructions (Signed)
Resume usual medications as before. °High-fiber diet. °No driving for 24 hours. °Next screening exam in 10 years. ° ° ° ° ° °Colonoscopy, Adult, Care After °This sheet gives you information about how to care for yourself after your procedure. Your doctor may also give you more specific instructions. If you have problems or questions, call your doctor. °What can I expect after the procedure? °After the procedure, it is common to have: °· A small amount of blood in your poop for 24 hours. °· Some gas. °· Mild cramping or bloating in your belly. °Follow these instructions at home: °General instructions °· For the first 24 hours after the procedure: °? Do not drive or use machinery. °? Do not sign important documents. °? Do not drink alcohol. °? Do your daily activities more slowly than normal. °? Eat foods that are soft and easy to digest. °· Take over-the-counter or prescription medicines only as told by your doctor. °To help cramping and bloating: ° °· Try walking around. °· Put heat on your belly (abdomen) as told by your doctor. Use a heat source that your doctor recommends, such as a moist heat pack or a heating pad. °? Put a towel between your skin and the heat source. °? Leave the heat on for 20-30 minutes. °? Remove the heat if your skin turns bright red. This is especially important if you cannot feel pain, heat, or cold. You can get burned. °Eating and drinking ° °· Drink enough fluid to keep your pee (urine) clear or pale yellow. °· Return to your normal diet as told by your doctor. Avoid heavy or fried foods that are hard to digest. °· Avoid drinking alcohol for as long as told by your doctor. °Contact a doctor if: °· You have blood in your poop (stool) 2-3 days after the procedure. °Get help right away if: °· You have more than a small amount of blood in your poop. °· You see large clumps of tissue (blood clots) in your poop. °· Your belly is swollen. °· You feel sick to your stomach (nauseous). °· You  throw up (vomit). °· You have a fever. °· You have belly pain that gets worse, and medicine does not help your pain. °Summary °· After the procedure, it is common to have a small amount of blood in your poop. You may also have mild cramping and bloating in your belly. °· For the first 24 hours after the procedure, do not drive or use machinery, do not sign important documents, and do not drink alcohol. °· Get help right away if you have a lot of blood in your poop, feel sick to your stomach, have a fever, or have more belly pain. °This information is not intended to replace advice given to you by your health care provider. Make sure you discuss any questions you have with your health care provider. °Document Released: 09/17/2010 Document Revised: 06/15/2017 Document Reviewed: 05/09/2016 °Elsevier Patient Education © 2020 [employer]. ° ° ° °High-Fiber Diet °Fiber, also called dietary fiber, is a type of carbohydrate that is found in fruits, vegetables, whole grains, and beans. A high-fiber diet can have many health benefits. Your health care provider may recommend a high-fiber diet to help: °· Prevent constipation. Fiber can make your bowel movements more regular. °· Lower your cholesterol. °· Relieve the following conditions: °? Swelling of veins in the anus (hemorrhoids). °? Swelling and irritation (inflammation) of specific areas of the digestive tract (uncomplicated diverticulosis). °? A problem of   the large intestine (colon) that sometimes causes pain and diarrhea (irritable bowel syndrome, IBS). °· Prevent overeating as part of a weight-loss plan. °· Prevent heart disease, type 2 diabetes, and certain cancers. °What is my plan? °The recommended daily fiber intake in grams (g) includes: °· 38 g for men age 50 or younger. °· 30 g for men over age 50. °· 25 g for women age 50 or younger. °· 21 g for women over age 50. °You can get the recommended daily intake of dietary fiber by: °· Eating a variety of fruits,  vegetables, grains, and beans. °· Taking a fiber supplement, if it is not possible to get enough fiber through your diet. °What do I need to know about a high-fiber diet? °· It is better to get fiber through food sources rather than from fiber supplements. There is not a lot of research about how effective supplements are. °· Always check the fiber content on the nutrition facts label of any prepackaged food. Look for foods that contain 5 g of fiber or more per serving. °· Talk with a diet and nutrition specialist (dietitian) if you have questions about specific foods that are recommended or not recommended for your medical condition, especially if those foods are not listed below. °· Gradually increase how much fiber you consume. If you increase your intake of dietary fiber too quickly, you may have bloating, cramping, or gas. °· Drink plenty of water. Water helps you to digest fiber. °What are tips for following this plan? °· Eat a wide variety of high-fiber foods. °· Make sure that half of the grains that you eat each day are whole grains. °· Eat breads and cereals that are made with whole-grain flour instead of refined flour or white flour. °· Eat brown rice, bulgur wheat, or millet instead of white rice. °· Start the day with a breakfast that is high in fiber, such as a cereal that contains 5 g of fiber or more per serving. °· Use beans in place of meat in soups, salads, and pasta dishes. °· Eat high-fiber snacks, such as berries, raw vegetables, nuts, and popcorn. °· Choose whole fruits and vegetables instead of processed forms like juice or sauce. °What foods can I eat? ° °Fruits °Berries. Pears. Apples. Oranges. Avocado. Prunes and raisins. Dried figs. °Vegetables °Sweet potatoes. Spinach. Kale. Artichokes. Cabbage. Broccoli. Cauliflower. Green peas. Carrots. Squash. °Grains °Whole-grain breads. Multigrain cereal. Oats and oatmeal. Brown rice. Barley. Bulgur wheat. Millet. Quinoa. Bran muffins. Popcorn. Rye  wafer crackers. °Meats and other proteins °Navy, kidney, and pinto beans. Soybeans. Split peas. Lentils. Nuts and seeds. °Dairy °Fiber-fortified yogurt. °Beverages °Fiber-fortified soy milk. Fiber-fortified orange juice. °Other foods °Fiber bars. °The items listed above may not be a complete list of recommended foods and beverages. Contact a dietitian for more options. °What foods are not recommended? °Fruits °Fruit juice. Cooked, strained fruit. °Vegetables °Fried potatoes. Canned vegetables. Well-cooked vegetables. °Grains °White bread. Pasta made with refined flour. White rice. °Meats and other proteins °Fatty cuts of meat. Fried chicken or fried fish. °Dairy °Milk. Yogurt. Cream cheese. Sour cream. °Fats and oils °Butters. °Beverages °Soft drinks. °Other foods °Cakes and pastries. °The items listed above may not be a complete list of foods and beverages to avoid. Contact a dietitian for more information. °Summary °· Fiber is a type of carbohydrate. It is found in fruits, vegetables, whole grains, and beans. °· There are many health benefits of eating a high-fiber diet, such as preventing constipation, lowering blood   cholesterol, helping with weight loss, and reducing your risk of heart disease, diabetes, and certain cancers. °· Gradually increase your intake of fiber. Increasing too fast can result in cramping, bloating, and gas. Drink plenty of water while you increase your fiber. °· The best sources of fiber include whole fruits and vegetables, whole grains, nuts, seeds, and beans. °This information is not intended to replace advice given to you by your health care provider. Make sure you discuss any questions you have with your health care provider. °Document Released: 08/15/2005 Document Revised: 06/19/2017 Document Reviewed: 06/19/2017 °Elsevier Patient Education © 2020 [employer] ° ° °

## 2019-06-28 ENCOUNTER — Encounter (HOSPITAL_COMMUNITY): Payer: Self-pay | Admitting: Internal Medicine

## 2019-07-02 ENCOUNTER — Ambulatory Visit: Payer: Medicare HMO | Admitting: Cardiology

## 2019-07-03 ENCOUNTER — Telehealth: Payer: Medicare HMO | Admitting: Cardiology

## 2019-07-03 NOTE — Progress Notes (Unsigned)
{Choose 1 Note Type (Telehealth Visit or Telephone Visit):[phone]}   Date:  07/03/2019   ID:  [name], DOB 19-May-1951, MRN [id number]  {Patient Location:[phone]::"Home"} {Provider Location:[phone]::"Home"}  PCP:  Kerri Perches, MD  Cardiologist:  No primary care provider on file. *** Electrophysiologist:  None   Evaluation Performed:  {Choose Visit [phone]::"Follow-Up Visit"}  Chief Complaint:  ***  History of Present Illness:    Tanya Rogers is a 68 y.o. female with ***   1. Chest pain - somewhat difficult for her to describe. Tightness midchest, 6/10 in severity. WOuld occur at rest. No other associated symptoms. Would last several hours at time. Not positional. Started few months, no change in severity in frequency. No relation to food.  - seemed to improve after starting aldactone  - chest pain has resolved, but ongoing fatigue. - does fast paced walking daily, notes some decreased energy with this.   ?stress???  2. Leg swelling - ongoing for 1 year. Takes lasix about every other day which is brand new - no orthopnea.  - up 5 lbs since 01/2019. Was 197 lbs 10/2018 - limiting sodium intake.    03/2019 echo LVEF 55-60%, normal diastolic function.      The patient {does/does not:200015} have symptoms concerning for COVID-19 infection (fever, chills, cough, or new shortness of breath).    Past Medical History:  Diagnosis Date  . Glaucoma 2003 approx  . Obesity    Past Surgical History:  Procedure Laterality Date  . COLONOSCOPY N/A 04/15/2008  . COLONOSCOPY N/A 06/26/2019   Procedure: COLONOSCOPY;  Surgeon: Malissa Hippo, MD;  [facility]: [facility];  Service: Endoscopy;  Laterality: N/A;  830  . EYE SURGERY Bilateral 12/2012 , 02/2013   [place], cataract  . TONSILLECTOMY    . TUBAL LIGATION       No outpatient medications have been marked as taking for the 07/03/19 encounter (Appointment) with Antoine Poche,  MD.     Allergies:   Patient has no known allergies.   Social History   Tobacco Use  . Smoking status: Never Smoker  . Smokeless tobacco: Never Used  Substance Use Topics  . Alcohol use: Never    Frequency: Never  . Drug use: No     Family Hx: The patient's family history includes Cancer in her brother; Diabetes in her brother, brother, brother, father, and mother; Heart disease in her mother; Hypertension in her father and mother; Kidney disease in her brother; Stroke in her mother.  ROS:   Please see the history of present illness.    *** All other systems reviewed and are negative.   Prior CV studies:   The following studies were reviewed today:  03/2019 echo IMPRESSIONS    1. The left ventricle has normal systolic function, with an ejection fraction of 55-60%. The cavity size was normal. Left ventricular diastolic parameters were normal. No evidence of left ventricular regional wall motion abnormalities.  2. The right ventricle has normal systolic function. The cavity was normal. There is no increase in right ventricular wall thickness. Right ventricular systolic pressure is normal with an estimated pressure of 21.0 mmHg.  3. The aortic valve is tricuspid. Mild aortic annular calcification noted.  4. The mitral valve is grossly normal. There is mild mitral annular calcification present.  5. The tricuspid valve is grossly normal.  6. The aorta is normal unless otherwise noted.  Labs/Other Tests and Data Reviewed:    EKG:  {EKG/Telemetry Strips Reviewed:[phone]}  Recent Labs: 11/05/2018: Hemoglobin 12.3; Platelets 171 04/04/2019: BUN 22; Creat 0.97; Potassium 5.2; Sodium 139   Recent Lipid Panel Lab Results  Component Value Date/Time   CHOL 117 11/05/2018 09:07 AM   TRIG 54 11/05/2018 09:07 AM   HDL 43 (L) 11/05/2018 09:07 AM   CHOLHDL 2.7 11/05/2018 09:07 AM   LDLCALC 61 11/05/2018 09:07 AM    Wt Readings from Last 3 Encounters:  06/26/19 198 lb (89.8 kg)   05/30/19 203 lb 4.8 oz (92.2 kg)  04/15/19 204 lb (92.5 kg)     Objective:    Vital Signs:  There were no vitals taken for this visit.   {HeartCare Virtual Exam (Optional):[phone]::"VITAL SIGNS:  reviewed"}  ASSESSMENT & PLAN:    1. Chest pain  - atypical symptoms for ischemia, lasting hourse at a time. - given her LE edema and SOB/fatigue will start workup with echo. If dysfunction would reconsider ischemic testing at that time  - pcp EKG reviewed, mild sinus brady no acute ischemic changes  2. LE edema - obtain echo to further evaluate - increase lasix to 40mg  prn    COVID-19 Education: The signs and symptoms of COVID-19 were discussed with the patient and how to seek care for testing (follow up with PCP or arrange E-visit).  ***The importance of social distancing was discussed today.  Time:   Today, I have spent *** minutes with the patient with telehealth technology discussing the above problems.     Medication Adjustments/Labs and Tests Ordered: Current medicines are reviewed at length with the patient today.  Concerns regarding medicines are outlined above.   Tests Ordered: No orders of the defined types were placed in this encounter.   Medication Changes: No orders of the defined types were placed in this encounter.   Follow Up:  {F/U Format:[phone]} {follow up:15908}  Signed, Carlyle Dolly, MD  07/03/2019 9:27 AM    [facility]

## 2019-07-04 ENCOUNTER — Encounter: Payer: Self-pay | Admitting: Cardiology

## 2019-07-15 ENCOUNTER — Ambulatory Visit: Payer: Managed Care, Other (non HMO)

## 2019-07-18 ENCOUNTER — Encounter: Payer: Self-pay | Admitting: Family Medicine

## 2019-07-18 ENCOUNTER — Ambulatory Visit (INDEPENDENT_AMBULATORY_CARE_PROVIDER_SITE_OTHER): Payer: Medicare HMO | Admitting: Family Medicine

## 2019-07-18 ENCOUNTER — Other Ambulatory Visit: Payer: Self-pay

## 2019-07-18 VITALS — BP 130/80 | HR 71 | Resp 15 | Ht 64.0 in | Wt 203.0 lb

## 2019-07-18 DIAGNOSIS — Z0000 Encounter for general adult medical examination without abnormal findings: Secondary | ICD-10-CM | POA: Diagnosis not present

## 2019-07-18 NOTE — Patient Instructions (Signed)
Tanya Rogers , Thank you for taking time to come for your Medicare Wellness Visit. I appreciate your ongoing commitment to your health goals. Please review the following plan we discussed and let me know if I can assist you in the future.   Please continue to practice social distancing to keep you, your family, and our community safe.  If you must go out, please wear a Mask and practice good handwashing.  We hope that you have a safe, happy, healthy Holiday Season !!  Feel free to make a flu shot appointment! :)  Screening recommendations/referrals: Colonoscopy: Up-to-date Mammogram: Up-to-date  bone Density: Up-to-date Recommended yearly ophthalmology/optometry visit for glaucoma screening and checkup Recommended yearly dental visit for hygiene and checkup  Vaccinations: Influenza vaccine: Needs Pneumococcal vaccine: Up-to-date Tdap vaccine: Up-to-date Shingles vaccine: Completed zoster  Advanced directives: Please let us know if you have any questions were happy to help.  Conditions/risks identified: Falls, continue exercise will help prevent this great job  Next appointment: 11/12/2019 (also needs flu shot appt)    Preventive Care 65 Years and Older, Female Preventive care refers to lifestyle choices and visits with your health care provider that can promote health and wellness. What does preventive care include?  A yearly physical exam. This is also called an annual well check.  Dental exams once or twice a year.  Routine eye exams. Ask your health care provider how often you should have your eyes checked.  Personal lifestyle choices, including:  Daily care of your teeth and gums.  Regular physical activity.  Eating a healthy diet.  Avoiding tobacco and drug use.  Limiting alcohol use.  Practicing safe sex.  Taking low-dose aspirin every day.  Taking vitamin and mineral supplements as recommended by your health care provider. What happens during an annual  well check? The services and screenings done by your health care provider during your annual well check will depend on your age, overall health, lifestyle risk factors, and family history of disease. Counseling  Your health care provider may ask you questions about your:  Alcohol use.  Tobacco use.  Drug use.  Emotional well-being.  Home and relationship well-being.  Sexual activity.  Eating habits.  History of falls.  Memory and ability to understand (cognition).  Work and work Statistician.  Reproductive health. Screening  You may have the following tests or measurements:  Height, weight, and BMI.  Blood pressure.  Lipid and cholesterol levels. These may be checked every 5 years, or more frequently if you are over 36 years old.  Skin check.  Lung cancer screening. You may have this screening every year starting at age 30 if you have a 30-pack-year history of smoking and currently smoke or have quit within the past 15 years.  Fecal occult blood test (FOBT) of the stool. You may have this test every year starting at age 21.  Flexible sigmoidoscopy or colonoscopy. You may have a sigmoidoscopy every 5 years or a colonoscopy every 10 years starting at age 66.  Hepatitis C blood test.  Hepatitis B blood test.  Sexually transmitted disease (STD) testing.  Diabetes screening. This is done by checking your blood sugar (glucose) after you have not eaten for a while (fasting). You may have this done every 1-3 years.  Bone density scan. This is done to screen for osteoporosis. You may have this done starting at age 61.  Mammogram. This may be done every 1-2 years. Talk to your health care provider about how often you  should have regular mammograms. Talk with your health care provider about your test results, treatment options, and if necessary, the need for more tests. Vaccines  Your health care provider may recommend certain vaccines, such as:  Influenza vaccine. This  is recommended every year.  Tetanus, diphtheria, and acellular pertussis (Tdap, Td) vaccine. You may need a Td booster every 10 years.  Zoster vaccine. You may need this after age 91.  Pneumococcal 13-valent conjugate (PCV13) vaccine. One dose is recommended after age 25.  Pneumococcal polysaccharide (PPSV23) vaccine. One dose is recommended after age 38. Talk to your health care provider about which screenings and vaccines you need and how often you need them. This information is not intended to replace advice given to you by your health care provider. Make sure you discuss any questions you have with your health care provider. Document Released: 09/11/2015 Document Revised: 05/04/2016 Document Reviewed: 06/16/2015 Elsevier Interactive Patient Education  2017 [employer] Prevention in the Home Falls can cause injuries. They can happen to people of all ages. There are many things you can do to make your home safe and to help prevent falls. What can I do on the outside of my home?  Regularly fix the edges of walkways and driveways and fix any cracks.  Remove anything that might make you trip as you walk through a door, such as a raised step or threshold.  Trim any bushes or trees on the path to your home.  Use bright outdoor lighting.  Clear any walking paths of anything that might make someone trip, such as rocks or tools.  Regularly check to see if handrails are loose or broken. Make sure that both sides of any steps have handrails.  Any raised decks and porches should have guardrails on the edges.  Have any leaves, snow, or ice cleared regularly.  Use sand or salt on walking paths during winter.  Clean up any spills in your garage right away. This includes oil or grease spills. What can I do in the bathroom?  Use night lights.  Install grab bars by the toilet and in the tub and shower. Do not use towel bars as grab bars.  Use non-skid mats or decals in the tub or  shower.  If you need to sit down in the shower, use a plastic, non-slip stool.  Keep the floor dry. Clean up any water that spills on the floor as soon as it happens.  Remove soap buildup in the tub or shower regularly.  Attach bath mats securely with double-sided non-slip rug tape.  Do not have throw rugs and other things on the floor that can make you trip. What can I do in the bedroom?  Use night lights.  Make sure that you have a light by your bed that is easy to reach.  Do not use any sheets or blankets that are too big for your bed. They should not hang down onto the floor.  Have a firm chair that has side arms. You can use this for support while you get dressed.  Do not have throw rugs and other things on the floor that can make you trip. What can I do in the kitchen?  Clean up any spills right away.  Avoid walking on wet floors.  Keep items that you use a lot in easy-to-reach places.  If you need to reach something above you, use a strong step stool that has a grab bar.  Keep electrical cords out  of the way.  Do not use floor polish or wax that makes floors slippery. If you must use wax, use non-skid floor wax.  Do not have throw rugs and other things on the floor that can make you trip. What can I do with my stairs?  Do not leave any items on the stairs.  Make sure that there are handrails on both sides of the stairs and use them. Fix handrails that are broken or loose. Make sure that handrails are as long as the stairways.  Check any carpeting to make sure that it is firmly attached to the stairs. Fix any carpet that is loose or worn.  Avoid having throw rugs at the top or bottom of the stairs. If you do have throw rugs, attach them to the floor with carpet tape.  Make sure that you have a light switch at the top of the stairs and the bottom of the stairs. If you do not have them, ask someone to add them for you. What else can I do to help prevent falls?   Wear shoes that:  Do not have high heels.  Have rubber bottoms.  Are comfortable and fit you well.  Are closed at the toe. Do not wear sandals.  If you use a stepladder:  Make sure that it is fully opened. Do not climb a closed stepladder.  Make sure that both sides of the stepladder are locked into place.  Ask someone to hold it for you, if possible.  Clearly mark and make sure that you can see:  Any grab bars or handrails.  First and last steps.  Where the edge of each step is.  Use tools that help you move around (mobility aids) if they are needed. These include:  Canes.  Walkers.  Scooters.  Crutches.  Turn on the lights when you go into a dark area. Replace any light bulbs as soon as they burn out.  Set up your furniture so you have a clear path. Avoid moving your furniture around.  If any of your floors are uneven, fix them.  If there are any pets around you, be aware of where they are.  Review your medicines with your doctor. Some medicines can make you feel dizzy. This can increase your chance of falling. Ask your doctor what other things that you can do to help prevent falls. This information is not intended to replace advice given to you by your health care provider. Make sure you discuss any questions you have with your health care provider. Document Released: 06/11/2009 Document Revised: 01/21/2016 Document Reviewed: 09/19/2014 Elsevier Interactive Patient Education  2017 [employer].

## 2019-07-18 NOTE — Progress Notes (Signed)
Subjective:   Tanya Rogers is a 68 y.o. female who presents for Medicare Annual (Subsequent) preventive examination.  Location of Patient: Home Location of Provider: Telehealth Consent was obtain for visit to be over via telehealth.  I verified that I am speaking with the correct person using two identifiers.   Review of Systems:    Cardiac Risk Factors include: advanced age (>6155men, 67>65 women);dyslipidemia;hypertension;obesity (BMI >30kg/m2)     Objective:     Vitals: BP 130/80   Pulse 71   Resp 15   Ht 5\' 4"  (1.626 m)   Wt 203 lb (92.1 kg)   BMI 34.84 kg/m   Body mass index is 34.84 kg/m.  Advanced Directives 06/26/2019 07/09/2018  Does Patient Have a Medical Advance Directive? No No  Would patient like information on creating a medical advance directive? Yes (MAU/Ambulatory/Procedural Areas - Information given) No - Patient declined    Tobacco Social History   Tobacco Use  Smoking Status Never Smoker  Smokeless Tobacco Never Used     Counseling given: Yes   Clinical Intake:  Pre-visit preparation completed: Yes  Pain : No/denies pain Pain Score: 0-No pain     BMI - recorded: 34.84 Nutritional Status: BMI > 30  Obese Nutritional Risks: None Diabetes: No  How often do you need to have someone help you when you read instructions, pamphlets, or other written materials from your doctor or pharmacy?: 1 - Never What is the last grade level you completed in school?: some college  Interpreter Needed?: No     Past Medical History:  Diagnosis Date  . Glaucoma 2003 approx  . Obesity    Past Surgical History:  Procedure Laterality Date  . COLONOSCOPY N/A 04/15/2008  . COLONOSCOPY N/A 06/26/2019   Procedure: COLONOSCOPY;  Surgeon: Malissa Hippoehman, Najeeb U, MD;  [facility];  Service: Endoscopy;  Laterality: N/A;  830  . EYE SURGERY Bilateral 12/2012 , 02/2013   [place], cataract  . TONSILLECTOMY    . TUBAL LIGATION     Family History   Problem Relation Age of Onset  . Heart disease Mother   . Hypertension Mother   . Stroke Mother   . Diabetes Mother   . Diabetes Father   . Hypertension Father   . Diabetes Brother   . Kidney disease Brother   . Diabetes Brother   . Diabetes Brother   . Cancer Brother        lung    Social History   Socioeconomic History  . Marital status: Married    Spouse name: Fayrene FearingJames   . Number of children: 3  . Years of education: 12+  . Highest education level: 12th grade  Occupational History  . 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 Financial resource strain: Not hard at all  . Food insecurity    Worry: Never true    Inability: Never true  . Transportation needs    Medical: No    Non-medical: No  Tobacco Use  . Smoking status: Never Smoker  . Smokeless tobacco: Never Used  Substance and Sexual Activity  . Alcohol use: Never    Frequency: Never  . Drug use: No  . Sexual activity: Not Currently  Lifestyle  . Physical activity    Days per week: 0 days    Minutes per session: 0 min  . Stress: Not at all  Relationships  . Social connections    Talks on phone: More than three times  a week    Gets together: More than three times a week    Attends religious service: More than 4 times per year    Active member of club or organization: Yes    Attends meetings of clubs or organizations: More than 4 times per year    Relationship status: Married  Other Topics Concern  . Not on file  Social History Narrative  . Not on file    Outpatient Encounter Medications as of 07/18/2019  Medication Sig  . Ascorbic Acid (VITAMIN C) 1000 MG tablet Take 1,000 mg by mouth daily.  . Calcium Carb-Cholecalciferol (CALCIUM 1000 + D PO) Take 1 tablet by mouth daily.   Kendall Flack 575 MG/5ML SYRP Take 5 mLs by mouth daily. Apple Cider Vinegar Raw Honey Propolis  . furosemide (LASIX) 40 MG tablet Take 40 mg daily as needed for swelling (Patient taking differently: Take 40 mg by mouth  daily as needed for fluid. Take 40 mg daily as needed for swelling)  . Multiple Vitamin (MULTIVITAMIN) capsule Take 1 capsule by mouth daily.    Marland Kitchen OVER THE COUNTER MEDICATION Take 16 oz by mouth daily. Drink to shrink  . potassium chloride SA (K-DUR) 20 MEQ tablet Take 1 tablet by mouth 3 times weekly as needed.Take only on the days that you take furosemide (Patient taking differently: Take 20 mEq by mouth as needed (Take only on the days that you take furosemide). )  . spironolactone (ALDACTONE) 25 MG tablet Take 1 tablet (25 mg total) by mouth daily.  . timolol (BETIMOL) 0.5 % ophthalmic solution Place 1 drop into both eyes daily.    No facility-administered encounter medications on file as of 07/18/2019.     Activities of Daily Living In your present state of health, do you have any difficulty performing the following activities: 07/18/2019  Hearing? N  Vision? N  Difficulty concentrating or making decisions? N  Walking or climbing stairs? N  Dressing or bathing? N  Doing errands, shopping? N  Preparing Food and eating ? N  Using the Toilet? N  In the past six months, have you accidently leaked urine? N  Do you have problems with loss of bowel control? N  Managing your Medications? N  Managing your Finances? N  Housekeeping or managing your Housekeeping? N  Some recent data might be hidden    Patient Care Team: Fayrene Helper, MD as PCP - General    Assessment:   This is a routine wellness examination for Tanya Rogers.  Exercise Activities and Dietary recommendations Current Exercise Habits: Home exercise routine, Type of exercise: walking, Time (Minutes): 60, Frequency (Times/Week): 4, Weekly Exercise (Minutes/Week): 240, Intensity: Intense, Exercise limited by: None identified  Goals    . DIET - EAT MORE FRUITS AND VEGETABLES    . Increase physical activity       Fall Risk Fall Risk  07/18/2019 04/08/2019 02/11/2019 01/10/2019 12/11/2018  Falls in the past year? 0 0 0 0 0   Number falls in past yr: 0 0 0 - 0  Injury with Fall? 0 0 0 0 0   Is the patient's home free of loose throw rugs in walkways, pet beds, electrical cords, etc?   yes      Grab bars in the bathroom? yes      Handrails on the stairs?   yes      Adequate lighting?   yes     Depression Screen PHQ 2/9 Scores 07/18/2019 04/08/2019 02/11/2019 01/10/2019  PHQ - 2 Score 0 0 0 0     Cognitive Function     6CIT Screen 07/18/2019 07/09/2018  What Year? 0 points 0 points  What month? 0 points 0 points  What time? 0 points 0 points  Count back from 20 0 points 0 points  Months in reverse 0 points 0 points  Repeat phrase 0 points 0 points  Total Score 0 0    Immunization History  Administered Date(s) Administered  . Pneumococcal Conjugate-13 09/06/2016  . Pneumococcal Polysaccharide-23 12/20/2017  . Tdap 08/17/2011  . Zoster 08/17/2011    Qualifies for Shingles Vaccine?   Screening Tests Health Maintenance  Topic Date Due  . INFLUENZA VACCINE  03/30/2019  . MAMMOGRAM  01/29/2021  . TETANUS/TDAP  08/16/2021  . COLONOSCOPY  06/25/2029  . DEXA SCAN  Completed  . Hepatitis C Screening  Completed  . PNA vac Low Risk Adult  Completed    Cancer Screenings: Lung: Low Dose CT Chest recommended if Age 29-80 years, 30 pack-year currently smoking OR have quit w/in 15years. Patient does not qualify. Breast:  Up to date on Mammogram? Yes   Up to date of Bone Density/Dexa? Yes Colorectal:  Due 2030  Additional Screenings:   Hepatitis C Screening: completed     Plan:       1. Encounter for Medicare annual wellness exam   I have personally reviewed and noted the following in the patient's chart:   . Medical and social history . Use of alcohol, tobacco or illicit drugs  . Current medications and supplements . Functional ability and status . Nutritional status . Physical activity . Advanced directives . List of other physicians . Hospitalizations, surgeries, and ER visits in  previous 12 months . Vitals . Screenings to include cognitive, depression, and falls . Referrals and appointments  In addition, I have reviewed and discussed with patient certain preventive protocols, quality metrics, and best practice recommendations. A written personalized care plan for preventive services as well as general preventive health recommendations were provided to patient.     I provided 20 minutes of non-face-to-face time during this encounter.    Freddy Finner, NP  07/18/2019

## 2019-08-13 ENCOUNTER — Other Ambulatory Visit: Payer: Self-pay | Admitting: Family Medicine

## 2019-10-15 ENCOUNTER — Other Ambulatory Visit: Payer: Self-pay | Admitting: Cardiology

## 2019-11-12 ENCOUNTER — Encounter: Payer: Medicare HMO | Admitting: Family Medicine

## 2019-11-19 ENCOUNTER — Encounter: Payer: Self-pay | Admitting: Family Medicine

## 2019-11-19 ENCOUNTER — Other Ambulatory Visit: Payer: Self-pay

## 2019-11-19 ENCOUNTER — Ambulatory Visit (INDEPENDENT_AMBULATORY_CARE_PROVIDER_SITE_OTHER): Payer: Medicare HMO | Admitting: Family Medicine

## 2019-11-19 VITALS — BP 129/77 | HR 63 | Temp 97.6°F | Ht 65.0 in | Wt 198.0 lb

## 2019-11-19 DIAGNOSIS — H6121 Impacted cerumen, right ear: Secondary | ICD-10-CM | POA: Diagnosis not present

## 2019-11-19 DIAGNOSIS — M65332 Trigger finger, left middle finger: Secondary | ICD-10-CM | POA: Insufficient documentation

## 2019-11-19 MED ORDER — MELOXICAM 7.5 MG PO TABS: 7.5000 mg | ORAL_TABLET | Freq: Every day | ORAL | 0 refills | Status: DC

## 2019-11-19 NOTE — Progress Notes (Signed)
Established Patient Office Visit  Subjective:  Patient ID: Tanya Rogers, female    DOB: 1951-08-10  Age: 69 y.o. MRN: [id number]  CC:  Chief Complaint  Patient presents with  . Hand Pain    left hand middle finger is locking up on me and I am losing grip with some painin the base of hand been going on for 3 weeks now    HPI Nayelie Illa Level presents for left middle finger pain with loss of grip strength. Middle finger episodically will not completely open. No trauma. No increase in activity or change in use of hands. 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 No prior h/o of trigger finger. No h/o of arthritis Right ear pain -worse at night with laying down. No h/o congestion . NO h/o PE tubes of hole in TM. Pt states she does not use Q tips  Past Medical History:  Diagnosis Date  . Glaucoma 2003 approx  . Obesity     Past Surgical History:  Procedure Laterality Date  . COLONOSCOPY N/A 04/15/2008  . COLONOSCOPY N/A 06/26/2019   Procedure: COLONOSCOPY;  Surgeon: Malissa Hippo, MD;  Location: [facility];  Service: Endoscopy;  Laterality: N/A;  830  . EYE SURGERY Bilateral 12/2012 , 02/2013   [place], cataract  . TONSILLECTOMY    . TUBAL LIGATION      Family History  Problem Relation Age of Onset  . Heart disease Mother   . Hypertension Mother   . Stroke Mother   . Diabetes Mother   . Diabetes Father   . Hypertension Father   . Diabetes Brother   . Kidney disease Brother   . Diabetes Brother   . Diabetes Brother   . Cancer Brother        lung     Social History   Socioeconomic History  . Marital status: Married    Spouse name: Fayrene Fearing   . Number of children: 3  . Years of education: 12+  . Highest education level: 12th grade  Occupational History  . 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 Smoking status: Never Smoker  . Smokeless tobacco: Never Used  Substance and Sexual Activity  . Alcohol use: Never   . Drug use: No  . Sexual activity: Not Currently  Other Topics Concern  . Not on file  Social History Narrative  . Not on file   Social Determinants of Health   Financial Resource Strain:   . Difficulty of Paying Living Expenses:   Food Insecurity:   . Worried About Programme researcher, broadcasting/film/video in the Last Year:   . Barista in the Last Year:   Transportation Needs:   . Freight forwarder (Medical):   Marland Kitchen Lack of Transportation (Non-Medical):   Physical Activity:   . Days of Exercise per Week:   . Minutes of Exercise per Session:   Stress:   . Feeling of Stress :   Social Connections:   . Frequency of Communication with Friends and Family:   . Frequency of Social Gatherings with Friends and Family:   . Attends Religious Services:   . Active Member of Clubs or Organizations:   . Attends Banker Meetings:   Marland Kitchen Marital Status:   Intimate Partner Violence:   . Fear of Current or Ex-Partner:   . Emotionally Abused:   Marland Kitchen Physically Abused:   . Sexually Abused:  Outpatient Medications Prior to Visit  Medication Sig Dispense Refill  . Ascorbic Acid (VITAMIN C) 1000 MG tablet Take 1,000 mg by mouth daily.    . Calcium Carb-Cholecalciferol (CALCIUM 1000 + D PO) Take 1 tablet by mouth daily.     . dorzolamide-timolol (COSOPT) 22.3-6.8 MG/ML ophthalmic solution 1 drop 2 (two) times daily.    Lucila Maine 575 MG/5ML SYRP Take 5 mLs by mouth daily. Apple Cider Vinegar Raw Honey Propolis    . furosemide (LASIX) 40 MG tablet TAKE 40 MG DAILY AS NEEDED FOR SWELLING - NEW DOSE 90 tablet 1  . Multiple Vitamin (MULTIVITAMIN) capsule Take 1 capsule by mouth daily.      Marland Kitchen OVER THE COUNTER MEDICATION Take 16 oz by mouth daily. Drink to shrink    . potassium chloride SA (K-DUR) 20 MEQ tablet Take 1 tablet by mouth 3 times weekly as needed.Take only on the days that you take furosemide (Patient taking differently: Take 20 mEq by mouth as needed (Take only on the days that you  take furosemide). ) 30 tablet 1  . spironolactone (ALDACTONE) 25 MG tablet TAKE 1 TABLET BY MOUTH EVERY DAY 30 tablet 4  . timolol (BETIMOL) 0.5 % ophthalmic solution Place 1 drop into both eyes daily.      No facility-administered medications prior to visit.    No Known Allergies  ROS Review of Systems  HENT: Positive for ear pain. Negative for congestion, ear discharge and sinus pain.   Eyes: Negative.   Musculoskeletal: Positive for arthralgias.       Left hand middle finger      Objective:    Physical Exam  Constitutional: She appears well-developed and well-nourished.  HENT:  Head: Normocephalic and atraumatic.  Left Ear: External ear normal.  Cerumen filled canal-right. Unable to extract manually or with water/peroxide. Large amount of cerumen noted on TM  Eyes: Conjunctivae are normal.  Musculoskeletal:     Cervical back: Normal range of motion.     Comments: Left 3rd finger tenderness PIP joint and MP joint    BP 129/77 (BP Location: Right Arm, Patient Position: Sitting, Cuff Size: Large)   Pulse 63   Temp 97.6 F (36.4 C) (Temporal)   Ht 5\' 5"  (1.651 m)   Wt 198 lb (89.8 kg)   SpO2 98%   BMI 32.95 kg/m  Wt Readings from Last 3 Encounters:  11/19/19 198 lb (89.8 kg)  07/18/19 203 lb (92.1 kg)  06/26/19 198 lb (89.8 kg)     There are no preventive care reminders to display for this patient.  There are no preventive care reminders to display for this patient.  Lab Results  Component Value Date   TSH 0.75 12/14/2017   Lab Results  Component Value Date   WBC 4.7 11/05/2018   HGB 12.3 11/05/2018   HCT 36.2 11/05/2018   MCV 90.0 11/05/2018   PLT 171 11/05/2018   Lab Results  Component Value Date   NA 139 04/04/2019   K 5.2 04/04/2019   CO2 30 04/04/2019   GLUCOSE 97 04/04/2019   BUN 22 04/04/2019   CREATININE 0.97 04/04/2019   BILITOT 0.5 12/20/2016   ALKPHOS 62 12/20/2016   AST 31 12/20/2016   ALT 34 (H) 12/20/2016   PROT 7.0 12/20/2016    ALBUMIN 3.8 12/20/2016   CALCIUM 9.3 04/04/2019   Lab Results  Component Value Date   CHOL 117 11/05/2018   Lab Results  Component Value Date   HDL  43 (L) 11/05/2018   Lab Results  Component Value Date   LDLCALC 61 11/05/2018   Lab Results  Component Value Date   TRIG 54 11/05/2018   Lab Results  Component Value Date   CHOLHDL 2.7 11/05/2018   Lab Results  Component Value Date   HGBA1C 5.6 12/14/2017      Assessment & Plan:  1. Trigger middle finger of left hand mobic 7.5mg -trial Pt trial-call if no improvement for hand speciality referral-possible injection d/w pt. Avoid repetitive tasks for 2 weeks  2. Cerumen debris on tympanic membrane, right Suggested wax softener, return to clinic if no improvement next week. Use cool air to dry ear at home  Follow-up: call for hand referral if no improvement/return for wax removal after softening   Dalayza Zambrana Hannah Beat, MD

## 2019-11-19 NOTE — Patient Instructions (Addendum)
   Trial of mobic daily for 2 weeks with food Check blood pressure as medication can elevated blood pressure Wax softener for ear was -obtain over the counter If you have lab work done today you will be contacted with your lab results within the next 2 weeks.  If you have not heard from [place] then please contact us. The fastest way to get your results is to register for My Chart.   IF you received an x-ray today, you will receive an invoice from [facility]. Please contact [facility] at [phone] with questions or concerns regarding your invoice.   IF you received labwork today, you will receive an invoice from [place]. Please contact [facility] at [phone] with questions or concerns regarding your invoice.   Our billing staff will not be able to assist you with questions regarding bills from these companies.  You will be contacted with the lab results as soon as they are available. The fastest way to get your results is to activate your My Chart account. Instructions are located on the last page of this paperwork. If you have not heard from [place] regarding the results in 2 weeks, please contact this office.

## 2019-11-20 DIAGNOSIS — H6121 Impacted cerumen, right ear: Secondary | ICD-10-CM | POA: Insufficient documentation

## 2019-12-12 ENCOUNTER — Other Ambulatory Visit: Payer: Self-pay | Admitting: Family Medicine

## 2019-12-13 ENCOUNTER — Telehealth: Payer: Self-pay

## 2019-12-13 NOTE — Telephone Encounter (Signed)
Pt is calling to state that her hand is still hurting and she wants to go to a GOOD hand specialist, doesn't care where the provider is   [facility]

## 2020-01-02 NOTE — Telephone Encounter (Signed)
Pt referral sent to Dr Magnus Ivan they should reach out with an appt

## 2020-01-24 ENCOUNTER — Other Ambulatory Visit (HOSPITAL_COMMUNITY): Payer: Self-pay | Admitting: Family Medicine

## 2020-01-24 DIAGNOSIS — Z1231 Encounter for screening mammogram for malignant neoplasm of breast: Secondary | ICD-10-CM

## 2020-02-04 ENCOUNTER — Other Ambulatory Visit: Payer: Self-pay | Admitting: Family Medicine

## 2020-02-04 ENCOUNTER — Telehealth: Payer: Self-pay

## 2020-02-04 NOTE — Telephone Encounter (Signed)
Has appt for annual exam next week, will address at that visit

## 2020-02-04 NOTE — Telephone Encounter (Signed)
Pt stopped by to see if Dr Lodema Hong would order a complete thyroid panel.

## 2020-02-05 NOTE — Telephone Encounter (Signed)
Pt advised of this with verbal understanding

## 2020-02-06 ENCOUNTER — Other Ambulatory Visit: Payer: Self-pay | Admitting: Family Medicine

## 2020-02-07 ENCOUNTER — Ambulatory Visit (HOSPITAL_COMMUNITY)
Admission: RE | Admit: 2020-02-07 | Discharge: 2020-02-07 | Disposition: A | Discharge status: Home / Self Care | Payer: Medicare HMO | Source: Ambulatory Visit | Attending: Family Medicine | Admitting: Family Medicine

## 2020-02-07 ENCOUNTER — Other Ambulatory Visit: Payer: Self-pay

## 2020-02-07 DIAGNOSIS — Z1231 Encounter for screening mammogram for malignant neoplasm of breast: Secondary | ICD-10-CM | POA: Diagnosis not present

## 2020-02-12 ENCOUNTER — Other Ambulatory Visit (HOSPITAL_COMMUNITY): Payer: Self-pay | Admitting: Family Medicine

## 2020-02-12 ENCOUNTER — Encounter: Payer: Medicare HMO | Admitting: Family Medicine

## 2020-02-12 DIAGNOSIS — R928 Other abnormal and inconclusive findings on diagnostic imaging of breast: Secondary | ICD-10-CM

## 2020-02-18 ENCOUNTER — Ambulatory Visit (INDEPENDENT_AMBULATORY_CARE_PROVIDER_SITE_OTHER): Payer: Medicare HMO | Admitting: Family Medicine

## 2020-02-18 ENCOUNTER — Other Ambulatory Visit: Payer: Self-pay

## 2020-02-18 ENCOUNTER — Encounter: Payer: Self-pay | Admitting: Family Medicine

## 2020-02-18 VITALS — BP 118/78 | HR 56 | Temp 96.7°F | Resp 15 | Ht 65.0 in | Wt 197.1 lb

## 2020-02-18 DIAGNOSIS — Z0000 Encounter for general adult medical examination without abnormal findings: Secondary | ICD-10-CM | POA: Diagnosis not present

## 2020-02-18 DIAGNOSIS — Z6833 Body mass index (BMI) 33.0-33.9, adult: Secondary | ICD-10-CM

## 2020-02-18 DIAGNOSIS — Z13220 Encounter for screening for lipoid disorders: Secondary | ICD-10-CM

## 2020-02-18 DIAGNOSIS — Z780 Asymptomatic menopausal state: Secondary | ICD-10-CM

## 2020-02-18 DIAGNOSIS — E6609 Other obesity due to excess calories: Secondary | ICD-10-CM

## 2020-02-18 DIAGNOSIS — E559 Vitamin D deficiency, unspecified: Secondary | ICD-10-CM

## 2020-02-18 NOTE — Assessment & Plan Note (Signed)

## 2020-02-18 NOTE — Patient Instructions (Signed)
F/U with Md in 6 months, call if you need me sooner  Please get your covid vaccines  Please get cBC, cmp and eGFr, lipid panel , tSH and vit D today  You are referred for bone density (please try and schedule the same day she has her mammogram)  It is important that you exercise regularly at least 30 minutes 5 times a week. If you develop chest pain, have severe difficulty breathing, or feel very tired, stop exercising immediately and seek medical attention   Think about what you will eat, plan ahead. Choose " clean, green, fresh or frozen" over canned, processed or packaged foods which are more sugary, salty and fatty. 70 to 75% of food eaten should be vegetables and fruit. Three meals at set times with snacks allowed between meals, but they must be fruit or vegetables. Aim to eat over a 12 hour period , example 7 am to 7 pm, and STOP after  your last meal of the day. Drink water,generally about 64 ounces per day, no other drink is as healthy. Fruit juice is best enjoyed in a healthy way, by EATING the fruit. Weight loss goal of 5 to 7 pounds in next 6 months

## 2020-02-21 ENCOUNTER — Encounter: Payer: Self-pay | Admitting: Family Medicine

## 2020-02-21 LAB — COMPLETE METABOLIC PANEL WITH GFR
AG Ratio: 1.6 (calc) (ref 1.0–2.5)
ALT: 25 U/L (ref 6–29)
AST: 23 U/L (ref 10–35)
Albumin: 4.2 g/dL (ref 3.6–5.1)
Alkaline phosphatase (APISO): 77 U/L (ref 37–153)
BUN: 13 mg/dL (ref 7–25)
CO2: 27 mmol/L (ref 20–32)
Calcium: 9.2 mg/dL (ref 8.6–10.4)
Chloride: 107 mmol/L (ref 98–110)
Creat: 0.84 mg/dL (ref 0.50–0.99)
GFR, Est African American: 83 mL/min/{1.73_m2} (ref >=60)
GFR, Est Non African American: 71 mL/min/{1.73_m2} (ref >=60)
Globulin: 2.6 g/dL (calc) (ref 1.9–3.7)
Glucose, Bld: 88 mg/dL (ref 65–99)
Potassium: 4.3 mmol/L (ref 3.5–5.3)
Sodium: 139 mmol/L (ref 135–146)
Total Bilirubin: 0.5 mg/dL (ref 0.2–1.2)
Total Protein: 6.8 g/dL (ref 6.1–8.1)

## 2020-02-21 LAB — CBC
HCT: 39.7 % (ref 35.0–45.0)
Hemoglobin: 13 g/dL (ref 11.7–15.5)
MCH: 30.4 pg (ref 27.0–33.0)
MCHC: 32.7 g/dL (ref 32.0–36.0)
MCV: 93 fL (ref 80.0–100.0)
MPV: 11.8 fL (ref 7.5–12.5)
Platelets: 176 10*3/uL (ref 140–400)
RBC: 4.27 10*6/uL (ref 3.80–5.10)
RDW: 12.8 % (ref 11.0–15.0)
WBC: 5.1 10*3/uL (ref 3.8–10.8)

## 2020-02-21 LAB — LIPID PANEL
Cholesterol: 124 mg/dL (ref <200)
HDL: 53 mg/dL (ref >=50)
LDL Cholesterol (Calc): 58 mg/dL (calc)
Non-HDL Cholesterol (Calc): 71 mg/dL (calc) (ref <130)
Total CHOL/HDL Ratio: 2.3 (calc) (ref <5.0)
Triglycerides: 53 mg/dL (ref <150)

## 2020-02-21 LAB — TSH: TSH: 1.45 mIU/L (ref 0.40–4.50)

## 2020-02-21 LAB — VITAMIN D 25 HYDROXY (VIT D DEFICIENCY, FRACTURES): Vit D, 25-Hydroxy: 43 ng/mL (ref 30–100)

## 2020-02-21 NOTE — Progress Notes (Signed)
    Tanya Rogers     MRN: [id number]      DOB: Sep 21, 1950  HPI: Patient is in for annual physical exam. Immunization is reviewed , and  She is encouraged to get covid vaccine   PE: BP 118/78   Pulse (!) 56   Temp (!) 96.7 F (35.9 C) (Temporal)   Resp 15   Ht 5\' 5"  (1.651 m)   Wt 197 lb 1.9 oz (89.4 kg)   SpO2 98%   BMI 32.80 kg/m   Pleasant  female, alert and oriented x 3, in no cardio-pulmonary distress. Afebrile. HEENT No facial trauma or asymetry. Sinuses non tender.  Extra occullar muscles intact.. External ears normal, . Neck: supple, no adenopathy,JVD or thyromegaly.No bruits.  Chest: Clear to ascultation bilaterally.No crackles or wheezes. Non tender to palpation  Breast: No asymetry,no masses or lumps. No tenderness. No nipple discharge or inversion. No axillary or supraclavicular adenopathy  Cardiovascular system; Heart sounds normal,  S1 and  S2 ,no S3.  No murmur, or thrill. Apical beat not displaced Peripheral pulses normal.  Abdomen: Soft, non tender, no organomegaly or masses. No bruits. Bowel sounds normal. No guarding, tenderness or rebound.   GU: Asymptomatic, not examined  Musculoskeletal exam: Full ROM of spine, hips , shoulders and knees. No deformity ,swelling or crepitus noted. No muscle wasting or atrophy.   Neurologic: Cranial nerves 2 to 12 intact. Power, tone ,sensation and reflexes normal throughout. No disturbance in gait. No tremor.  Skin: Intact, no ulceration, erythema , scaling or rash noted. Pigmentation normal throughout  Psych; Normal mood and affect. Judgement and concentration normal   Assessment & Plan:  Annual physical exam Annual exam as documented. Counseling done  re healthy lifestyle involving commitment to 150 minutes exercise per week, heart healthy diet, and attaining healthy weight.The importance of adequate sleep also discussed. Regular seat belt use and home safety, is also  discussed. Changes in health habits are decided on by the patient with goals and time frames  set for achieving them. Immunization and cancer screening needs are specifically addressed at this visit.

## 2020-02-21 NOTE — Assessment & Plan Note (Signed)
  Patient re-educated about  the importance of commitment to a  minimum of 150 minutes of exercise per week as able.  The importance of healthy food choices with portion control discussed, as well as eating regularly and within a 12 hour window most days. The need to choose "clean , green" food 50 to 75% of the time is discussed, as well as to make water the primary drink and set a goal of 64 ounces water daily.    Weight /BMI 02/18/2020 11/19/2019 07/18/2019  WEIGHT 197 lb 1.9 oz 198 lb 203 lb  HEIGHT 5\' 5"  5\' 5"  5\' 4"   BMI 32.8 kg/m2 32.95 kg/m2 34.84 kg/m2

## 2020-02-25 ENCOUNTER — Other Ambulatory Visit: Payer: Self-pay

## 2020-02-25 ENCOUNTER — Ambulatory Visit (HOSPITAL_COMMUNITY)
Admission: RE | Admit: 2020-02-25 | Discharge: 2020-02-25 | Disposition: A | Discharge status: Home / Self Care | Payer: Medicare HMO | Source: Ambulatory Visit | Attending: Family Medicine | Admitting: Family Medicine

## 2020-02-25 ENCOUNTER — Encounter (HOSPITAL_COMMUNITY): Payer: Self-pay

## 2020-02-25 DIAGNOSIS — Z780 Asymptomatic menopausal state: Secondary | ICD-10-CM

## 2020-02-25 DIAGNOSIS — R928 Other abnormal and inconclusive findings on diagnostic imaging of breast: Secondary | ICD-10-CM | POA: Diagnosis present

## 2020-02-26 ENCOUNTER — Other Ambulatory Visit: Payer: Self-pay

## 2020-02-26 DIAGNOSIS — Z87898 Personal history of other specified conditions: Secondary | ICD-10-CM

## 2020-04-07 ENCOUNTER — Ambulatory Visit (INDEPENDENT_AMBULATORY_CARE_PROVIDER_SITE_OTHER): Payer: Medicare HMO | Admitting: Family Medicine

## 2020-04-07 ENCOUNTER — Encounter: Payer: Self-pay | Admitting: Family Medicine

## 2020-04-07 ENCOUNTER — Other Ambulatory Visit: Payer: Self-pay

## 2020-04-07 VITALS — BP 118/72 | HR 60 | Resp 16 | Ht 65.0 in | Wt 203.0 lb

## 2020-04-07 DIAGNOSIS — E6609 Other obesity due to excess calories: Secondary | ICD-10-CM | POA: Diagnosis not present

## 2020-04-07 DIAGNOSIS — L309 Dermatitis, unspecified: Secondary | ICD-10-CM

## 2020-04-07 DIAGNOSIS — M7989 Other specified soft tissue disorders: Secondary | ICD-10-CM

## 2020-04-07 DIAGNOSIS — S90569A Insect bite (nonvenomous), unspecified ankle, initial encounter: Secondary | ICD-10-CM

## 2020-04-07 DIAGNOSIS — Z6833 Body mass index (BMI) 33.0-33.9, adult: Secondary | ICD-10-CM

## 2020-04-07 DIAGNOSIS — W57XXXA Bitten or stung by nonvenomous insect and other nonvenomous arthropods, initial encounter: Secondary | ICD-10-CM

## 2020-04-07 MED ORDER — BETAMETHASONE DIPROPIONATE 0.05 % EX CREA: TOPICAL_CREAM | Freq: Two times a day (BID) | CUTANEOUS | 1 refills | Status: DC

## 2020-04-07 MED ORDER — PREDNISONE 5 MG PO TABS
5.0000 mg | ORAL_TABLET | Freq: Two times a day (BID) | ORAL | 0 refills | Status: AC
Start: 2020-04-07 — End: 2020-04-12

## 2020-04-07 MED ORDER — CEPHALEXIN 500 MG PO CAPS
500.0000 mg | ORAL_CAPSULE | Freq: Three times a day (TID) | ORAL | 0 refills | Status: AC
Start: 2020-04-07 — End: 2020-04-12

## 2020-04-07 MED ORDER — HYDROXYZINE HCL 10 MG PO TABS: 10.0000 mg | ORAL_TABLET | Freq: Three times a day (TID) | ORAL | 1 refills | Status: DC | PRN

## 2020-04-07 NOTE — Patient Instructions (Addendum)
F/u as before, call if you need me sooner  You are treated for dermatitis and insect bites, antibiotic, medication for itching, short course of prednisone and a steroid cream are prescribed  VERY IMPORTANT NOT TO SCRATCH AS YOU MAY GET BACTERIAL INFECTION of the skin  It is important that you exercise regularly at least 30 minutes 5 times a week. If you develop chest pain, have severe difficulty breathing, or feel very tired, stop exercising immediately and seek medical attention   Think about what you will eat, plan ahead. Choose " clean, green, fresh or frozen" over canned, processed or packaged foods which are more sugary, salty and fatty. 70 to 75% of food eaten should be vegetables and fruit. Three meals at set times with snacks allowed between meals, but they must be fruit or vegetables. Aim to eat over a 12 hour period , example 7 am to 7 pm, and STOP after  your last meal of the day. Drink water,generally about 64 ounces per day, no other drink is as healthy. Fruit juice is best enjoyed in a healthy way, by EATING the fruit.  Thanks for choosing [facility], we consider it a privelige to serve you.

## 2020-04-08 ENCOUNTER — Other Ambulatory Visit: Payer: Self-pay | Admitting: Cardiology

## 2020-04-12 ENCOUNTER — Encounter: Payer: Self-pay | Admitting: Family Medicine

## 2020-04-12 DIAGNOSIS — W57XXXA Bitten or stung by nonvenomous insect and other nonvenomous arthropods, initial encounter: Secondary | ICD-10-CM | POA: Insufficient documentation

## 2020-04-12 NOTE — Progress Notes (Signed)
   Tanya Rogers     MRN: [id number]      DOB: [birth year]   HPI [name] is herewith a c/o swelling , pain and fine bumps around her ankles. No new personal care or cleaning products. Tend sto recur during hot months Denies fever, chills or drainage for area  ROS Denies recent fever or chills. Denies sinus pressure, nasal congestion, ear pain or sore throat. Denies chest congestion, productive cough or wheezing. Denies chest pains, palpitations and leg swelling Denies abdominal pain, nausea, vomiting,diarrhea or constipation.   Denies dysuria, frequency, hesitancy or incontinence. Denies joint pain, swelling and limitation in mobility.    PE  BP 118/72   Pulse 60   Resp 16   Ht 5\' 5"  (1.651 m)   Wt 203 lb (92.1 kg)   SpO2 100%   BMI 33.78 kg/m   Patient alert and oriented and in no cardiopulmonary distress.  HEENT: No facial asymmetry, EOMI,     Neck supple .  Chest: Clear to auscultation bilaterally.  CVS: S1, S2 no murmurs, no S3.Regular rate.  ABD: Soft non tender.   Ext: one plus pitting edema around ankles  MS: Adequate ROM spine, shoulders, hips and knees.  Skin: erythema,sores where pt has been scratching and insect bites noted around ankles and up legs  Psych: Good eye contact, normal affect. Memory intact not anxious or depressed appearing.  CNS: CN 2-12 intact, power,  normal throughout.no focal deficits noted.   Assessment & Plan  Insect bites Keflex prescribed  Dermatitis Oral anti hoistamine and short course of prednisone prescribed also topical steroid  Leg swelling As needed lasix and potassium, salt reduction and leg elevation  Obesity  Patient re-educated about  the importance of commitment to a  minimum of 150 minutes of exercise per week as able.  The importance of healthy food choices with portion control discussed, as well as eating regularly and within a 12 hour window most days. The need to choose "clean , green" food  50 to 75% of the time is discussed, as well as to make water the primary drink and set a goal of 64 ounces water daily.    Weight /BMI 04/07/2020 02/18/2020 11/19/2019  WEIGHT 203 lb 197 lb 1.9 oz 198 lb  HEIGHT 5\' 5"  5\' 5"  5\' 5"   BMI 33.78 kg/m2 32.8 kg/m2 32.95 kg/m2

## 2020-04-12 NOTE — Assessment & Plan Note (Signed)
Keflex prescribed.

## 2020-04-12 NOTE — Assessment & Plan Note (Addendum)
Oral anti hoistamine and short course of prednisone prescribed also topical steroid

## 2020-04-12 NOTE — Assessment & Plan Note (Signed)
  Patient re-educated about  the importance of commitment to a  minimum of 150 minutes of exercise per week as able.  The importance of healthy food choices with portion control discussed, as well as eating regularly and within a 12 hour window most days. The need to choose "clean , green" food 50 to 75% of the time is discussed, as well as to make water the primary drink and set a goal of 64 ounces water daily.    Weight /BMI 04/07/2020 02/18/2020 11/19/2019  WEIGHT 203 lb 197 lb 1.9 oz 198 lb  HEIGHT 5\' 5"  5\' 5"  5\' 5"   BMI 33.78 kg/m2 32.8 kg/m2 32.95 kg/m2

## 2020-04-12 NOTE — Assessment & Plan Note (Signed)
As needed lasix and potassium, salt reduction and leg elevation

## 2020-04-14 ENCOUNTER — Other Ambulatory Visit: Payer: Self-pay | Admitting: Family Medicine

## 2020-04-16 ENCOUNTER — Other Ambulatory Visit: Payer: Self-pay | Admitting: Family Medicine

## 2020-05-09 ENCOUNTER — Other Ambulatory Visit: Payer: Self-pay | Admitting: Cardiology

## 2020-07-21 ENCOUNTER — Other Ambulatory Visit: Payer: Self-pay

## 2020-07-21 ENCOUNTER — Ambulatory Visit (INDEPENDENT_AMBULATORY_CARE_PROVIDER_SITE_OTHER): Payer: Medicare HMO | Admitting: Family Medicine

## 2020-07-21 ENCOUNTER — Encounter: Payer: Self-pay | Admitting: Family Medicine

## 2020-07-21 VITALS — BP 118/72 | Ht 65.0 in | Wt 203.0 lb

## 2020-07-21 DIAGNOSIS — Z0000 Encounter for general adult medical examination without abnormal findings: Secondary | ICD-10-CM

## 2020-07-21 NOTE — Patient Instructions (Addendum)
Tanya Rogers , Thank you for taking time to come for your Medicare Wellness Visit. I appreciate your ongoing commitment to your health goals. Please review the following plan we discussed and let me know if I can assist you in the future.   Screening recommendations/referrals: Colonoscopy: 06/25/29 Mammogram: 02/06/22 Bone Density: Complete Recommended yearly ophthalmology/optometry visit for glaucoma screening and checkup Recommended yearly dental visit for hygiene and checkup  Vaccinations: Pneumococcal vaccine: Complete Influenza vaccine: Declined Tdap vaccine: 08/16/21  Shingles vaccine: Declined  Advanced directives: No    Conditions/risks identified: None  Next appointment: 08/19/20 @ 10:20 am   Preventive Care 65 Years and Older, Female Preventive care refers to lifestyle choices and visits with your health care provider that can promote health and wellness. What does preventive care include?  A yearly physical exam. This is also called an annual well check.  Dental exams once or twice a year.  Routine eye exams. Ask your health care provider how often you should have your eyes checked.  Personal lifestyle choices, including:  Daily care of your teeth and gums.  Regular physical activity.  Eating a healthy diet.  Avoiding tobacco and drug use.  Limiting alcohol use.  Practicing safe sex.  Taking low-dose aspirin every day.  Taking vitamin and mineral supplements as recommended by your health care provider. What happens during an annual well check? The services and screenings done by your health care provider during your annual well check will depend on your age, overall health, lifestyle risk factors, and family history of disease. Counseling  Your health care provider may ask you questions about your:  Alcohol use.  Tobacco use.  Drug use.  Emotional well-being.  Home and relationship well-being.  Sexual activity.  Eating habits.  History  of falls.  Memory and ability to understand (cognition).  Work and work Astronomer.  Reproductive health. Screening  You may have the following tests or measurements:  Height, weight, and BMI.  Blood pressure.  Lipid and cholesterol levels. These may be checked every 5 years, or more frequently if you are over 67 years old.  Skin check.  Lung cancer screening. You may have this screening every year starting at age 26 if you have a 30-pack-year history of smoking and currently smoke or have quit within the past 15 years.  Fecal occult blood test (FOBT) of the stool. You may have this test every year starting at age 70.  Flexible sigmoidoscopy or colonoscopy. You may have a sigmoidoscopy every 5 years or a colonoscopy every 10 years starting at age 50.  Hepatitis C blood test.  Hepatitis B blood test.  Sexually transmitted disease (STD) testing.  Diabetes screening. This is done by checking your blood sugar (glucose) after you have not eaten for a while (fasting). You may have this done every 1-3 years.  Bone density scan. This is done to screen for osteoporosis. You may have this done starting at age 71.  Mammogram. This may be done every 1-2 years. Talk to your health care provider about how often you should have regular mammograms. Talk with your health care provider about your test results, treatment options, and if necessary, the need for more tests. Vaccines  Your health care provider may recommend certain vaccines, such as:  Influenza vaccine. This is recommended every year.  Tetanus, diphtheria, and acellular pertussis (Tdap, Td) vaccine. You may need a Td booster every 10 years.  Zoster vaccine. You may need this after age 48.  Pneumococcal 13-valent  conjugate (PCV13) vaccine. One dose is recommended after age 64.  Pneumococcal polysaccharide (PPSV23) vaccine. One dose is recommended after age 50. Talk to your health care provider about which screenings and  vaccines you need and how often you need them. This information is not intended to replace advice given to you by your health care provider. Make sure you discuss any questions you have with your health care provider. Document Released: 09/11/2015 Document Revised: 05/04/2016 Document Reviewed: 06/16/2015 Elsevier Interactive Patient Education  2017 [place] Prevention in the Home Falls can cause injuries. They can happen to people of all ages. There are many things you can do to make your home safe and to help prevent falls. What can I do on the outside of my home?  Regularly fix the edges of walkways and driveways and fix any cracks.  Remove anything that might make you trip as you walk through a door, such as a raised step or threshold.  Trim any bushes or trees on the path to your home.  Use bright outdoor lighting.  Clear any walking paths of anything that might make someone trip, such as rocks or tools.  Regularly check to see if handrails are loose or broken. Make sure that both sides of any steps have handrails.  Any raised decks and porches should have guardrails on the edges.  Have any leaves, snow, or ice cleared regularly.  Use sand or salt on walking paths during winter.  Clean up any spills in your garage right away. This includes oil or grease spills. What can I do in the bathroom?  Use night lights.  Install grab bars by the toilet and in the tub and shower. Do not use towel bars as grab bars.  Use non-skid mats or decals in the tub or shower.  If you need to sit down in the shower, use a plastic, non-slip stool.  Keep the floor dry. Clean up any water that spills on the floor as soon as it happens.  Remove soap buildup in the tub or shower regularly.  Attach bath mats securely with double-sided non-slip rug tape.  Do not have throw rugs and other things on the floor that can make you trip. What can I do in the bedroom?  Use night  lights.  Make sure that you have a light by your bed that is easy to reach.  Do not use any sheets or blankets that are too big for your bed. They should not hang down onto the floor.  Have a firm chair that has side arms. You can use this for support while you get dressed.  Do not have throw rugs and other things on the floor that can make you trip. What can I do in the kitchen?  Clean up any spills right away.  Avoid walking on wet floors.  Keep items that you use a lot in easy-to-reach places.  If you need to reach something above you, use a strong step stool that has a grab bar.  Keep electrical cords out of the way.  Do not use floor polish or wax that makes floors slippery. If you must use wax, use non-skid floor wax.  Do not have throw rugs and other things on the floor that can make you trip. What can I do with my stairs?  Do not leave any items on the stairs.  Make sure that there are handrails on both sides of the stairs and use them. Fix handrails  that are broken or loose. Make sure that handrails are as long as the stairways.  Check any carpeting to make sure that it is firmly attached to the stairs. Fix any carpet that is loose or worn.  Avoid having throw rugs at the top or bottom of the stairs. If you do have throw rugs, attach them to the floor with carpet tape.  Make sure that you have a light switch at the top of the stairs and the bottom of the stairs. If you do not have them, ask someone to add them for you. What else can I do to help prevent falls?  Wear shoes that:  Do not have high heels.  Have rubber bottoms.  Are comfortable and fit you well.  Are closed at the toe. Do not wear sandals.  If you use a stepladder:  Make sure that it is fully opened. Do not climb a closed stepladder.  Make sure that both sides of the stepladder are locked into place.  Ask someone to hold it for you, if possible.  Clearly mark and make sure that you can  see:  Any grab bars or handrails.  First and last steps.  Where the edge of each step is.  Use tools that help you move around (mobility aids) if they are needed. These include:  Canes.  Walkers.  Scooters.  Crutches.  Turn on the lights when you go into a dark area. Replace any light bulbs as soon as they burn out.  Set up your furniture so you have a clear path. Avoid moving your furniture around.  If any of your floors are uneven, fix them.  If there are any pets around you, be aware of where they are.  Review your medicines with your doctor. Some medicines can make you feel dizzy. This can increase your chance of falling. Ask your doctor what other things that you can do to help prevent falls. This information is not intended to replace advice given to you by your health care provider. Make sure you discuss any questions you have with your health care provider. Document Released: 06/11/2009 Document Revised: 01/21/2016 Document Reviewed: 09/19/2014 Elsevier Interactive Patient Education  2017 [employer].

## 2020-07-21 NOTE — Progress Notes (Addendum)
Subjective:   Tanya Rogers is a 69 y.o. female who presents for Medicare Annual (Subsequent) preventive examination.  Participates of call include: Nurse, provider, patient  Method of visit: Telephone  Location of Patient: Home Location of Provider: Office Consent was obtain for visit over the telephone. Services rendered by provider: Visit was performed via telephone I verified that I am speaking with the correct person using two identifiers.  Review of Systems Cardiac Risk Factors include: advanced age (>15men, >56 women)     Objective:    Today's Vitals   07/21/20 1332 07/21/20 1342  BP: 118/72   Weight: 203 lb (92.1 kg)   Height: 5\' 5"  (1.651 m)   PainSc: 0-No pain 0-No pain   Body mass index is 33.78 kg/m.  Advanced Directives 07/21/2020 06/26/2019 07/09/2018  Does Patient Have a Medical Advance Directive? No No No  Would patient like information on creating a medical advance directive? No - Patient declined Yes (MAU/Ambulatory/Procedural Areas - Information given) No - Patient declined    Current Medications (verified) Outpatient Encounter Medications as of 07/21/2020  Medication Sig  . Ascorbic Acid (VITAMIN C) 1000 MG tablet Take 1,000 mg by mouth daily.  . betamethasone dipropionate 0.05 % cream Apply topically 2 (two) times daily. Apply sparingly twice daily to affected areas  . Calcium Carb-Cholecalciferol (CALCIUM 1000 + D PO) Take 1 tablet by mouth daily.   . dorzolamide-timolol (COSOPT) 22.3-6.8 MG/ML ophthalmic solution 1 drop 2 (two) times daily.  07/23/2020 575 MG/5ML SYRP Take 5 mLs by mouth daily. Apple Cider Vinegar Raw Honey Propolis  . furosemide (LASIX) 40 MG tablet TAKE 40 MG DAILY AS NEEDED FOR SWELLING - NEW DOSE  . hydrOXYzine (ATARAX/VISTARIL) 10 MG tablet Take 1 tablet (10 mg total) by mouth 3 (three) times daily as needed.  . Multiple Vitamin (MULTIVITAMIN) capsule Take 1 capsule by mouth daily.    . Omega-3 Fatty Acids (FISH  OIL) 1000 MG CAPS Take 1 capsule by mouth daily.  . potassium chloride SA (KLOR-CON M20) 20 MEQ tablet TAKE 1 TABLET BY MOUTH 3 TIMES WEEKLY AS NEEDED.TAKE ONLY ON THE DAYS THAT YOU TAKE FUROSEMIDE   No facility-administered encounter medications on file as of 07/21/2020.    Allergies (verified) Patient has no known allergies.   History: Past Medical History:  Diagnosis Date  . Cataract    Phreesia 02/09/2020  . Glaucoma 2003 approx  . Glaucoma    Phreesia 02/09/2020  . Hypertension    Phreesia 02/09/2020  . Obesity    Past Surgical History:  Procedure Laterality Date  . COLONOSCOPY N/A 04/15/2008  . COLONOSCOPY N/A 06/26/2019   Procedure: COLONOSCOPY;  Surgeon: 06/28/2019, MD;  Location: [facility];  Service: Endoscopy;  Laterality: N/A;  830  . EYE SURGERY Bilateral 12/2012 , 02/2013   [place], cataract  . TONSILLECTOMY    . TUBAL LIGATION     Family History  Problem Relation Age of Onset  . Heart disease Mother   . Hypertension Mother   . Stroke Mother   . Diabetes Mother   . Diabetes Father   . Hypertension Father   . Diabetes Brother   . Kidney disease Brother   . Diabetes Brother   . Diabetes Brother   . Cancer Brother        lung    Social History   Socioeconomic History  . Marital status: Married    Spouse name: 03/2013   . Number of children:  3  . Years of education: 12+  . Highest education level: 12th grade  Occupational History  . 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 Smoking status: Never Smoker  . Smokeless tobacco: Never Used  Vaping Use  . Vaping Use: Never used  Substance and Sexual Activity  . Alcohol use: Never  . Drug use: No  . Sexual activity: Not Currently  Other Topics Concern  . Not on file  Social History Narrative  . Not on file   Social Determinants of Health   Financial Resource Strain: Low Risk   . Difficulty of Paying Living Expenses: Not hard at all  Food Insecurity: No Food Insecurity  .  Worried About Programme researcher, broadcasting/film/video in the Last Year: Never true  . Ran Out of Food in the Last Year: Never true  Transportation Needs: No Transportation Needs  . Lack of Transportation (Medical): No  . Lack of Transportation (Non-Medical): No  Physical Activity: Insufficiently Active  . Days of Exercise per Week: 2 days  . Minutes of Exercise per Session: 20 min  Stress: No Stress Concern Present  . Feeling of Stress : Not at all  Social Connections: Moderately Integrated  . Frequency of Communication with Friends and Family: More than three times a week  . Frequency of Social Gatherings with Friends and Family: More than three times a week  . Attends Religious Services: Never  . Active Member of Clubs or Organizations: Yes  . Attends Banker Meetings: More than 4 times per year  . Marital Status: Married    Tobacco Counseling Counseling given: Yes   Clinical Intake:  Pre-visit preparation completed: Yes  Pain : No/denies pain Pain Score: 0-No pain     BMI - recorded: 33.78 Nutritional Status: BMI > 30  Obese Nutritional Risks: None Diabetes: No  How often do you need to have someone help you when you read instructions, pamphlets, or other written materials from your doctor or pharmacy?: 1 - Never What is the last grade level you completed in school?: 12  Diabetic? no  Interpreter Needed?: No  Information entered by :: Jerilynn Mages, LPN   Activities of Daily Living In your present state of health, do you have any difficulty performing the following activities: 07/21/2020  Hearing? N  Vision? N  Difficulty concentrating or making decisions? N  Walking or climbing stairs? N  Dressing or bathing? N  Doing errands, shopping? N  Preparing Food and eating ? N  Using the Toilet? N  In the past six months, have you accidently leaked urine? N  Do you have problems with loss of bowel control? N  Managing your Medications? N  Managing your Finances? N   Housekeeping or managing your Housekeeping? N  Some recent data might be hidden    Patient Care Team: Kerri Perches, MD as PCP - General  Indicate any recent Medical Services you may have received from other than [facility] providers in the past year (date may be approximate).     Assessment:   This is a routine wellness examination for Cecia.  Hearing/Vision screen No exam data present  Dietary issues and exercise activities discussed: Current Exercise Habits: Home exercise routine, Type of exercise: walking, Time (Minutes): 20, Frequency (Times/Week): 3, Weekly Exercise (Minutes/Week): 60, Intensity: Mild  Goals    . DIET - EAT MORE FRUITS AND VEGETABLES    . Increase physical activity      Depression Screen [facility] 2/9  Scores 07/21/2020 07/21/2020 04/07/2020 11/19/2019 07/18/2019 04/08/2019 02/11/2019  PHQ - 2 Score 0 0 0 0 0 0 0    Fall Risk Fall Risk  07/21/2020 04/07/2020 02/18/2020 11/19/2019 07/18/2019  Falls in the past year? 0 0 0 0 0  Number falls in past yr: 0 - 0 0 0  Injury with Fall? 0 - 0 0 0  Risk for fall due to : No Fall Risks - - - -  Follow up Falls evaluation completed - - Falls evaluation completed -    Any stairs in or around the home? Yes  If so, are there any without handrails? No  Home free of loose throw rugs in walkways, pet beds, electrical cords, etc? Yes  Adequate lighting in your home to reduce risk of falls? Yes   ASSISTIVE DEVICES UTILIZED TO PREVENT FALLS:  Life alert? No  Use of a cane, walker or w/c? No  Grab bars in the bathroom? Yes  Shower chair or bench in shower? No  Elevated toilet seat or a handicapped toilet? No   TIMED UP AND GO:  Was the test performed? No .  Length of time to ambulate n/a   Cognitive Function:     6CIT Screen 07/21/2020 07/18/2019 07/09/2018  What Year? 0 points 0 points 0 points  What month? 0 points 0 points 0 points  What time? 0 points 0 points 0 points  Count back from 20 0 points 0 points 0  points  Months in reverse 0 points 0 points 0 points  Repeat phrase 0 points 0 points 0 points  Total Score 0 0 0    Immunizations Immunization History  Administered Date(s) Administered  . Pneumococcal Conjugate-13 09/06/2016  . Pneumococcal Polysaccharide-23 12/20/2017  . Tdap 08/17/2011  . Zoster 08/17/2011    TDAP status: Up to date Flu Vaccine status: Declined, Education has been provided regarding the importance of this vaccine but patient still declined. Advised may receive this vaccine at local pharmacy or Health Dept. Aware to provide a copy of the vaccination record if obtained from local pharmacy or Health Dept. Verbalized acceptance and understanding. Pneumococcal vaccine status: Up to date Covid-19 vaccine status: Declined, Education has been provided regarding the importance of this vaccine but patient still declined. Advised may receive this vaccine at local pharmacy or Health Dept.or vaccine clinic. Aware to provide a copy of the vaccination record if obtained from local pharmacy or Health Dept. Verbalized acceptance and understanding.  Qualifies for Shingles Vaccine? Yes   Zostavax completed Yes   Shingrix Completed?: No.    Education has been provided regarding the importance of this vaccine. Patient has been advised to call insurance company to determine out of pocket expense if they have not yet received this vaccine. Advised may also receive vaccine at local pharmacy or Health Dept. Verbalized acceptance and understanding.  Screening Tests Health Maintenance  Topic Date Due  . COVID-19 Vaccine (1) Never done  . INFLUENZA VACCINE  Never done  . TETANUS/TDAP  08/16/2021  . MAMMOGRAM  02/06/2022  . COLONOSCOPY  06/25/2029  . DEXA SCAN  Completed  . Hepatitis C Screening  Completed  . PNA vac Low Risk Adult  Completed    Health Maintenance  Health Maintenance Due  Topic Date Due  . COVID-19 Vaccine (1) Never done  . INFLUENZA VACCINE  Never done     Colorectal cancer screening: Completed 06/26/19. Repeat every 10 years Mammogram status: Completed 02/07/20. Repeat every year Bone Density status: Completed  02/25/20. Results reflect: Bone density results: NORMAL. Repeat every 5 years.  Lung Cancer Screening: (Low Dose CT Chest recommended if Age 54-80 years, 30 pack-year currently smoking OR have quit w/in 15years.) does not qualify.   Lung Cancer Screening Referral: n/a  Additional Screening:  Hepatitis C Screening: does not qualify  Vision Screening: Recommended annual ophthalmology exams for early detection of glaucoma and other disorders of the eye. Is the patient up to date with their annual eye exam?  Yes  Who is the provider or what is the name of the office in which the patient attends annual eye exams? Dr. Gelene Mink w/ [facility] If pt is not established with a provider, would they like to be referred to a provider to establish care? n/a   Dental Screening: Recommended annual dental exams for proper oral hygiene  Community Resource Referral / Chronic Care Management: CRR required this visit?  No   CCM required this visit?  No      Plan:     1. Encounter for Medicare annual wellness exam   I have personally reviewed and noted the following in the patient's chart:   . Medical and social history . Use of alcohol, tobacco or illicit drugs  . Current medications and supplements . Functional ability and status . Nutritional status . Physical activity . Advanced directives . List of other physicians . Hospitalizations, surgeries, and ER visits in previous 12 months . Vitals . Screenings to include cognitive, depression, and falls . Referrals and appointments  In addition, I have reviewed and discussed with patient certain preventive protocols, quality metrics, and best practice recommendations. A written personalized care plan for preventive services as well as general preventive health recommendations were  provided to patient.     Freddy Finner, NP   07/21/2020   Nurse Notes: AWV conducted in the office by phone with patients consent. Patient was in their car traveling, provider was here in the office. Visit took 30 minutes to complete.  Agree with the above information.

## 2020-08-12 ENCOUNTER — Other Ambulatory Visit (HOSPITAL_COMMUNITY): Payer: Self-pay | Admitting: Family Medicine

## 2020-08-12 DIAGNOSIS — R928 Other abnormal and inconclusive findings on diagnostic imaging of breast: Secondary | ICD-10-CM

## 2020-08-19 ENCOUNTER — Other Ambulatory Visit: Payer: Self-pay

## 2020-08-19 ENCOUNTER — Ambulatory Visit (INDEPENDENT_AMBULATORY_CARE_PROVIDER_SITE_OTHER): Payer: Medicare HMO | Admitting: Family Medicine

## 2020-08-19 ENCOUNTER — Encounter: Payer: Self-pay | Admitting: Family Medicine

## 2020-08-19 VITALS — BP 137/80 | HR 67 | Resp 16 | Ht 65.0 in | Wt 198.0 lb

## 2020-08-19 DIAGNOSIS — I10 Essential (primary) hypertension: Secondary | ICD-10-CM | POA: Diagnosis not present

## 2020-08-19 DIAGNOSIS — Z2821 Immunization not carried out because of patient refusal: Secondary | ICD-10-CM

## 2020-08-19 NOTE — Patient Instructions (Addendum)
F/U in 3 months with MD , re evaluate blood pressure, call if you need me sooner  STOP furosemide and potassium  New is spironolactone HALF tablet once daily  Please bring your blood pressure cuff  Flu vaccine today, cONGRATS!!!  Best for 2022!  It is important that you exercise regularly at least 30 minutes 5 times a week. If you develop chest pain, have severe difficulty breathing, or feel very tired, stop exercising immediately and seek medical attention  Think about what you will eat, plan ahead. Choose " clean, green, fresh or frozen" over canned, processed or packaged foods which are more sugary, salty and fatty. 70 to 75% of food eaten should be vegetables and fruit. Three meals at set times with snacks allowed between meals, but they must be fruit or vegetables. Aim to eat over a 12 hour period , example 7 am to 7 pm, and STOP after  your last meal of the day. Drink water,generally about 64 ounces per day, no other drink is as healthy. Fruit juice is best enjoyed in a healthy way, by EATING the fruit. Thanks for choosing [facility], we consider it a privelige to serve you.

## 2020-08-23 ENCOUNTER — Encounter: Payer: Self-pay | Admitting: Family Medicine

## 2020-08-23 NOTE — Assessment & Plan Note (Signed)
Uncontrolled, non compliant, compliance discussed DASH diet and commitment to daily physical activity for a minimum of 30 minutes discussed and encouraged, as a part of hypertension management. The importance of attaining a healthy weight is also discussed.  BP/Weight 08/19/2020 07/21/2020 04/07/2020 02/18/2020 11/19/2019 07/18/2019 06/26/2019  Systolic BP 137 118 118 118 129 130 104  Diastolic BP 80 72 72 78 77 80 59  Wt. (Lbs) 198 203 203 197.12 198 203 198  BMI 32.95 33.78 33.78 32.8 32.95 34.84 32.95

## 2020-08-23 NOTE — Progress Notes (Signed)
   COLANDRA OHANIAN     MRN: [id number]      DOB: [birth year]   HPI Tanya Rogers is here for follow up and re-evaluation of chronic medical conditions, medication management and review of any available recent lab and radiology data.  Preventive health is updated, specifically  Cancer screening and Immunization.   Questions or concerns regarding consultations or procedures which the PT has had in the interim are  addressed. Not taking blood pressure meds and reports good readings at home   ROS Denies recent fever or chills. Denies sinus pressure, nasal congestion, ear pain or sore throat. Denies chest congestion, productive cough or wheezing. Denies chest pains, palpitations and leg swelling Denies abdominal pain, nausea, vomiting,diarrhea or constipation.   Denies dysuria, frequency, hesitancy or incontinence. Denies joint pain, swelling and limitation in mobility. Denies headaches, seizures, numbness, or tingling. Denies depression, anxiety or insomnia. Denies skin break down or rash.   PE  BP 137/80   Pulse 67   Resp 16   Ht 5\' 5"  (1.651 m)   Wt 198 lb (89.8 kg)   SpO2 98%   BMI 32.95 kg/m   Patient alert and oriented and in no cardiopulmonary distress.  HEENT: No facial asymmetry, EOMI,     Neck supple .  Chest: Clear to auscultation bilaterally.  CVS: S1, S2 no murmurs, no S3.Regular rate.  ABD: Soft non tender.   Ext: No edema  MS: Adequate ROM spine, shoulders, hips and knees.  Skin: Intact, no ulcerations or rash noted.  Psych: Good eye contact, normal affect. Memory intact not anxious or depressed appearing.  CNS: CN 2-12 intact, power,  normal throughout.no focal deficits noted.   Assessment & Plan  Influenza vaccine refused Initially stated she would take it, then changed before leaving  Essential hypertension Uncontrolled, non compliant, compliance discussed DASH diet and commitment to daily physical activity for a minimum of 30 minutes  discussed and encouraged, as a part of hypertension management. The importance of attaining a healthy weight is also discussed.  BP/Weight 08/19/2020 07/21/2020 04/07/2020 02/18/2020 11/19/2019 07/18/2019 06/26/2019  Systolic BP 137 118 118 118 129 130 104  Diastolic BP 80 72 72 78 77 80 59  Wt. (Lbs) 198 203 203 197.12 198 203 198  BMI 32.95 33.78 33.78 32.8 32.95 34.84 32.95       Obesity  Patient re-educated about  the importance of commitment to a  minimum of 150 minutes of exercise per week as able.  The importance of healthy food choices with portion control discussed, as well as eating regularly and within a 12 hour window most days. The need to choose "clean , green" food 50 to 75% of the time is discussed, as well as to make water the primary drink and set a goal of 64 ounces water daily.    Weight /BMI 08/19/2020 07/21/2020 04/07/2020  WEIGHT 198 lb 203 lb 203 lb  HEIGHT 5\' 5"  5\' 5"  5\' 5"   BMI 32.95 kg/m2 33.78 kg/m2 33.78 kg/m2

## 2020-08-23 NOTE — Assessment & Plan Note (Signed)
Initially stated she would take it, then changed before leaving

## 2020-08-23 NOTE — Assessment & Plan Note (Signed)
  Patient re-educated about  the importance of commitment to a  minimum of 150 minutes of exercise per week as able.  The importance of healthy food choices with portion control discussed, as well as eating regularly and within a 12 hour window most days. The need to choose "clean , green" food 50 to 75% of the time is discussed, as well as to make water the primary drink and set a goal of 64 ounces water daily.    Weight /BMI 08/19/2020 07/21/2020 04/07/2020  WEIGHT 198 lb 203 lb 203 lb  HEIGHT 5\' 5"  5\' 5"  5\' 5"   BMI 32.95 kg/m2 33.78 kg/m2 33.78 kg/m2

## 2020-08-25 ENCOUNTER — Ambulatory Visit: Payer: Medicare HMO | Admitting: Family Medicine

## 2020-09-01 ENCOUNTER — Encounter (HOSPITAL_COMMUNITY): Payer: Self-pay

## 2020-09-01 ENCOUNTER — Ambulatory Visit (HOSPITAL_COMMUNITY)
Admission: RE | Admit: 2020-09-01 | Discharge: 2020-09-01 | Disposition: A | Discharge status: Home / Self Care | Payer: Medicare HMO | Source: Ambulatory Visit | Attending: Family Medicine | Admitting: Family Medicine

## 2020-09-01 ENCOUNTER — Other Ambulatory Visit: Payer: Self-pay

## 2020-09-01 DIAGNOSIS — Z87898 Personal history of other specified conditions: Secondary | ICD-10-CM | POA: Diagnosis not present

## 2020-09-01 DIAGNOSIS — R928 Other abnormal and inconclusive findings on diagnostic imaging of breast: Secondary | ICD-10-CM | POA: Diagnosis present

## 2020-11-17 ENCOUNTER — Ambulatory Visit: Payer: Medicare HMO | Admitting: Family Medicine

## 2020-11-19 ENCOUNTER — Ambulatory Visit: Payer: Medicare HMO | Admitting: Family Medicine

## 2020-11-26 ENCOUNTER — Ambulatory Visit (INDEPENDENT_AMBULATORY_CARE_PROVIDER_SITE_OTHER): Payer: Medicare HMO | Admitting: Internal Medicine

## 2020-11-26 ENCOUNTER — Other Ambulatory Visit: Payer: Self-pay

## 2020-11-26 ENCOUNTER — Encounter: Payer: Self-pay | Admitting: Internal Medicine

## 2020-11-26 VITALS — BP 122/79 | HR 88 | Resp 18 | Ht 65.0 in | Wt 200.1 lb

## 2020-11-26 DIAGNOSIS — R7303 Prediabetes: Secondary | ICD-10-CM

## 2020-11-26 DIAGNOSIS — E559 Vitamin D deficiency, unspecified: Secondary | ICD-10-CM | POA: Diagnosis not present

## 2020-11-26 DIAGNOSIS — I10 Essential (primary) hypertension: Secondary | ICD-10-CM

## 2020-11-26 LAB — POCT GLYCOSYLATED HEMOGLOBIN (HGB A1C)
HbA1c, POC (controlled diabetic range): 5.7 % (ref 0.0–7.0)
HbA1c, POC (prediabetic range): 5.7 % (ref 5.7–6.4)

## 2020-11-26 NOTE — Progress Notes (Signed)
Established Patient Office Visit  Subjective:  Patient ID: Tanya Rogers, female    DOB: August 12, 1951  Age: 70 y.o. MRN: [id number]  CC:  Chief Complaint  Patient presents with  . Follow-up    3 months follow up bp check     HPI Tanya Rogers presents for follow up of her chronic medical conditions.  HTN: BP is well-controlled. Takes medications on most of the days, but admits that she skips some doses. Patient denies headache, dizziness, chest pain, dyspnea or palpitations.  Prediabetes: Her HbA1C was 5.7 in the office today. She has been following low carb. diet. She denies any polyuria or polydipsia.    Past Medical History:  Diagnosis Date  . Cataract    Phreesia 02/09/2020  . Glaucoma 2003 approx  . Glaucoma    Phreesia 02/09/2020  . Hypertension    Phreesia 02/09/2020  . Obesity     Past Surgical History:  Procedure Laterality Date  . COLONOSCOPY N/A 04/15/2008  . COLONOSCOPY N/A 06/26/2019   Procedure: COLONOSCOPY;  Surgeon: Rogene Houston, MD;  Location: [facility];  Service: Endoscopy;  Laterality: N/A;  830  . EYE SURGERY Bilateral 12/2012 , 02/2013   [place], cataract  . TONSILLECTOMY    . TUBAL LIGATION      Family History  Problem Relation Age of Onset  . Heart disease Mother   . Hypertension Mother   . Stroke Mother   . Diabetes Mother   . Diabetes Father   . Hypertension Father   . Diabetes Brother   . Kidney disease Brother   . Diabetes Brother   . Diabetes Brother   . Cancer Brother        lung     Social History   Socioeconomic History  . Marital status: Married    Spouse name: Jeneen Rinks   . Number of children: 3  . Years of education: 12+  . Highest education level: 12th grade  Occupational History  . 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 Smoking status: Never Smoker  . Smokeless tobacco: Never Used  Vaping Use  . Vaping Use: Never used  Substance and Sexual Activity  . Alcohol use: Never  . Drug  use: No  . Sexual activity: Not Currently  Other Topics Concern  . Not on file  Social History Narrative  . Not on file   Social Determinants of Health   Financial Resource Strain: Low Risk   . Difficulty of Paying Living Expenses: Not hard at all  Food Insecurity: No Food Insecurity  . Worried About Charity fundraiser in the Last Year: Never true  . Ran Out of Food in the Last Year: Never true  Transportation Needs: No Transportation Needs  . Lack of Transportation (Medical): No  . Lack of Transportation (Non-Medical): No  Physical Activity: Insufficiently Active  . Days of Exercise per Week: 2 days  . Minutes of Exercise per Session: 20 min  Stress: No Stress Concern Present  . Feeling of Stress : Not at all  Social Connections: Moderately Integrated  . Frequency of Communication with Friends and Family: More than three times a week  . Frequency of Social Gatherings with Friends and Family: More than three times a week  . Attends Religious Services: Never  . Active Member of Clubs or Organizations: Yes  . Attends Archivist Meetings: More than 4 times per year  . Marital Status: Married  Human resources officer Violence:  Not At Risk  . Fear of Current or Ex-Partner: No  . Emotionally Abused: No  . Physically Abused: No  . Sexually Abused: No    Outpatient Medications Prior to Visit  Medication Sig Dispense Refill  . Ascorbic Acid (VITAMIN C) 1000 MG tablet Take 1,000 mg by mouth daily.    . betamethasone dipropionate 0.05 % cream Apply topically 2 (two) times daily. Apply sparingly twice daily to affected areas 45 g 1  . Calcium Carb-Cholecalciferol (CALCIUM 1000 + D PO) Take 1 tablet by mouth daily.    . dorzolamide-timolol (COSOPT) 22.3-6.8 MG/ML ophthalmic solution 1 drop 2 (two) times daily.    Kendall Flack 575 MG/5ML SYRP Take 5 mLs by mouth daily. Apple Cider Vinegar Raw Honey Propolis    . hydrOXYzine (ATARAX/VISTARIL) 10 MG tablet Take 1 tablet (10 mg total)  by mouth 3 (three) times daily as needed. 30 tablet 1  . Multiple Vitamin (MULTIVITAMIN) capsule Take 1 capsule by mouth daily.    . Omega-3 Fatty Acids (FISH OIL) 1000 MG CAPS Take 1 capsule by mouth daily.    Marland Kitchen spironolactone (ALDACTONE) 25 MG tablet Take one half tablet by mouth once daily for blood pressure 45 tablet 1   No facility-administered medications prior to visit.    No Known Allergies  ROS Review of Systems  Constitutional: Negative for chills and fever.  HENT: Negative for congestion, sinus pressure, sinus pain and sore throat.   Eyes: Negative for pain and discharge.  Respiratory: Negative for cough and shortness of breath.   Cardiovascular: Negative for chest pain and palpitations.  Gastrointestinal: Negative for abdominal pain, constipation, diarrhea, nausea and vomiting.  Endocrine: Negative for polydipsia and polyuria.  Genitourinary: Negative for dysuria and hematuria.  Musculoskeletal: Negative for neck pain and neck stiffness.  Skin: Negative for rash.  Neurological: Negative for dizziness and weakness.  Psychiatric/Behavioral: Negative for agitation and behavioral problems.      Objective:    Physical Exam Vitals reviewed.  Constitutional:      General: She is not in acute distress.    Appearance: She is not diaphoretic.  HENT:     Head: Normocephalic and atraumatic.     Nose: Nose normal. No congestion.     Mouth/Throat:     Mouth: Mucous membranes are moist.     Pharynx: No posterior oropharyngeal erythema.  Eyes:     General: No scleral icterus.    Extraocular Movements: Extraocular movements intact.  Cardiovascular:     Rate and Rhythm: Normal rate and regular rhythm.     Pulses: Normal pulses.     Heart sounds: Normal heart sounds. No murmur heard.   Pulmonary:     Breath sounds: Normal breath sounds. No wheezing or rales.  Musculoskeletal:     Cervical back: Neck supple. No tenderness.     Right lower leg: No edema.     Left lower  leg: No edema.  Skin:    General: Skin is warm.     Findings: No rash.  Neurological:     General: No focal deficit present.     Mental Status: She is alert and oriented to person, place, and time.  Psychiatric:        Mood and Affect: Mood normal.        Behavior: Behavior normal.     BP 122/79 (BP Location: Left Arm, Patient Position: Sitting)   Pulse 88   Resp 18   Ht _0  (1.651 m)  Wt 200 lb 1.9 oz (90.8 kg)   SpO2 98%   BMI 33.30 kg/m  Wt Readings from Last 3 Encounters:  11/26/20 200 lb 1.9 oz (90.8 kg)  08/19/20 198 lb (89.8 kg)  07/21/20 203 lb (92.1 kg)     There are no preventive care reminders to display for this patient.  There are no preventive care reminders to display for this patient.  Lab Results  Component Value Date   TSH 1.45 02/20/2020   Lab Results  Component Value Date   WBC 5.1 02/20/2020   HGB 13.0 02/20/2020   HCT 39.7 02/20/2020   MCV 93.0 02/20/2020   PLT 176 02/20/2020   Lab Results  Component Value Date   NA 139 02/20/2020   K 4.3 02/20/2020   CO2 27 02/20/2020   GLUCOSE 88 02/20/2020   BUN 13 02/20/2020   CREATININE 0.84 02/20/2020   BILITOT 0.5 02/20/2020   ALKPHOS 62 12/20/2016   AST 23 02/20/2020   ALT 25 02/20/2020   PROT 6.8 02/20/2020   ALBUMIN 3.8 12/20/2016   CALCIUM 9.2 02/20/2020   Lab Results  Component Value Date   CHOL 124 02/20/2020   Lab Results  Component Value Date   HDL 53 02/20/2020   Lab Results  Component Value Date   LDLCALC 58 02/20/2020   Lab Results  Component Value Date   TRIG 53 02/20/2020   Lab Results  Component Value Date   CHOLHDL 2.3 02/20/2020   Lab Results  Component Value Date   HGBA1C 5.7 11/26/2020   HGBA1C 5.7 11/26/2020      Assessment & Plan:   Problem List Items Addressed This Visit      Cardiovascular and Mediastinum   Essential hypertension - Primary    BP Readings from Last 1 Encounters:  11/26/20 122/79   Well-controlled with Spironolactone  12.5 mg QD Counseled for compliance with the medications Advised DASH diet and moderate exercise/walking, at least 150 mins/week       Relevant Orders   CBC with Differential   CMP14+EGFR   Lipid panel   TSH     Other   Prediabetes Lab Results  Component Value Date   HGBA1C 5.7 11/26/2020   HGBA1C 5.7 11/26/2020   Continue to follow low carbohydrate diet   Relevant Orders   CMP14+EGFR   POCT glycosylated hemoglobin (Hb A1C) (Completed)   Vitamin D deficiency   Relevant Orders   Vitamin D (25 hydroxy)      No orders of the defined types were placed in this encounter.   Follow-up: Return in about 4 months (around 03/28/2021) for HTN.    Lindell Spar, MD

## 2020-11-26 NOTE — Assessment & Plan Note (Signed)
BP Readings from Last 1 Encounters:  11/26/20 122/79   Well-controlled with Spironolactone 12.5 mg QD Counseled for compliance with the medications Advised DASH diet and moderate exercise/walking, at least 150 mins/week

## 2020-11-26 NOTE — Patient Instructions (Addendum)
Please continue to take Spironolactone as prescribed.  Continue to follow DASH diet and perform moderate exercise/walking at least 150 mins/week.  Please get fasting blood tests done before the next visit.

## 2021-02-01 ENCOUNTER — Other Ambulatory Visit: Payer: Self-pay

## 2021-02-01 ENCOUNTER — Encounter: Payer: Self-pay | Admitting: Family Medicine

## 2021-02-01 DIAGNOSIS — R928 Other abnormal and inconclusive findings on diagnostic imaging of breast: Secondary | ICD-10-CM

## 2021-03-05 ENCOUNTER — Other Ambulatory Visit: Payer: Self-pay | Admitting: Family Medicine

## 2021-03-27 LAB — LIPID PANEL
Chol/HDL Ratio: 2.5 ratio (ref 0.0–4.4)
Cholesterol, Total: 118 mg/dL (ref 100–199)
HDL: 48 mg/dL (ref >39)
LDL Chol Calc (NIH): 58 mg/dL (ref 0–99)
Triglycerides: 52 mg/dL (ref 0–149)
VLDL Cholesterol Cal: 12 mg/dL (ref 5–40)

## 2021-03-27 LAB — CBC WITH DIFFERENTIAL/PLATELET
Basophils Absolute: 0 10*3/uL (ref 0.0–0.2)
Basos: 1 %
EOS (ABSOLUTE): 0.1 10*3/uL (ref 0.0–0.4)
Eos: 2 %
Hematocrit: 38.3 % (ref 34.0–46.6)
Hemoglobin: 12.8 g/dL (ref 11.1–15.9)
Immature Grans (Abs): 0 10*3/uL (ref 0.0–0.1)
Immature Granulocytes: 0 %
Lymphocytes Absolute: 1.9 10*3/uL (ref 0.7–3.1)
Lymphs: 33 %
MCH: 30.3 pg (ref 26.6–33.0)
MCHC: 33.4 g/dL (ref 31.5–35.7)
MCV: 91 fL (ref 79–97)
Monocytes Absolute: 0.5 10*3/uL (ref 0.1–0.9)
Monocytes: 10 %
Neutrophils Absolute: 3 10*3/uL (ref 1.4–7.0)
Neutrophils: 54 %
Platelets: 162 10*3/uL (ref 150–450)
RBC: 4.22 x10E6/uL (ref 3.77–5.28)
RDW: 13.1 % (ref 11.7–15.4)
WBC: 5.6 10*3/uL (ref 3.4–10.8)

## 2021-03-27 LAB — CMP14+EGFR
ALT: 19 IU/L (ref 0–32)
AST: 21 IU/L (ref 0–40)
Albumin/Globulin Ratio: 1.4 (ref 1.2–2.2)
Albumin: 4 g/dL (ref 3.8–4.8)
Alkaline Phosphatase: 89 IU/L (ref 44–121)
BUN/Creatinine Ratio: 17 (ref 12–28)
BUN: 17 mg/dL (ref 8–27)
Bilirubin Total: 0.4 mg/dL (ref 0.0–1.2)
CO2: 22 mmol/L (ref 20–29)
Calcium: 9.2 mg/dL (ref 8.7–10.3)
Chloride: 103 mmol/L (ref 96–106)
Creatinine, Ser: 0.99 mg/dL (ref 0.57–1.00)
Globulin, Total: 2.8 g/dL (ref 1.5–4.5)
Glucose: 91 mg/dL (ref 65–99)
Potassium: 4.7 mmol/L (ref 3.5–5.2)
Sodium: 139 mmol/L (ref 134–144)
Total Protein: 6.8 g/dL (ref 6.0–8.5)
eGFR: 62 mL/min/{1.73_m2} (ref >59)

## 2021-03-27 LAB — VITAMIN D 25 HYDROXY (VIT D DEFICIENCY, FRACTURES): Vit D, 25-Hydroxy: 46.2 ng/mL (ref 30.0–100.0)

## 2021-03-27 LAB — TSH: TSH: 1.04 u[IU]/mL (ref 0.450–4.500)

## 2021-03-30 ENCOUNTER — Other Ambulatory Visit: Payer: Self-pay

## 2021-03-30 ENCOUNTER — Encounter: Payer: Self-pay | Admitting: Family Medicine

## 2021-03-30 ENCOUNTER — Other Ambulatory Visit (HOSPITAL_COMMUNITY): Payer: Self-pay | Admitting: Family Medicine

## 2021-03-30 ENCOUNTER — Ambulatory Visit (INDEPENDENT_AMBULATORY_CARE_PROVIDER_SITE_OTHER): Payer: Medicare HMO | Admitting: Family Medicine

## 2021-03-30 VITALS — BP 114/68 | HR 67 | Temp 98.8°F | Resp 20 | Ht 65.0 in | Wt 188.0 lb

## 2021-03-30 DIAGNOSIS — E559 Vitamin D deficiency, unspecified: Secondary | ICD-10-CM | POA: Diagnosis not present

## 2021-03-30 DIAGNOSIS — E6609 Other obesity due to excess calories: Secondary | ICD-10-CM

## 2021-03-30 DIAGNOSIS — R928 Other abnormal and inconclusive findings on diagnostic imaging of breast: Secondary | ICD-10-CM

## 2021-03-30 DIAGNOSIS — R7303 Prediabetes: Secondary | ICD-10-CM

## 2021-03-30 DIAGNOSIS — I10 Essential (primary) hypertension: Secondary | ICD-10-CM | POA: Diagnosis not present

## 2021-03-30 DIAGNOSIS — Z6833 Body mass index (BMI) 33.0-33.9, adult: Secondary | ICD-10-CM

## 2021-03-30 NOTE — Patient Instructions (Addendum)
Annual exam in office with MD mid December, call if you need me sooner  EXCELLENT labs, blood pressure and change in health habits for your better, keep it up!!!  Please get the 2 shingrix vaccines at your pharmacy  It is important that you exercise regularly at least 30 minutes 5 times a week. If you develop chest pain, have severe difficulty breathing, or feel very tired, stop exercising immediately and seek medical attention   Think about what you will eat, plan ahead. Choose " clean, green, fresh or frozen" over canned, processed or packaged foods which are more sugary, salty and fatty. 70 to 75% of food eaten should be vegetables and fruit. Three meals at set times with snacks allowed between meals, but they must be fruit or vegetables. Aim to eat over a 12 hour period , example 7 am to 7 pm, and STOP after  your last meal of the day. Drink water,generally about 64 ounces per day, no other drink is as healthy. Fruit juice is best enjoyed in a healthy way, by EATING the fruit. Thanks for choosing [facility], we consider it a privelige to serve you.

## 2021-03-30 NOTE — Progress Notes (Signed)
Tanya Rogers     MRN: [id number]      DOB: [birth year]   HPI Tanya Rogers is here for follow up and re-evaluation of chronic medical conditions, medication management and review of any available recent lab and radiology data.  Preventive health is updated, specifically  Cancer screening and Immunization.   Questions or concerns regarding consultations or procedures which the PT has had in the interim are  addressed. .  There are no new concerns.  There are no specific complaints   ROS Denies recent fever or chills. Denies sinus pressure, nasal congestion, ear pain or sore throat. Denies chest congestion, productive cough or wheezing. Denies chest pains, palpitations and leg swelling Denies abdominal pain, nausea, vomiting,diarrhea or constipation.   Denies dysuria, frequency, hesitancy or incontinence. Denies joint pain, swelling and limitation in mobility. Denies headaches, seizures, numbness, or tingling. Denies depression, anxiety or insomnia. Denies skin break down or rash.   PE  BP 114/68 (BP Location: Right Arm, Patient Position: Sitting, Cuff Size: Large)   Pulse 67   Temp 98.8 F (37.1 C)   Resp 20   Ht 5\' 5"  (1.651 m)   Wt 188 lb (85.3 kg)   SpO2 98%   BMI 31.28 kg/m   Patient alert and oriented and in no cardiopulmonary distress.  HEENT: No facial asymmetry, EOMI,     Neck supple .  Chest: Clear to auscultation bilaterally.  CVS: S1, S2 no murmurs, no S3.Regular rate.  ABD: Soft non tender.   Ext: No edema  MS: Adequate ROM spine, shoulders, hips and knees.  Skin: Intact, no ulcerations or rash noted.  Psych: Good eye contact, normal affect. Memory intact not anxious or depressed appearing.  CNS: CN 2-12 intact, power,  normal throughout.no focal deficits noted.   Assessment & Plan  Essential hypertension Normotensive off medication, excellent lifestyle change with desired result DASH diet and commitment to daily physical activity for a  minimum of 30 minutes discussed and encouraged, as a part of hypertension management. The importance of attaining a healthy weight is also discussed.  BP/Weight 03/30/2021 11/26/2020 08/19/2020 07/21/2020 04/07/2020 02/18/2020 11/19/2019  Systolic BP 114 122 137 118 118 118 129  Diastolic BP 68 79 80 72 72 78 77  Wt. (Lbs) 188 200.12 198 203 203 197.12 198  BMI 31.28 33.3 32.95 33.78 33.78 32.8 32.95       Obesity Improved  Patient re-educated about  the importance of commitment to a  minimum of 150 minutes of exercise per week as able.  The importance of healthy food choices with portion control discussed, as well as eating regularly and within a 12 hour window most days. The need to choose "clean , green" food 50 to 75% of the time is discussed, as well as to make water the primary drink and set a goal of 64 ounces water daily.    Weight /BMI 03/30/2021 11/26/2020 08/19/2020  WEIGHT 188 lb 200 lb 1.9 oz 198 lb  HEIGHT 5\' 5"  5\' 5"  5\' 5"   BMI 31.28 kg/m2 33.3 kg/m2 32.95 kg/m2      Prediabetes Patient educated about the importance of limiting  Carbohydrate intake , the need to commit to daily physical activity for a minimum of 30 minutes , and to commit weight loss. The fact that changes in all these areas will reduce or eliminate all together the development of diabetes is stressed.  Corrected with lifestyle change  Diabetic Labs Latest Ref Rng & Units 03/26/2021 11/26/2020  11/26/2020 02/20/2020 04/04/2019  HbA1c 0.0 - 7.0 % - 5.7 5.7 - -  Chol 100 - 199 mg/dL 144 - - 315 -  HDL >40 mg/dL 48 - - 53 -  Calc LDL 0 - 99 mg/dL 58 - - 58 -  Triglycerides 0 - 149 mg/dL 52 - - 53 -  Creatinine 0.57 - 1.00 mg/dL 0.86 - - 7.61 9.50   BP/Weight 03/30/2021 11/26/2020 08/19/2020 07/21/2020 04/07/2020 02/18/2020 11/19/2019  Systolic BP 114 122 137 118 118 118 129  Diastolic BP 68 79 80 72 72 78 77  Wt. (Lbs) 188 200.12 198 203 203 197.12 198  BMI 31.28 33.3 32.95 33.78 33.78 32.8 32.95   No flowsheet  data found.    Vitamin D deficiency corrected  Glaucoma Managed by opthalmology

## 2021-04-04 NOTE — Assessment & Plan Note (Signed)
Managed by opthalmology 

## 2021-04-04 NOTE — Assessment & Plan Note (Signed)
Patient educated about the importance of limiting  Carbohydrate intake , the need to commit to daily physical activity for a minimum of 30 minutes , and to commit weight loss. The fact that changes in all these areas will reduce or eliminate all together the development of diabetes is stressed.  Corrected with lifestyle change  Diabetic Labs Latest Ref Rng & Units 03/26/2021 11/26/2020 11/26/2020 02/20/2020 04/04/2019  HbA1c 0.0 - 7.0 % - 5.7 5.7 - -  Chol 100 - 199 mg/dL 502 - - 774 -  HDL >12 mg/dL 48 - - 53 -  Calc LDL 0 - 99 mg/dL 58 - - 58 -  Triglycerides 0 - 149 mg/dL 52 - - 53 -  Creatinine 0.57 - 1.00 mg/dL 8.78 - - 6.76 7.20   BP/Weight 03/30/2021 11/26/2020 08/19/2020 07/21/2020 04/07/2020 02/18/2020 11/19/2019  Systolic BP 114 122 137 118 118 118 129  Diastolic BP 68 79 80 72 72 78 77  Wt. (Lbs) 188 200.12 198 203 203 197.12 198  BMI 31.28 33.3 32.95 33.78 33.78 32.8 32.95   No flowsheet data found.

## 2021-04-04 NOTE — Assessment & Plan Note (Signed)
corrected

## 2021-04-04 NOTE — Assessment & Plan Note (Signed)
Normotensive off medication, excellent lifestyle change with desired result DASH diet and commitment to daily physical activity for a minimum of 30 minutes discussed and encouraged, as a part of hypertension management. The importance of attaining a healthy weight is also discussed.  BP/Weight 03/30/2021 11/26/2020 08/19/2020 07/21/2020 04/07/2020 02/18/2020 11/19/2019  Systolic BP 114 122 137 118 118 118 129  Diastolic BP 68 79 80 72 72 78 77  Wt. (Lbs) 188 200.12 198 203 203 197.12 198  BMI 31.28 33.3 32.95 33.78 33.78 32.8 32.95

## 2021-04-04 NOTE — Assessment & Plan Note (Signed)
Improved  Patient re-educated about  the importance of commitment to a  minimum of 150 minutes of exercise per week as able.  The importance of healthy food choices with portion control discussed, as well as eating regularly and within a 12 hour window most days. The need to choose "clean , green" food 50 to 75% of the time is discussed, as well as to make water the primary drink and set a goal of 64 ounces water daily.    Weight /BMI 03/30/2021 11/26/2020 08/19/2020  WEIGHT 188 lb 200 lb 1.9 oz 198 lb  HEIGHT 5\' 5"  5\' 5"  5\' 5"   BMI 31.28 kg/m2 33.3 kg/m2 32.95 kg/m2

## 2021-04-27 ENCOUNTER — Ambulatory Visit (HOSPITAL_COMMUNITY)
Admission: RE | Admit: 2021-04-27 | Discharge: 2021-04-27 | Disposition: A | Discharge status: Home / Self Care | Payer: Medicare HMO | Source: Ambulatory Visit | Attending: Family Medicine | Admitting: Family Medicine

## 2021-04-27 ENCOUNTER — Other Ambulatory Visit: Payer: Self-pay

## 2021-04-27 DIAGNOSIS — R928 Other abnormal and inconclusive findings on diagnostic imaging of breast: Secondary | ICD-10-CM | POA: Insufficient documentation

## 2021-06-16 ENCOUNTER — Other Ambulatory Visit: Payer: Self-pay

## 2021-06-16 ENCOUNTER — Encounter: Payer: Self-pay | Admitting: Family Medicine

## 2021-06-16 ENCOUNTER — Telehealth: Payer: Self-pay

## 2021-06-16 ENCOUNTER — Ambulatory Visit (INDEPENDENT_AMBULATORY_CARE_PROVIDER_SITE_OTHER): Payer: Medicare HMO | Admitting: Family Medicine

## 2021-06-16 DIAGNOSIS — R059 Cough, unspecified: Secondary | ICD-10-CM | POA: Insufficient documentation

## 2021-06-16 DIAGNOSIS — R051 Acute cough: Secondary | ICD-10-CM | POA: Diagnosis not present

## 2021-06-16 MED ORDER — BENZONATATE 100 MG PO CAPS
100.0000 mg | ORAL_CAPSULE | Freq: Two times a day (BID) | ORAL | 0 refills | Status: DC | PRN
Start: 2021-06-16 — End: 2021-08-06

## 2021-06-16 NOTE — Progress Notes (Signed)
Virtual Visit via Telephone Note  I connected with Tanya Rogers on 06/16/21 at  1:40 PM EDT by telephone and verified that I am speaking with the correct person using two identifiers.  Location:home Patient: home Provider: office   I discussed the limitations, risks, security and privacy concerns of performing an evaluation and management service by telephone and the availability of in person appointments. I also discussed with the patient that there may be a patient responsible charge related to this service. The patient expressed understanding and agreed to proceed.   History of Present Illness: 3 day h/o dry cough, fever x 2 days, improved, no sinus pressure , ear pain hoarse, no  throat pain. Brother coughing and sneezing,  High of 102.1, then 99 yesterday, single episode of profuse sweating Negative covid est x 2 , no known sick contact    O.vs Good communication with no confusion and intact memory. Alert and oriented x 3 No signs of respiratory distress during speech    Assessment and Plan: Cough Symptomatic treatment, tessalon perles, fluid,vit C and rest, call if persiss o worsens   Follow Up Instructions:    I discussed the assessment and treatment plan with the patient. The patient was provided an opportunity to ask questions and all were answered. The patient agreed with the plan and demonstrated an understanding of the instructions.   The patient was advised to call back or seek an in-person evaluation if the symptoms worsen or if the condition fails to improve as anticipated.  I provided 6 minutes of non-face-to-face time during this encounter.   Syliva Overman, MD

## 2021-06-16 NOTE — Patient Instructions (Addendum)
F/u as before, call if you need me sooner  Tessalon perles are prescribed for your cough  Drink a lot of fluids, take extra vit C as in eating oranges, and get sufficient rest Call if symptoms worsen, as In fever, chills, body aches, shortness of breath, green sputum, headache  Thanks for choosing [facility], we consider it a privelige to serve you.

## 2021-06-16 NOTE — Telephone Encounter (Signed)
error 

## 2021-06-20 NOTE — Assessment & Plan Note (Addendum)
Symptomatic treatment, tessalon perles, fluid,vit C and rest, call if persiss o worsens

## 2021-06-24 ENCOUNTER — Ambulatory Visit: Payer: Medicare HMO | Admitting: Family Medicine

## 2021-08-06 ENCOUNTER — Encounter: Payer: Self-pay | Admitting: Nurse Practitioner

## 2021-08-06 ENCOUNTER — Other Ambulatory Visit: Payer: Self-pay

## 2021-08-06 ENCOUNTER — Ambulatory Visit (INDEPENDENT_AMBULATORY_CARE_PROVIDER_SITE_OTHER): Payer: Medicare HMO | Admitting: Nurse Practitioner

## 2021-08-06 VITALS — BP 149/68 | HR 60 | Resp 17 | Ht 65.0 in | Wt 180.0 lb

## 2021-08-06 DIAGNOSIS — Z0000 Encounter for general adult medical examination without abnormal findings: Secondary | ICD-10-CM

## 2021-08-06 DIAGNOSIS — L03011 Cellulitis of right finger: Secondary | ICD-10-CM | POA: Insufficient documentation

## 2021-08-06 DIAGNOSIS — Z0001 Encounter for general adult medical examination with abnormal findings: Secondary | ICD-10-CM | POA: Diagnosis not present

## 2021-08-06 DIAGNOSIS — E6609 Other obesity due to excess calories: Secondary | ICD-10-CM | POA: Diagnosis not present

## 2021-08-06 DIAGNOSIS — Z6833 Body mass index (BMI) 33.0-33.9, adult: Secondary | ICD-10-CM

## 2021-08-06 DIAGNOSIS — M7989 Other specified soft tissue disorders: Secondary | ICD-10-CM | POA: Diagnosis not present

## 2021-08-06 MED ORDER — CEPHALEXIN 500 MG PO CAPS: 500.0000 mg | ORAL_CAPSULE | Freq: Two times a day (BID) | ORAL | 0 refills | Status: DC

## 2021-08-06 NOTE — Patient Instructions (Addendum)
Please take keflex 500mg  two times daily for 7 days for the infection on your finger.   It is important that you exercise regularly at least 30 minutes 5 times a week.  Think about what you will eat, plan ahead. Choose " clean, green, fresh or frozen" over canned, processed or packaged foods which are more sugary, salty and fatty. 70 to 75% of food eaten should be vegetables and fruit. Three meals at set times with snacks allowed between meals, but they must be fruit or vegetables. Aim to eat over a 12 hour period , example 7 am to 7 pm, and STOP after  your last meal of the day. Drink water,generally about 64 ounces per day, no other drink is as healthy. Fruit juice is best enjoyed in a healthy way, by EATING the fruit.  Thanks for choosing [facility] [employer], we consider it a privelige to serve you.

## 2021-08-06 NOTE — Assessment & Plan Note (Signed)
Lower extremities does not appear swollen. Pt advised to wear compression socks and keep legs elevated when sitting.

## 2021-08-06 NOTE — Assessment & Plan Note (Signed)
  Wt Readings from Last 3 Encounters:  08/06/21 180 lb (81.6 kg)  03/30/21 188 lb (85.3 kg)  11/26/20 200 lb 1.9 oz (90.8 kg)  She has a threadmill at home. Her goal is 150lb. She has been walking 4-5 miles a day.

## 2021-08-06 NOTE — Assessment & Plan Note (Signed)
Keflex 500mg  BID for seven days.  Has scheduled an appointment  To see othopedics.

## 2021-08-06 NOTE — Assessment & Plan Note (Signed)
Annual exam as documented.  ?Counseling done include healthy lifestyle involving committing to 150 minutes of exercise per week, heart healthy diet, and attaining healthy weight. The importance of adequate sleep also discussed.  ?Regular use of seat belt and home safety were also discussed . ?Changes in health habits are decided on by patient with goals and time frames set for achieving them. ?Immunization and cancer screening  needs are specifically addressed at this visit.   ?

## 2021-08-06 NOTE — Progress Notes (Signed)
Established Patient Office Visit  Subjective:  Patient ID: Tanya Rogers, female    DOB: 11-18-1950  Age: 70 y.o. MRN: [id number]  CC:  Chief Complaint  Patient presents with   Annual Exam   Hand Pain    Right pinky finger looks to be infected pt states it is sore to the touch.    HPI Tanya Rogers presents for for annual exam. Pt c/o right pinky finger swelling and pain, she has made an appointment to see otho. She has has the problems for several week s. She has used antibacterial soap.   Pt c/o swelling to both legs , for the last couple of months, denies pain.   Past Medical History:  Diagnosis Date   Cataract    Phreesia 02/09/2020   Glaucoma 2003 approx   Glaucoma    Phreesia 02/09/2020   Hypertension    Phreesia 02/09/2020   Obesity     Past Surgical History:  Procedure Laterality Date   COLONOSCOPY N/A 04/15/2008   COLONOSCOPY N/A 06/26/2019   Procedure: COLONOSCOPY;  Surgeon: Rogene Houston, MD;  Location: [facility];  Service: Endoscopy;  Laterality: N/A;  830   EYE SURGERY Bilateral 12/2012 , 02/2013   [place], cataract   TONSILLECTOMY     TUBAL LIGATION      Family History  Problem Relation Age of Onset   Heart disease Mother    Hypertension Mother    Stroke Mother    Diabetes Mother    Diabetes Father    Hypertension Father    Diabetes Brother    Kidney disease Brother    Diabetes Brother    Diabetes Brother    Cancer Brother        lung     Social History   Socioeconomic History   Marital status: Married    Spouse name: Jeneen Rinks    Number of children: 3   Years of education: 12+   Highest education level: 12th grade  Occupational History   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    Social Determinants of Health   Financial Resource Strain: Not on file  Food Insecurity: Not on file  Transportation Needs: Not on file  Physical Activity: Not on file  Stress: Not on file  Social Connections: Not on file  Intimate Partner Violence: Not on file    Outpatient Medications Prior to Visit  Medication Sig Dispense Refill   Ascorbic Acid (VITAMIN C) 1000 MG tablet Take 1,000 mg by mouth daily.     Calcium Carb-Cholecalciferol (CALCIUM 1000 + D PO) Take 1 tablet by mouth daily.     dorzolamide-timolol (COSOPT) 22.3-6.8 MG/ML ophthalmic solution 1 drop 2 (two) times daily.     Elderberry 575 MG/5ML SYRP Take 5 mLs by mouth daily. Apple Cider Vinegar Raw Honey Propolis     Multiple Vitamin (MULTIVITAMIN) capsule Take 1 capsule by mouth daily.     Omega-3 Fatty Acids (FISH OIL) 1000 MG CAPS Take 1 capsule by mouth daily.     zinc gluconate 50 MG tablet Take 50 mg by mouth daily.  benzonatate (TESSALON) 100 MG capsule Take 1 capsule (100 mg total) by mouth 2 (two) times daily as needed for cough. 20 capsule 0   No facility-administered medications prior to visit.    No Known Allergies  ROS Review of Systems  Constitutional: Negative.   HENT: Negative.    Eyes: Negative.   Respiratory: Negative.    Cardiovascular:  Positive for leg swelling. Negative for palpitations.  Gastrointestinal: Negative.   Endocrine: Negative.   Genitourinary: Negative.   Musculoskeletal:  Positive for arthralgias. Negative for back pain, gait problem, joint swelling, myalgias, neck pain and neck stiffness.       Right pinky pain and swelling.  Skin: Negative.   Allergic/Immunologic: Negative.  Negative for environmental allergies, food allergies and immunocompromised state.  Neurological: Negative.   Hematological: Negative.   Psychiatric/Behavioral: Negative.       Objective:    Physical  Exam Constitutional:      General: She is not in acute distress.    Appearance: Normal appearance. She is not ill-appearing, toxic-appearing or diaphoretic.  HENT:     Head: Normocephalic and atraumatic.     Right Ear: External ear normal.     Left Ear: External ear normal.     Nose: Nose normal. No congestion or rhinorrhea.     Mouth/Throat:     Mouth: Mucous membranes are moist.     Pharynx: Oropharynx is clear. No oropharyngeal exudate or posterior oropharyngeal erythema.  Eyes:     General:        Right eye: No discharge.        Left eye: No discharge.     Extraocular Movements: Extraocular movements intact.     Conjunctiva/sclera: Conjunctivae normal.  Neck:     Vascular: No carotid bruit.  Cardiovascular:     Rate and Rhythm: Normal rate and regular rhythm.     Pulses: Normal pulses.     Heart sounds: Normal heart sounds. No murmur heard.   No friction rub. No gallop.  Pulmonary:     Effort: Pulmonary effort is normal. No respiratory distress.     Breath sounds: Normal breath sounds. No stridor. No wheezing, rhonchi or rales.  Chest:     Chest wall: No tenderness.  Abdominal:     General: There is no distension.     Palpations: Abdomen is soft. There is no mass.     Tenderness: There is no abdominal tenderness. There is no right CVA tenderness, left CVA tenderness, guarding or rebound.     Hernia: No hernia is present.  Musculoskeletal:     Cervical back: Normal range of motion and neck supple. No rigidity or tenderness.  Lymphadenopathy:     Cervical: No cervical adenopathy.  Skin:    General: Skin is warm and dry.     Capillary Refill: Capillary refill takes less than 2 seconds.     Coloration: Skin is not jaundiced or pale.     Findings: No bruising or erythema.     Comments: Right pinky finger swollen and tender below the nail, skin color around site looks dark   Neurological:     General: No focal deficit present.     Mental Status: She is alert and oriented  to person, place, and time.     Cranial Nerves: No cranial nerve deficit.     Sensory: No sensory deficit.     Motor: No weakness.     Coordination: Coordination normal.     Gait: Gait normal.  Psychiatric:        Mood and Affect: Mood normal.        Behavior: Behavior normal.        Thought Content: Thought content normal.        Judgment: Judgment normal.    BP (!) 149/68   Pulse 60   Resp 17   Ht _0  (1.651 m)   Wt 180 lb (81.6 kg)   SpO2 98%   BMI 29.95 kg/m  Wt Readings from Last 3 Encounters:  08/06/21 180 lb (81.6 kg)  03/30/21 188 lb (85.3 kg)  11/26/20 200 lb 1.9 oz (90.8 kg)     Health Maintenance Due  Topic Date Due   Zoster Vaccines- Shingrix (1 of 2) Never done    There are no preventive care reminders to display for this patient.  Lab Results  Component Value Date   TSH 1.040 03/26/2021   Lab Results  Component Value Date   WBC 5.6 03/26/2021   HGB 12.8 03/26/2021   HCT 38.3 03/26/2021   MCV 91 03/26/2021   PLT 162 03/26/2021   Lab Results  Component Value Date   NA 139 03/26/2021   K 4.7 03/26/2021   CO2 22 03/26/2021   GLUCOSE 91 03/26/2021   BUN 17 03/26/2021   CREATININE 0.99 03/26/2021   BILITOT 0.4 03/26/2021   ALKPHOS 89 03/26/2021   AST 21 03/26/2021   ALT 19 03/26/2021   PROT 6.8 03/26/2021   ALBUMIN 4.0 03/26/2021   CALCIUM 9.2 03/26/2021   EGFR 62 03/26/2021   Lab Results  Component Value Date   CHOL 118 03/26/2021   Lab Results  Component Value Date   HDL 48 03/26/2021   Lab Results  Component Value Date   LDLCALC 58 03/26/2021   Lab Results  Component Value Date   TRIG 52 03/26/2021   Lab Results  Component Value Date   CHOLHDL 2.5 03/26/2021   Lab Results  Component Value Date   HGBA1C 5.7 11/26/2020   HGBA1C 5.7 11/26/2020      Assessment & Plan:   Problem List Items Addressed This Visit   None   No orders of the defined types were placed in this encounter.   Follow-up: No follow-ups on  file.    Renee Rival, FNP

## 2021-08-10 ENCOUNTER — Encounter: Payer: Medicare HMO | Admitting: Family Medicine

## 2021-08-11 ENCOUNTER — Encounter: Payer: Self-pay | Admitting: Physician Assistant

## 2021-08-11 ENCOUNTER — Ambulatory Visit: Payer: Medicare HMO | Admitting: Physician Assistant

## 2021-08-11 DIAGNOSIS — M65332 Trigger finger, left middle finger: Secondary | ICD-10-CM

## 2021-08-11 DIAGNOSIS — L03011 Cellulitis of right finger: Secondary | ICD-10-CM | POA: Diagnosis not present

## 2021-08-11 MED ORDER — DOXYCYCLINE HYCLATE 100 MG PO TABS
100.0000 mg | ORAL_TABLET | Freq: Two times a day (BID) | ORAL | 0 refills | Status: AC
Start: 2021-08-11 — End: 2021-08-25

## 2021-08-11 NOTE — Progress Notes (Signed)
Office Visit Note   Patient: Tanya Rogers           Date of Birth: 15-May-1951           MRN: [id number] Visit Date: 08/11/2021              Requested by: Tanya Rogers [address], [address] [place],  [place] [address] PCP: Tanya Rogers   Assessment & Plan: Visit Diagnoses:  1. Trigger finger, left middle finger   2. Paronychia of finger, right     Plan: We will stop her Keflex place her on doxycycline for 14 days twice daily.  She will begin soaking the right fifth finger and also observes at least for 15 to 20 minutes daily.  See back in 2 weeks to see how the right fifth finger is progressing.  She will follow-up sooner if she develops any drainage, or significant change for the worse.  In regards to the left middle finger she does not wish to have any treatment today.  She states we will discuss this further whenever she follows up in 2 weeks.  Questions were encouraged and answered.  Did discuss with her repeat injection versus left long finger trigger finger release.  Follow-Up Instructions: Return in about 2 weeks (around 08/25/2021).   Orders:  No orders of the defined types were placed in this encounter.  Meds ordered this encounter  Medications   doxycycline (VIBRA-TABS) 100 MG tablet    Sig: Take 1 tablet (100 mg total) by mouth 2 (two) times daily for 14 days.    Dispense:  28 tablet    Refill:  0      Procedures: No procedures performed   Clinical Data: No additional findings.   Subjective: Chief Complaint  Patient presents with   Left Middle Finger - Pain   Right Little Finger - Nail Problem, Pain    HPI Tanya Rogers 70 year old female who comes in today with left long finger there is locking on her she reports that she has had the symptoms ongoing for the past year.  She has had 2 injections.  She states Finger locks several times a week.  She also has right fifth finger paronychia which she has been on Keflex for less  than a week at this point.  She does state that the fifth finger is not as sore and is not as puffy.  She denies any fevers chills.  She is prediabetic according to her records but states that she was not informed that she is diabetic or prediabetic.  Denies any injury to the right fifth finger.  Denies any drainage from the right third finger. Review of Systems See HPI.  Objective: Vital Signs: There were no vitals taken for this visit.  Physical Exam General: Well-developed well-nourished female no acute distress. Psych: Alert and oriented x3  Ortho Exam Left fifth finger no active triggering.  Tenderness over the A1 pulley of the fifth finger.  Manger the fingers are without triggering.  Sensation grossly intact throughout left hand.  Right fifth finger area with swelling but no abnormal warmth consistent with a resolving paronychia.  No expressible purulence.  No rashes skin lesions ulcerations throughout the right hand otherwise. Specialty Comments:  No specialty comments available.  Imaging: No results found.   PMFS History: Patient Active Problem List   Diagnosis Date Noted   Paronychia of finger, right 08/06/2021   Swelling of lower extremity 08/06/2021   Cough  06/16/2021   Trigger finger, left middle finger 11/19/2019   Annual physical exam 11/08/2018   Bradycardia 12/22/2017   Vitamin D deficiency 09/11/2016   Influenza vaccine refused 09/11/2016   Prediabetes 08/17/2011   Obesity 04/02/2009   GLAUCOMA 11/19/2007   Glaucoma 11/19/2007   Past Medical History:  Diagnosis Date   Cataract    Phreesia 02/09/2020   Glaucoma 2003 approx   Glaucoma    Phreesia 02/09/2020   Hypertension    Phreesia 02/09/2020   Obesity     Family History  Problem Relation Age of Onset   Heart disease Mother    Hypertension Mother    Stroke Mother    Diabetes Mother    Diabetes Father    Hypertension Father    Diabetes Brother    Kidney disease Brother    Diabetes Brother     Diabetes Brother    Cancer Brother        lung     Past Surgical History:  Procedure Laterality Date   COLONOSCOPY N/A 04/15/2008   COLONOSCOPY N/A 06/26/2019   Procedure: COLONOSCOPY;  Surgeon: Malissa Hippo, Rogers;  Location: [facility];  Service: Endoscopy;  Laterality: N/A;  830   EYE SURGERY Bilateral 12/2012 , 02/2013   [place], cataract   TONSILLECTOMY     TUBAL LIGATION     Social History   Occupational History   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

## 2021-09-01 ENCOUNTER — Ambulatory Visit: Payer: Medicare HMO | Admitting: Physician Assistant

## 2021-09-16 ENCOUNTER — Ambulatory Visit: Payer: Medicare HMO | Admitting: Physician Assistant

## 2021-10-12 ENCOUNTER — Ambulatory Visit (INDEPENDENT_AMBULATORY_CARE_PROVIDER_SITE_OTHER): Payer: Medicare HMO | Admitting: Nurse Practitioner

## 2021-10-12 ENCOUNTER — Other Ambulatory Visit: Payer: Self-pay

## 2021-10-12 ENCOUNTER — Encounter: Payer: Self-pay | Admitting: Nurse Practitioner

## 2021-10-12 VITALS — BP 120/78 | HR 58 | Ht 65.0 in | Wt 180.0 lb

## 2021-10-12 DIAGNOSIS — M79644 Pain in right finger(s): Secondary | ICD-10-CM | POA: Diagnosis not present

## 2021-10-12 MED ORDER — MUPIROCIN CALCIUM 2 % EX CREA: 1.0000 "application " | TOPICAL_CREAM | Freq: Two times a day (BID) | CUTANEOUS | 0 refills | Status: AC

## 2021-10-12 NOTE — Progress Notes (Signed)
° °  Tanya Rogers     MRN: [id number]      DOB: Aug 04, 1951   HPI Tanya Rogers is here for complaints of sore pinky finger on th right hand for about a month. She was treated for paronychia 2 months ago  with doxycycline for 14 days. The finger got better but the soreness is still there, when she touches the finger the pain is a 5/10, no drainage. Denies fever, chills. Pt was supposed to follow up with otho  but she did not.    ROS Denies recent fever or chills. Denies sinus pressure, nasal congestion, ear pain or sore throat. Denies chest congestion, productive cough or wheezing. Denies chest pains, palpitations and leg swelling Denies abdominal pain, nausea, vomiting,diarrhea or constipation.   Denies dysuria, frequency, hesitancy or incontinence. Denies joint pain, swelling and limitation in mobility. Denies headaches, seizures, numbness, or tingling. Denies depression, anxiety or insomnia.    PE  BP 120/78 (BP Location: Right Arm, Patient Position: Sitting, Cuff Size: Normal)    Pulse (!) 58    Ht 5\' 5"  (1.651 m)    Wt 180 lb (81.6 kg)    SpO2 100%    BMI 29.95 kg/m   Patient alert and oriented and in no cardiopulmonary distress..  Chest: Clear to auscultation bilaterally.  CVS: S1, S2 no murmurs, no S3.Regular rate.  ABD: Soft non tender.   Ext: No edema  MS: Adequate ROM spine, shoulders, hips and knees.  Skin: Intact, no ulcerations or rash noted, right fifth finger tender on palpation slightly swollen,  no drainage, redness, voiced tenderness on palpation . Finger nail appears normal   Psych: Good eye contact, normal affect. Memory intact not anxious or depressed appearing.  CNS: CN 2-12 intact, power,  normal throughout.no focal deficits noted.   Assessment & Plan

## 2021-10-12 NOTE — Patient Instructions (Addendum)
Please get your shingles vaccine and TDAP vaccine at your pharmacy.   Please use Bactroban cream twice daily to your right fifth finger two times daily for 5 days , follow up with otho as discussed if your symptoms does not get better.    It is important that you exercise regularly at least 30 minutes 5 times a week.  Think about what you will eat, plan ahead. Choose " clean, green, fresh or frozen" over canned, processed or packaged foods which are more sugary, salty and fatty. 70 to 75% of food eaten should be vegetables and fruit. Three meals at set times with snacks allowed between meals, but they must be fruit or vegetables. Aim to eat over a 12 hour period , example 7 am to 7 pm, and STOP after  your last meal of the day. Drink water,generally about 64 ounces per day, no other drink is as healthy. Fruit juice is best enjoyed in a healthy way, by EATING the fruit.  Thanks for choosing [facility], we consider it a privelige to serve you.

## 2021-10-12 NOTE — Assessment & Plan Note (Signed)
RX Bactroban  ointment use bid for 5 days .  Use Tylenol as needed for pain. Follow-up with Ortho if soreness persist.

## 2021-10-26 ENCOUNTER — Ambulatory Visit: Payer: Medicare HMO

## 2021-10-26 ENCOUNTER — Other Ambulatory Visit: Payer: Self-pay

## 2021-11-02 ENCOUNTER — Ambulatory Visit (INDEPENDENT_AMBULATORY_CARE_PROVIDER_SITE_OTHER): Payer: Medicare HMO | Admitting: Family Medicine

## 2021-11-02 ENCOUNTER — Other Ambulatory Visit: Payer: Self-pay

## 2021-11-02 DIAGNOSIS — Z0000 Encounter for general adult medical examination without abnormal findings: Secondary | ICD-10-CM

## 2021-11-02 NOTE — Progress Notes (Signed)
Subjective:   Tanya Rogers is a 71 y.o. female who presents for Medicare Annual (Subsequent) preventive examination.  Review of Systems    I connected with  Artist Beachevern G Mcculloh on 11/02/21 by a audio enabled telemedicine application and verified that I am speaking with the correct person using two identifiers.  Patient Location: Home  Provider Location: Office/Clinic  I discussed the limitations of evaluation and management by telemedicine. The patient expressed understanding and agreed to proceed.  Cardiac Risk Factors include: advanced age (>6455men, 14>65 women)     Objective:    Today's Vitals   11/02/21 0921  PainSc: 0-No pain   There is no height or weight on file to calculate BMI.  Advanced Directives 07/21/2020 06/26/2019 07/09/2018  Does Patient Have a Medical Advance Directive? No No No  Would patient like information on creating a medical advance directive? No - Patient declined Yes (MAU/Ambulatory/Procedural Areas - Information given) No - Patient declined    Current Medications (verified) Outpatient Encounter Medications as of 11/02/2021  Medication Sig   Ascorbic Acid (VITAMIN C) 1000 MG tablet Take 1,000 mg by mouth daily.   Calcium Carb-Cholecalciferol (CALCIUM 1000 + D PO) Take 1 tablet by mouth daily.   dorzolamide-timolol (COSOPT) 22.3-6.8 MG/ML ophthalmic solution 1 drop 2 (two) times daily.   Elderberry 575 MG/5ML SYRP Take 5 mLs by mouth daily. Apple Cider Vinegar Raw Honey Propolis   Multiple Vitamin (MULTIVITAMIN) capsule Take 1 capsule by mouth daily.   Omega-3 Fatty Acids (FISH OIL) 1000 MG CAPS Take 1 capsule by mouth daily.   trimethoprim-polymyxin b (POLYTRIM) ophthalmic solution SMARTSIG:In Eye(s)   zinc gluconate 50 MG tablet Take 50 mg by mouth daily.   No facility-administered encounter medications on file as of 11/02/2021.    Allergies (verified) Patient has no known allergies.   History: Past Medical History:  Diagnosis Date    Cataract    Phreesia 02/09/2020   Glaucoma 2003 approx   Glaucoma    Phreesia 02/09/2020   Hypertension    Phreesia 02/09/2020   Obesity    Past Surgical History:  Procedure Laterality Date   COLONOSCOPY N/A 04/15/2008   COLONOSCOPY N/A 06/26/2019   Procedure: COLONOSCOPY;  Surgeon: Malissa Hippoehman, Najeeb U, MD;  Location: [facility];  Service: Endoscopy;  Laterality: N/A;  830   EYE SURGERY Bilateral 12/2012 , 02/2013   [place], cataract   TONSILLECTOMY     TUBAL LIGATION     Family History  Problem Relation Age of Onset   Heart disease Mother    Hypertension Mother    Stroke Mother    Diabetes Mother    Diabetes Father    Hypertension Father    Diabetes Brother    Kidney disease Brother    Diabetes Brother    Diabetes Brother    Cancer Brother        lung    Social History   Socioeconomic History   Marital status: Married    Spouse name: Fayrene FearingJames    Number of children: 3   Years of education: 12+   Highest education level: 12th grade  Occupational History   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    Social Determinants of Health   Financial Resource Strain: Low Risk    Difficulty of Paying Living Expenses: Not very hard  Food Insecurity: No Food Insecurity   Worried About Programme researcher, broadcasting/film/video in the Last Year: Never true   Ran Out of Food in the Last Year: Never true  Transportation Needs: No Transportation Needs   Lack of Transportation (Medical): No   Lack of Transportation (Non-Medical): No  Physical Activity: Sufficiently Active   Days of Exercise per Week: 4 days   Minutes of Exercise per Session: 60 min  Stress: No Stress Concern Present    Feeling of Stress : Not at all  Social Connections: Socially Integrated   Frequency of Communication with Friends and Family: More than three times a week   Frequency of Social Gatherings with Friends and Family: More than three times a week   Attends Religious Services: More than 4 times per year   Active Member of [employer] or Organizations: Yes   Attends Engineer, structural: More than 4 times per year   Marital Status: Married    Tobacco Counseling Counseling given: Not Answered   Clinical Intake:  Pre-visit preparation completed: No  Pain : No/denies pain Pain Score: 0-No pain     Nutritional Status: BMI 25 -29 Overweight Nutritional Risks: None Diabetes: No CBG done?: No Did pt. bring in CBG monitor from home?: No  How often do you need to have someone help you when you read instructions, pamphlets, or other written materials from your doctor or pharmacy?: 1 - Never What is the last grade level you completed in school?: 12th  Diabetic?no  Interpreter Needed?: No      Activities of Daily Living In your present state of health, do you have any difficulty performing the following activities: 11/02/2021  Hearing? N  Vision? N  Difficulty concentrating or making decisions? N  Walking or climbing stairs? N  Dressing or bathing? N  Doing errands, shopping? N  Preparing Food and eating ? N  Using the Toilet? N  In the past six months, have you accidently leaked urine? N  Do you have problems with loss of bowel control? N  Managing your Medications? N  Managing your Finances? N  Housekeeping or managing your Housekeeping? N  Some recent data might be hidden    Patient Care Team: Kerri Perches, MD as PCP - General  Indicate any recent Medical Services you may have received from other than [facility] providers in the past year (date may be approximate).     Assessment:   This is a routine wellness examination for Morine.  Hearing/Vision screen No  results found.  Dietary issues and exercise activities discussed: Current Exercise Habits: Home exercise routine, Type of exercise: walking, Time (Minutes): 60, Frequency (Times/Week): 3, Weekly Exercise (Minutes/Week): 180, Exercise limited by: None identified   Goals Addressed             This Visit's Progress    DIET - EAT MORE FRUITS AND VEGETABLES   On track    Increase physical activity   On track      Depression Screen PHQ 2/9 Scores 11/02/2021 11/02/2021 10/12/2021 08/06/2021 06/16/2021 03/30/2021 11/26/2020  PHQ - 2 Score 0 0 0 0 0 0 0  PHQ- 9 Score - - - - 0 - -    Fall Risk Fall Risk  11/02/2021 10/12/2021 08/06/2021  06/16/2021 03/30/2021  Falls in the past year? 0 0 0 0 0  Number falls in past yr: 0 0 0 - 0  Injury with Fall? 0 0 0 - 0  Risk for fall due to : - No Fall Risks - - No Fall Risks  Follow up Falls prevention discussed Falls evaluation completed - - Falls evaluation completed    FALL RISK PREVENTION PERTAINING TO THE HOME:  Any stairs in or around the home? Yes  If so, are there any without handrails? No  Home free of loose throw rugs in walkways, pet beds, electrical cords, etc? Yes  Adequate lighting in your home to reduce risk of falls? Yes   ASSISTIVE DEVICES UTILIZED TO PREVENT FALLS:  Life alert? No  Use of a cane, walker or w/c? No  Grab bars in the bathroom? No  Shower chair or bench in shower? No  Elevated toilet seat or a handicapped toilet? Yes     Cognitive Function:     6CIT Screen 11/02/2021 07/21/2020 07/18/2019 07/09/2018  What Year? 0 points 0 points 0 points 0 points  What month? 0 points 0 points 0 points 0 points  What time? 0 points 0 points 0 points 0 points  Count back from 20 0 points 0 points 0 points 0 points  Months in reverse - 0 points 0 points 0 points  Repeat phrase - 0 points 0 points 0 points  Total Score - 0 0 0    Immunizations Immunization History  Administered Date(s) Administered   Pneumococcal Conjugate-13  09/06/2016   Pneumococcal Polysaccharide-23 12/20/2017   Tdap 08/17/2011   Zoster, Live 08/17/2011    TDAP status: Due, Education has been provided regarding the importance of this vaccine. Advised may receive this vaccine at local pharmacy or Health Dept. Aware to provide a copy of the vaccination record if obtained from local pharmacy or Health Dept. Verbalized acceptance and understanding.  Flu Vaccine status: Up to date  Pneumococcal vaccine status: Up to date  Covid-19 vaccine status: Completed vaccines  Qualifies for Shingles Vaccine? Yes   Zostavax completed No   Shingrix Completed?: No.    Education has been provided regarding the importance of this vaccine. Patient has been advised to call insurance company to determine out of pocket expense if they have not yet received this vaccine. Advised may also receive vaccine at local pharmacy or Health Dept. Verbalized acceptance and understanding.  Screening Tests Health Maintenance  Topic Date Due   Zoster Vaccines- Shingrix (1 of 2) Never done   TETANUS/TDAP  08/16/2021   INFLUENZA VACCINE  11/26/2021 (Originally 03/29/2021)   MAMMOGRAM  04/28/2023   COLONOSCOPY (Pts 45-37yrs Insurance coverage will need to be confirmed)  06/25/2029   Pneumonia Vaccine 110+ Years old  Completed   DEXA SCAN  Completed   Hepatitis C Screening  Completed   HPV VACCINES  Aged Out   COVID-19 Vaccine  Discontinued    Health Maintenance  Health Maintenance Due  Topic Date Due   Zoster Vaccines- Shingrix (1 of 2) Never done   TETANUS/TDAP  08/16/2021    Colorectal cancer screening: Type of screening: Colonoscopy. Completed 06/26/2019. Repeat every 10 years  Mammogram status: Completed 04/27/2021. Repeat every year  Bone Density status: Completed 02/25/2020. Results reflect: Bone density results: NORMAL. Repeat every 0 years.  Lung Cancer Screening: (Low Dose CT Chest recommended if Age 87-80 years, 30 pack-year currently smoking OR have quit  w/in 15years.) does not qualify.   Lung  Cancer Screening Referral: n/a  Additional Screening:  Hepatitis C Screening: does qualify; Completed 09/06/2016  Vision Screening: Recommended annual ophthalmology exams for early detection of glaucoma and other disorders of the eye. Is the patient up to date with their annual eye exam?   apt 11/2018 Who is the provider or what is the name of the office in which the patient attends annual eye exams? [facility]; Dr. Monica Becton If pt is not established with a provider, would they like to be referred to a provider to establish care?  N/a .   Dental Screening: Recommended annual dental exams for proper oral hygiene     Plan:     I have personally reviewed and noted the following in the patients chart:   Medical and social history Use of alcohol, tobacco or illicit drugs  Current medications and supplements including opioid prescriptions.  Functional ability and status Nutritional status Physical activity Advanced directives List of other physicians Hospitalizations, surgeries, and ER visits in previous 12 months Vitals Screenings to include cognitive, depression, and falls Referrals and appointments  In addition, I have reviewed and discussed with patient certain preventive protocols, quality metrics, and best practice recommendations. A written personalized care plan for preventive services as well as general preventive health recommendations were provided to patient.     Cyril Loosen, RN   11/02/2021   Nurse Notes:   Ms. Durie , Thank you for taking time to come for your Medicare Wellness Visit. I appreciate your ongoing commitment to your health goals. Please review the following plan we discussed and let me know if I can assist you in the future.   These are the goals we discussed:  Goals      DIET - EAT MORE FRUITS AND VEGETABLES     Increase physical activity        This is a list of the screening  recommended for you and due dates:  Health Maintenance  Topic Date Due   Zoster (Shingles) Vaccine (1 of 2) Never done   Tetanus Vaccine  08/16/2021   Flu Shot  11/26/2021*   Mammogram  04/28/2023   Colon Cancer Screening  06/25/2029   Pneumonia Vaccine  Completed   DEXA scan (bone density measurement)  Completed   Hepatitis C Screening: USPSTF Recommendation to screen - Ages 37-79 yo.  Completed   HPV Vaccine  Aged Out   COVID-19 Vaccine  Discontinued  *Topic was postponed. The date shown is not the original due date.

## 2021-11-02 NOTE — Patient Instructions (Signed)
?  Tanya Rogers , ?Thank you for taking time to come for your Medicare Wellness Visit. I appreciate your ongoing commitment to your health goals. Please review the following plan we discussed and let me know if I can assist you in the future.  ? ?These are the goals we discussed: ? Goals   ? ?  DIET - EAT MORE FRUITS AND VEGETABLES   ?  Increase physical activity   ? ?  ?  ?This is a list of the screening recommended for you and due dates:  ?Health Maintenance  ?Topic Date Due  ? Zoster (Shingles) Vaccine (1 of 2) Never done  ? Tetanus Vaccine  08/16/2021  ? Flu Shot  11/26/2021*  ? Mammogram  04/28/2023  ? Colon Cancer Screening  06/25/2029  ? Pneumonia Vaccine  Completed  ? DEXA scan (bone density measurement)  Completed  ? Hepatitis C Screening: USPSTF Recommendation to screen - Ages 21-79 yo.  Completed  ? HPV Vaccine  Aged Out  ? COVID-19 Vaccine  Discontinued  ?*Topic was postponed. The date shown is not the original due date.  ?  ?

## 2022-02-04 ENCOUNTER — Ambulatory Visit: Payer: Medicare HMO | Admitting: Family Medicine

## 2022-02-08 ENCOUNTER — Other Ambulatory Visit: Payer: Self-pay

## 2022-02-08 ENCOUNTER — Encounter: Payer: Self-pay | Admitting: Family Medicine

## 2022-02-08 ENCOUNTER — Telehealth: Payer: Self-pay

## 2022-02-08 ENCOUNTER — Ambulatory Visit (INDEPENDENT_AMBULATORY_CARE_PROVIDER_SITE_OTHER): Payer: Medicare HMO | Admitting: Family Medicine

## 2022-02-08 VITALS — BP 132/76 | HR 58 | Ht 65.0 in | Wt 182.0 lb

## 2022-02-08 DIAGNOSIS — E6609 Other obesity due to excess calories: Secondary | ICD-10-CM

## 2022-02-08 DIAGNOSIS — L03011 Cellulitis of right finger: Secondary | ICD-10-CM

## 2022-02-08 DIAGNOSIS — Z13220 Encounter for screening for lipoid disorders: Secondary | ICD-10-CM | POA: Diagnosis not present

## 2022-02-08 DIAGNOSIS — Z87898 Personal history of other specified conditions: Secondary | ICD-10-CM

## 2022-02-08 DIAGNOSIS — E559 Vitamin D deficiency, unspecified: Secondary | ICD-10-CM | POA: Diagnosis not present

## 2022-02-08 DIAGNOSIS — H4010X0 Unspecified open-angle glaucoma, stage unspecified: Secondary | ICD-10-CM

## 2022-02-08 DIAGNOSIS — I10 Essential (primary) hypertension: Secondary | ICD-10-CM

## 2022-02-08 DIAGNOSIS — Z6830 Body mass index (BMI) 30.0-30.9, adult: Secondary | ICD-10-CM

## 2022-02-08 NOTE — Patient Instructions (Addendum)
Annual exam December 11 or after, call if you need me  sooner  Please schedule mammogram at checkout  You are referred to Dermatology  Congrats on weight loss and healthy eating habits  It is important that you exercise regularly at least 30 minutes 5 times a week. If you develop chest pain, have severe difficulty breathing, or feel very tired, stop exercising immediately and seek medical attention   Aim for 6 to 8 hrs/night of sleep   Please get fasting labs in August or Septemebr CBc, lipid, cmp and EGFR, tSH and vit D  Thanks for choosing [facility], we consider it a privelige to serve you.

## 2022-02-08 NOTE — Telephone Encounter (Signed)
Called to scheduled patient her mammogram. Per radiology at [facility] will need an order put in for diagnostic before ordering according to her last mammogram in August 2022. Need to call patient once appointment has been scheduled. Request a Tuesday at 1:00 pm at [facility].

## 2022-02-08 NOTE — Assessment & Plan Note (Addendum)
6 month history, abx twice persits, refer dermatology

## 2022-02-08 NOTE — Telephone Encounter (Signed)
Patient aware mammogram scheduled for 05/03/22 @ 2:00

## 2022-02-16 NOTE — Assessment & Plan Note (Signed)
Updated lab needed at/ before next visit.   

## 2022-02-16 NOTE — Progress Notes (Signed)
   Tanya Rogers     MRN: [id number]      DOB: 1951/01/22   HPI Tanya Rogers is here for follow up and re-evaluation of chronic medical conditions, medication management and review of any available recent lab and radiology data.  Preventive health is updated, specifically  Cancer screening and Immunization.   Questions or concerns regarding consultations or procedures which the PT has had in the interim are  addressed. The PT denies any adverse reactions to current medications since the last visit.  Persistent pain and swelling at base of right 5th finger  ROS Denies recent fever or chills. Denies sinus pressure, nasal congestion, ear pain or sore throat. Denies chest congestion, productive cough or wheezing. Denies chest pains, palpitations and leg swelling Denies abdominal pain, nausea, vomiting,diarrhea or constipation.   Denies dysuria, frequency, hesitancy or incontinence. Denies joint pain, swelling and limitation in mobility. Denies headaches, seizures, numbness, or tingling. Denies depression, anxiety or insomnia.  PE  BP 132/76   Pulse (!) 58   Ht 5\' 5"  (1.651 m)   Wt 182 lb 0.6 oz (82.6 kg)   SpO2 97%   BMI 30.29 kg/m   Patient alert and oriented and in no cardiopulmonary distress.  HEENT: No facial asymmetry, EOMI,     Neck supple .  Chest: Clear to auscultation bilaterally.  CVS: S1, S2 no murmurs, no S3.Regular rate.  ABD: Soft non tender.   Ext: No edema  MS: Adequate ROM spine, shoulders, hips and knees.  Skin: Intact, no ulcerations or rash noted.  Psych: Good eye contact, normal affect. Memory intact not anxious or depressed appearing.  CNS: CN 2-12 intact, power,  normal throughout.no focal deficits noted.   Assessment & Plan  Paronychia of right little finger 6 month history, abx twice persits, refer dermatology  Obesity  Patient re-educated about  the importance of commitment to a  minimum of 150 minutes of exercise per week as  able.  The importance of healthy food choices with portion control discussed, as well as eating regularly and within a 12 hour window most days. The need to choose "clean , green" food 50 to 75% of the time is discussed, as well as to make water the primary drink and set a goal of 64 ounces water daily.       02/08/2022    1:09 PM 10/12/2021    2:59 PM 08/06/2021   10:10 AM  Weight /BMI  Weight 182 lb 0.6 oz 180 lb 180 lb  Height 5\' 5"  (1.651 m) 5\' 5"  (1.651 m) 5\' 5"  (1.651 m)  BMI 30.29 kg/m2 29.95 kg/m2 29.95 kg/m2      Vitamin D deficiency Updated lab needed at/ before next visit.   Glaucoma Controled , follows regularly with Opthalmology

## 2022-02-16 NOTE — Assessment & Plan Note (Signed)
  Patient re-educated about  the importance of commitment to a  minimum of 150 minutes of exercise per week as able.  The importance of healthy food choices with portion control discussed, as well as eating regularly and within a 12 hour window most days. The need to choose "clean , green" food 50 to 75% of the time is discussed, as well as to make water the primary drink and set a goal of 64 ounces water daily.       02/08/2022    1:09 PM 10/12/2021    2:59 PM 08/06/2021   10:10 AM  Weight /BMI  Weight 182 lb 0.6 oz 180 lb 180 lb  Height 5\' 5"  (1.651 m) 5\' 5"  (1.651 m) 5\' 5"  (1.651 m)  BMI 30.29 kg/m2 29.95 kg/m2 29.95 kg/m2

## 2022-02-16 NOTE — Assessment & Plan Note (Signed)
Controled , follows regularly with Opthalmology

## 2022-05-03 ENCOUNTER — Ambulatory Visit (HOSPITAL_COMMUNITY)
Admission: RE | Admit: 2022-05-03 | Discharge: 2022-05-03 | Disposition: A | Discharge status: Home / Self Care | Payer: Medicare HMO | Source: Ambulatory Visit | Attending: Family Medicine | Admitting: Family Medicine

## 2022-05-03 ENCOUNTER — Encounter (HOSPITAL_COMMUNITY): Payer: Self-pay

## 2022-05-03 DIAGNOSIS — Z1239 Encounter for other screening for malignant neoplasm of breast: Secondary | ICD-10-CM | POA: Diagnosis not present

## 2022-05-03 DIAGNOSIS — Z87898 Personal history of other specified conditions: Secondary | ICD-10-CM | POA: Insufficient documentation

## 2022-05-03 DIAGNOSIS — R922 Inconclusive mammogram: Secondary | ICD-10-CM | POA: Insufficient documentation

## 2022-07-13 ENCOUNTER — Encounter: Payer: Self-pay | Admitting: Family Medicine

## 2022-07-13 ENCOUNTER — Ambulatory Visit (INDEPENDENT_AMBULATORY_CARE_PROVIDER_SITE_OTHER): Payer: Medicare HMO | Admitting: Family Medicine

## 2022-07-13 VITALS — BP 120/76 | HR 67 | Ht 65.0 in | Wt 190.0 lb

## 2022-07-13 DIAGNOSIS — R600 Localized edema: Secondary | ICD-10-CM | POA: Insufficient documentation

## 2022-07-13 DIAGNOSIS — H6121 Impacted cerumen, right ear: Secondary | ICD-10-CM | POA: Insufficient documentation

## 2022-07-13 DIAGNOSIS — R609 Edema, unspecified: Secondary | ICD-10-CM

## 2022-07-13 MED ORDER — HYDROCHLOROTHIAZIDE 12.5 MG PO TABS: 12.5000 mg | ORAL_TABLET | Freq: Every day | ORAL | 0 refills | Status: DC

## 2022-07-13 MED ORDER — HYDROCHLOROTHIAZIDE 12.5 MG PO TABS: 12.5000 mg | ORAL_TABLET | Freq: Every day | ORAL | 0 refills | Status: DC | PRN

## 2022-07-13 NOTE — Assessment & Plan Note (Signed)
Low suspicion for heart failure Will prescribe a short course of hydrochlorothiazide 12.5 mg daily as needed for peripheral edema Encourage conservative management with leg elevation, compression, and  decreasing her sodium intake

## 2022-07-13 NOTE — Progress Notes (Signed)
Acute Office Visit  Subjective:     Patient ID: Tanya Rogers, female    DOB: [birth year], 71 y.o.   MRN: [id number]  Chief Complaint  Patient presents with   Ear Problem    Pt reports feeling a whooshing sound in her right ear.    Leg Swelling    Retaining fluid in her legs, noticed it about a month ago, 06/12/2022.    HPI Patient is in today for complaints of feeling a whooshing sound in her right ear.  She denies ear pain, drainage, and abnormal odor.  Onset of symptoms 2 to 3 weeks ago.  She reports that her symptoms intensify when lying down.  She denies symptoms of vertigo.  Peripheral edema: She complains of retaining fluid in her lower extremity.  Onset of symptoms was a month ago.  She denies redness, warmth, and pain with ambulation.  She reports limiting her intake of high sodium foods.  She denies orthopnea, chest pain, palpitation, shortness of breath, and chest tightness.  Review of Systems  Constitutional:  Negative for fever.  HENT:  Negative for ear discharge and ear pain.   Respiratory:  Negative for cough.   Cardiovascular:  Negative for chest pain, palpitations and orthopnea.  Musculoskeletal:        Lower extremity edema        Objective:    BP 120/76   Pulse 67   Ht 5\' 5"  (1.651 m)   Wt 190 lb 0.6 oz (86.2 kg)   SpO2 99%   BMI 31.62 kg/m    Physical Exam HENT:     Head: Normocephalic.     Right Ear: There is impacted cerumen.     Left Ear: There is no impacted cerumen.  Cardiovascular:     Rate and Rhythm: Normal rate and regular rhythm.     Pulses: Normal pulses.     Heart sounds: Normal heart sounds.  Musculoskeletal:     Right lower leg: 2+ Edema present.     Left lower leg: 2+ Edema present.  Neurological:     Mental Status: She is alert.     No results found for any visits on 07/13/22.      Assessment & Plan:   Problem List Items Addressed This Visit       Nervous and Auditory   Impacted cerumen of right ear -  Primary    Ear irrigation performed for impacted cerumen of the right ear Encouraged the patient to use over-the-counter Debrox for earwax removal Encouraged the patient to follow-up for persistent whooshing sound in her right ear        Other   Peripheral edema    Low suspicion for heart failure Will prescribe a short course of hydrochlorothiazide 12.5 mg daily as needed for peripheral edema Encourage conservative management with leg elevation, compression, and  decreasing her sodium intake      Relevant Medications   hydrochlorothiazide (HYDRODIURIL) 12.5 MG tablet    Meds ordered this encounter  Medications   DISCONTD: hydrochlorothiazide (HYDRODIURIL) 12.5 MG tablet    Sig: Take 1 tablet (12.5 mg total) by mouth daily.    Dispense:  20 tablet    Refill:  0   hydrochlorothiazide (HYDRODIURIL) 12.5 MG tablet    Sig: Take 1 tablet (12.5 mg total) by mouth daily as needed.    Dispense:  20 tablet    Refill:  0    Return if symptoms worsen or fail to improve.  07/15/22  Denny Levy, FNP

## 2022-07-13 NOTE — Patient Instructions (Addendum)
I appreciate the opportunity to provide care to you today!    Follow up:  Dr. Lodema Hong  Please pick up your medication at the pharmacy   Recommend conservative managements to help decrease swelling in your feet.   -Reduce salt (sodium) in your diet -- Sodium, which is found in table salt and processed foods, can worsen edema. Reducing the amount of salt you consume can help to reduce edema, especially if you also take a diuretic.  -Compression stockings -- Leg edema can be prevented and treated with the use of compression stockings. Stockings are available in several heights, including knee-high, thigh-high, and pantyhose. Knee-high stockings are sufficient for most patients. - Body positioning -- Leg, ankle, and foot edema can be improved by elevating the legs above heart level for 30 minutes three or four times per day. Elevating the legs may be sufficient to reduce or eliminate edema   Please pick up your medication at the pharmacy    Please continue to a heart-healthy diet and increase your physical activities. Try to exercise for at least three times a week.      It was a pleasure to see you and I look forward to continuing to work together on your health and well-being. Please do not hesitate to call the office if you need care or have questions about your care.   Have a wonderful day and week. With Gratitude, Gilmore Laroche MSN, FNP-BC

## 2022-07-13 NOTE — Assessment & Plan Note (Signed)
Ear irrigation performed for impacted cerumen of the right ear Encouraged the patient to use over-the-counter Debrox for earwax removal Encouraged the patient to follow-up for persistent whooshing sound in her right ear

## 2022-08-09 ENCOUNTER — Encounter: Payer: Self-pay | Admitting: Family Medicine

## 2022-08-09 ENCOUNTER — Ambulatory Visit (INDEPENDENT_AMBULATORY_CARE_PROVIDER_SITE_OTHER): Payer: Medicare HMO | Admitting: Family Medicine

## 2022-08-09 VITALS — BP 140/84 | HR 70 | Ht 65.0 in | Wt 188.1 lb

## 2022-08-09 DIAGNOSIS — R0989 Other specified symptoms and signs involving the circulatory and respiratory systems: Secondary | ICD-10-CM | POA: Diagnosis not present

## 2022-08-09 DIAGNOSIS — H938X1 Other specified disorders of right ear: Secondary | ICD-10-CM

## 2022-08-09 DIAGNOSIS — Z0000 Encounter for general adult medical examination without abnormal findings: Secondary | ICD-10-CM | POA: Diagnosis not present

## 2022-08-09 DIAGNOSIS — Z1211 Encounter for screening for malignant neoplasm of colon: Secondary | ICD-10-CM

## 2022-08-09 DIAGNOSIS — H9201 Otalgia, right ear: Secondary | ICD-10-CM

## 2022-08-09 DIAGNOSIS — H60391 Other infective otitis externa, right ear: Secondary | ICD-10-CM

## 2022-08-09 MED ORDER — CIPROFLOXACIN-DEXAMETHASONE 0.3-0.1 % OT SUSP: OTIC | 0 refills | Status: DC

## 2022-08-09 NOTE — Patient Instructions (Addendum)
F/U I n 7 weeks re evaluate blood pressure, you need to start taking HCTZ daily as prescribed  Labs overdue , already entered, pls get them today if fasting, if not, before the end of the week  Ear drop prescribed for right ear , use for 5 days.  You are referred for [place] of neck artery  You are referred to ENT  Think about what you will eat, plan ahead. Choose " clean, green, fresh or frozen" over canned, processed or packaged foods which are more sugary, salty and fatty. 70 to 75% of food eaten should be vegetables and fruit. Three meals at set times with snacks allowed between meals, but they must be fruit or vegetables. Aim to eat over a 12 hour period , example 7 am to 7 pm, and STOP after  your last meal of the day. Drink water,generally about 64 ounces per day, no other drink is as healthy. Fruit juice is best enjoyed in a healthy way, by EATING the fruit.  It is important that you exercise regularly at least 30 minutes 5 times a week. If you develop chest pain, have severe difficulty breathing, or feel very tired, stop exercising immediately and seek medical attention  Please reconsider vaccines needed  Please send  stool for cologuard test, we will order  Thanks for choosing [facility], we consider it a privelige to serve you. HAPPY BIRTHDAY tomorrow

## 2022-08-10 LAB — CMP14+EGFR
ALT: 17 IU/L (ref 0–32)
AST: 21 IU/L (ref 0–40)
Albumin/Globulin Ratio: 1.3 (ref 1.2–2.2)
Albumin: 3.9 g/dL (ref 3.9–4.9)
Alkaline Phosphatase: 85 IU/L (ref 44–121)
BUN/Creatinine Ratio: 18 (ref 12–28)
BUN: 15 mg/dL (ref 8–27)
Bilirubin Total: 0.4 mg/dL (ref 0.0–1.2)
CO2: 24 mmol/L (ref 20–29)
Calcium: 9.4 mg/dL (ref 8.7–10.3)
Chloride: 106 mmol/L (ref 96–106)
Creatinine, Ser: 0.84 mg/dL (ref 0.57–1.00)
Globulin, Total: 3 g/dL (ref 1.5–4.5)
Glucose: 90 mg/dL (ref 70–99)
Potassium: 4.8 mmol/L (ref 3.5–5.2)
Sodium: 144 mmol/L (ref 134–144)
Total Protein: 6.9 g/dL (ref 6.0–8.5)
eGFR: 75 mL/min/{1.73_m2} (ref >59)

## 2022-08-10 LAB — CBC
Hematocrit: 39.4 % (ref 34.0–46.6)
Hemoglobin: 13 g/dL (ref 11.1–15.9)
MCH: 29.9 pg (ref 26.6–33.0)
MCHC: 33 g/dL (ref 31.5–35.7)
MCV: 91 fL (ref 79–97)
Platelets: 162 10*3/uL (ref 150–450)
RBC: 4.35 x10E6/uL (ref 3.77–5.28)
RDW: 12.7 % (ref 11.7–15.4)
WBC: 4.5 10*3/uL (ref 3.4–10.8)

## 2022-08-10 LAB — LIPID PANEL
Chol/HDL Ratio: 2.3 ratio (ref 0.0–4.4)
Cholesterol, Total: 121 mg/dL (ref 100–199)
HDL: 53 mg/dL (ref >39)
LDL Chol Calc (NIH): 58 mg/dL (ref 0–99)
Triglycerides: 39 mg/dL (ref 0–149)
VLDL Cholesterol Cal: 10 mg/dL (ref 5–40)

## 2022-08-10 LAB — VITAMIN D 25 HYDROXY (VIT D DEFICIENCY, FRACTURES): Vit D, 25-Hydroxy: 33.1 ng/mL (ref 30.0–100.0)

## 2022-08-10 LAB — TSH: TSH: 2 u[IU]/mL (ref 0.450–4.500)

## 2022-08-13 ENCOUNTER — Encounter: Payer: Self-pay | Admitting: Family Medicine

## 2022-08-13 DIAGNOSIS — H6090 Unspecified otitis externa, unspecified ear: Secondary | ICD-10-CM | POA: Insufficient documentation

## 2022-08-13 DIAGNOSIS — R0989 Other specified symptoms and signs involving the circulatory and respiratory systems: Secondary | ICD-10-CM | POA: Insufficient documentation

## 2022-08-13 DIAGNOSIS — H9201 Otalgia, right ear: Secondary | ICD-10-CM | POA: Insufficient documentation

## 2022-08-13 NOTE — Progress Notes (Signed)
    Tanya Rogers     MRN: [id number]      DOB: [birth year]  HPI: Patient is in for annual physical exam. C/o right ear pressure.and hearing heartbeat in right ear Immunization is reviewed , and  updated if needed.   PE: BP (!) 140/84   Pulse 70   Ht 5\' 5"  (1.651 m)   Wt 188 lb 1.3 oz (85.3 kg)   SpO2 98%   BMI 31.30 kg/m   Pleasant  female, alert and oriented x 3, in no cardio-pulmonary distress. Afebrile. HEENT No facial trauma or asymetry. Sinuses non tender.  Extra occullar muscles intact.. External ears normal, .Right external ear canal mildly erythematous Neck: supple, no adenopathy,JVD or thyromegaly. bruits.present  Chest: Clear to ascultation bilaterally.No crackles or wheezes. Non tender to palpation   Cardiovascular system; Heart sounds normal,  S1 and  S2 ,no S3.  No murmur, or thrill. Apical beat not displaced Peripheral pulses normal.  Abdomen: Soft, non tender  Musculoskeletal exam: Full ROM of spine, hips , shoulders and knees. No deformity ,swelling or crepitus noted. No muscle wasting or atrophy.   Neurologic: Cranial nerves 2 to 12 intact. Power, tone ,sensation and reflexes normal throughout. No disturbance in gait. No tremor.  Skin: Intact, no ulceration, erythema , scaling or rash noted. Pigmentation normal throughout  Psych; Normal mood and affect. Judgement and concentration normal   Assessment & Plan:  Annual physical exam Annual exam as documented. .   Otalgia, right ear Chronic right ear pain refer ENT  Otitis externa Ciprodex prescribed  Carotid bruit present Carotid doppler

## 2022-08-13 NOTE — Assessment & Plan Note (Signed)
Carotid doppler

## 2022-08-13 NOTE — Assessment & Plan Note (Signed)
Annual exam as documented. 

## 2022-08-13 NOTE — Assessment & Plan Note (Signed)
Ciprodex prescribed 

## 2022-08-13 NOTE — Assessment & Plan Note (Signed)
Chronic right ear pain refer ENT

## 2022-08-15 ENCOUNTER — Telehealth: Payer: Self-pay | Admitting: Family Medicine

## 2022-08-15 NOTE — Telephone Encounter (Signed)
Patient called, she is requesting a different medication for her ear to be called in, stated that her copay for the medication prescribed was $100.   Pt's # [phone]

## 2022-08-15 NOTE — Telephone Encounter (Signed)
Patient called, she is checking on a few referrals, one for a scan for her neck and one to an ENT.  Pt's # [phone]

## 2022-08-16 NOTE — Telephone Encounter (Signed)
Patient calling back today asked to call her back,

## 2022-08-26 NOTE — Telephone Encounter (Signed)
Pt has been scheduled.  °

## 2022-09-01 ENCOUNTER — Other Ambulatory Visit (HOSPITAL_COMMUNITY): Payer: Medicare HMO

## 2022-09-02 ENCOUNTER — Ambulatory Visit (HOSPITAL_COMMUNITY)
Admission: RE | Admit: 2022-09-02 | Discharge: 2022-09-02 | Disposition: A | Discharge status: Home / Self Care | Payer: Medicare HMO | Source: Ambulatory Visit | Attending: Family Medicine | Admitting: Family Medicine

## 2022-09-02 DIAGNOSIS — H938X1 Other specified disorders of right ear: Secondary | ICD-10-CM | POA: Insufficient documentation

## 2022-09-02 DIAGNOSIS — R0989 Other specified symptoms and signs involving the circulatory and respiratory systems: Secondary | ICD-10-CM | POA: Diagnosis not present

## 2022-09-19 LAB — COLOGUARD: COLOGUARD: NEGATIVE

## 2022-09-23 ENCOUNTER — Ambulatory Visit (HOSPITAL_COMMUNITY)
Admission: RE | Admit: 2022-09-23 | Discharge: 2022-09-23 | Disposition: A | Discharge status: Home / Self Care | Payer: Medicare HMO | Source: Ambulatory Visit | Attending: Family Medicine | Admitting: Family Medicine

## 2022-09-23 ENCOUNTER — Ambulatory Visit (INDEPENDENT_AMBULATORY_CARE_PROVIDER_SITE_OTHER): Payer: Medicare HMO | Admitting: Family Medicine

## 2022-09-23 ENCOUNTER — Encounter: Payer: Self-pay | Admitting: Family Medicine

## 2022-09-23 VITALS — BP 140/70 | HR 65 | Ht 65.0 in | Wt 188.0 lb

## 2022-09-23 DIAGNOSIS — M7989 Other specified soft tissue disorders: Secondary | ICD-10-CM

## 2022-09-23 DIAGNOSIS — I10 Essential (primary) hypertension: Secondary | ICD-10-CM | POA: Diagnosis not present

## 2022-09-23 DIAGNOSIS — M79662 Pain in left lower leg: Secondary | ICD-10-CM

## 2022-09-23 DIAGNOSIS — I739 Peripheral vascular disease, unspecified: Secondary | ICD-10-CM

## 2022-09-23 MED ORDER — CEPHALEXIN 500 MG PO CAPS: 500.0000 mg | ORAL_CAPSULE | Freq: Three times a day (TID) | ORAL | 0 refills | Status: DC

## 2022-09-23 MED ORDER — SPIRONOLACTONE 25 MG PO TABS: 25.0000 mg | ORAL_TABLET | Freq: Every day | ORAL | 3 refills | Status: DC

## 2022-09-23 NOTE — Patient Instructions (Addendum)
Reschedule f/u to 4 weeks , call if you need me sooner  [place] of left leg to evaluate for clot  5 day antibiotic course is prescribed for pain and swelling of left leg  STOP HCTZ for blood pressure, new is spironolactone 25 mg one daily, blood pressure is still too high   You are being referred to Vascular surgery to assess circulation In legs  Thanks for choosing [facility], we consider it a privelige to serve you.

## 2022-09-25 DIAGNOSIS — L989 Disorder of the skin and subcutaneous tissue, unspecified: Secondary | ICD-10-CM | POA: Insufficient documentation

## 2022-09-25 DIAGNOSIS — M79662 Pain in left lower leg: Secondary | ICD-10-CM | POA: Insufficient documentation

## 2022-09-25 DIAGNOSIS — I739 Peripheral vascular disease, unspecified: Secondary | ICD-10-CM | POA: Insufficient documentation

## 2022-09-25 NOTE — Assessment & Plan Note (Signed)
Mild cellulitis LLE , short course of keflex prescribed

## 2022-09-25 NOTE — Assessment & Plan Note (Signed)
Refer to vascular for eval, exam of LLE c/w vascular disease

## 2022-09-25 NOTE — Assessment & Plan Note (Signed)
Uncontrolled, change to spironolactone and re eval DASH diet and commitment to daily physical activity for a minimum of 30 minutes discussed and encouraged, as a part of hypertension management. The importance of attaining a healthy weight is also discussed.     09/23/2022   11:02 AM 09/23/2022   10:59 AM 08/09/2022    1:29 PM 08/09/2022    1:17 PM 08/09/2022    1:16 PM 07/13/2022    1:29 PM 02/08/2022    1:09 PM  BP/Weight  Systolic BP 801 655 374 827 078 675 449  Diastolic BP 70 82 84 82 74 76 76  Wt. (Lbs)  188.04   188.08 190.04 182.04  BMI  31.29 kg/m2   31.3 kg/m2 31.62 kg/m2 30.29 kg/m2

## 2022-09-25 NOTE — Assessment & Plan Note (Signed)
Venous doppler to assess for DVT

## 2022-09-25 NOTE — Progress Notes (Signed)
   Tanya Rogers     MRN: [id number]      DOB: 1951/02/02   HPI Tanya Rogers is here c/o left leg pain , swelling and tingling, since 09/18/2022, concerned about possible blood clot Denies shortness of breath or coughing up blood ROS Denies recent fever or chills. Denies sinus pressure, nasal congestion, ear pain or sore throat. Denies chest congestion, productive cough or wheezing. Denies chest pains, palpitations and leg swelling Denies abdominal pain, nausea, vomiting,diarrhea or constipation.   Denies dysuria, frequency, hesitancy or incontinence. . Denies headaches, seizures, numbness, or tingling. Denies depression, anxiety or insomnia. Denies skin break down or rash.   PE  BP (!) 140/70 (BP Location: Right Arm, Patient Position: Sitting, Cuff Size: Large)   Pulse 65   Ht 5\' 5"  (1.651 m)   Wt 188 lb 0.6 oz (85.3 kg)   SpO2 96%   BMI 31.29 kg/m   Patient alert and oriented and in no cardiopulmonary distress.  HEENT: No facial asymmetry, EOMI,     Neck supple .  Chest: Clear to auscultation bilaterally.  CVS: S1, S2 no murmurs, no S3.Regular rate.  ABD: Soft non tender.   Ext: left calf swollen , warm and tender, hyperpigmentation of distal left leg, reduced  DP MS: Adequate ROM spine, shoulders, hips and knees.  Skin: hyperpigmented LLE Psych: Good eye contact, normal affect. Memory intact not anxious or depressed appearing.  CNS: CN 2-12 intact, power,  normal throughout.no focal deficits noted.   Assessment & Plan  Pain and swelling of left lower leg Venous doppler to assess for DVT  PVD (peripheral vascular disease) (HCC) Refer to vascular for eval, exam of LLE c/w vascular disease  Cellulitis of left leg Mild cellulitis LLE , short course of keflex prescribed  Benign essential HTN Uncontrolled, change to spironolactone and re eval DASH diet and commitment to daily physical activity for a minimum of 30 minutes discussed and encouraged, as a  part of hypertension management. The importance of attaining a healthy weight is also discussed.     09/23/2022   11:02 AM 09/23/2022   10:59 AM 08/09/2022    1:29 PM 08/09/2022    1:17 PM 08/09/2022    1:16 PM 07/13/2022    1:29 PM 02/08/2022    1:09 PM  BP/Weight  Systolic BP 735 329 924 268 341 962 229  Diastolic BP 70 82 84 82 74 76 76  Wt. (Lbs)  188.04   188.08 190.04 182.04  BMI  31.29 kg/m2   31.3 kg/m2 31.62 kg/m2 30.29 kg/m2

## 2022-09-28 ENCOUNTER — Ambulatory Visit: Payer: Medicare HMO | Admitting: Family Medicine

## 2022-10-05 ENCOUNTER — Other Ambulatory Visit: Payer: Self-pay | Admitting: *Deleted

## 2022-10-05 DIAGNOSIS — L03116 Cellulitis of left lower limb: Secondary | ICD-10-CM

## 2022-10-13 ENCOUNTER — Ambulatory Visit: Payer: Medicare HMO | Admitting: Physician Assistant

## 2022-10-13 ENCOUNTER — Ambulatory Visit (HOSPITAL_COMMUNITY)
Admission: RE | Admit: 2022-10-13 | Discharge: 2022-10-13 | Disposition: A | Discharge status: Home / Self Care | Payer: Medicare HMO | Source: Ambulatory Visit | Attending: Surgery | Admitting: Surgery

## 2022-10-13 VITALS — BP 147/74 | HR 51 | Temp 97.5°F | Resp 20 | Ht 65.0 in | Wt 189.6 lb

## 2022-10-13 DIAGNOSIS — L03116 Cellulitis of left lower limb: Secondary | ICD-10-CM

## 2022-10-13 NOTE — Progress Notes (Signed)
[facility] [place]   Reason for referral: acute left foot pain with tingling and burning   History of Present Illness  Tanya Rogers is a 72 y.o. female who presents with chief complaint: swollen leg.  Patient notes, onset of symptoms about 5-6 weeks ago.  She had local edema with erythema.  Her PCP gave her antibiotics and the symptoms went away.  He husband has a history of DVT and she was worried that she had a DVT.  She denise edema over the past few weeks since she took Keflex starting on 09/23/22.  No claudication, rest pain or non healing wounds.  She usually walks for 1-3 miles at a time when the weather permits.       The patient has had no history of DVT, no history of varicose vein, no history of venous stasis ulcers, no history of  Lymphedema and no history of skin changes in lower legs.  There is no family history of venous disorders.  The patient has not used compression stockings in the past.        Past Medical History:  Diagnosis Date   Cataract    Phreesia 02/09/2020   Glaucoma 2003 approx   Glaucoma    Phreesia 02/09/2020   Hypertension    Phreesia 02/09/2020   Obesity     Past Surgical History:  Procedure Laterality Date   COLONOSCOPY N/A 04/15/2008   COLONOSCOPY N/A 06/26/2019   Procedure: COLONOSCOPY;  Surgeon: Rogene Houston, MD;  Location: [facility];  Service: Endoscopy;  Laterality: N/A;  830   EYE SURGERY Bilateral 12/2012 , 02/2013   [place], cataract   TONSILLECTOMY     TUBAL LIGATION      Social History   Socioeconomic History   Marital status: Married    Spouse name: Jeneen Rinks    Number of children: 3   Years of education: 12+   Highest education level: 12th grade  Occupational History   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    Social Determinants of Health   Financial Resource Strain: Low Risk  (11/02/2021)   Overall Financial Resource Strain (CARDIA)    Difficulty of Paying Living Expenses: Not very hard  Food Insecurity: No Food Insecurity (11/02/2021)   Hunger Vital Sign    Worried About Running Out of Food in the Last Year: Never true    Ran Out of Food in the Last Year: Never true  Transportation Needs: No Transportation Needs (11/02/2021)   PRAPARE - Hydrologist (Medical): No    Lack of Transportation (Non-Medical): No  Physical Activity: Sufficiently Active (11/02/2021)   Exercise Vital Sign    Days of Exercise per Week: 4 days    Minutes of Exercise per Session: 60 min  Stress: No Stress Concern Present (11/02/2021)   [place]    Feeling of Stress : Not at all  Social Connections: [place] (11/02/2021)   Social Connection and Isolation Panel [NHANES]    Frequency  of Communication with Friends and Family: More than three times a week    Frequency of Social Gatherings with Friends and Family: More than three times a week    Attends Religious Services: More than 4 times per year    Active Member of [employer] or Organizations: Yes    Attends Music therapist: More than 4 times per year    Marital Status: Married  Human resources officer Violence: Not At Risk (11/02/2021)   Humiliation, Afraid, Rape, and Kick questionnaire    Fear of Current or Ex-Partner: No    Emotionally Abused: No    Physically Abused: No    Sexually Abused: No    Family History  Problem Relation Age of Onset   Heart disease Mother    Hypertension Mother    Stroke Mother    Diabetes Mother    Diabetes Father    Hypertension Father    Diabetes Brother    Kidney  disease Brother    Diabetes Brother    Diabetes Brother    Cancer Brother        lung     Current Outpatient Medications on File Prior to Visit  Medication Sig Dispense Refill   Ascorbic Acid (VITAMIN C) 1000 MG tablet Take 1,000 mg by mouth daily.     Calcium Carb-Cholecalciferol (CALCIUM 1000 + D PO) Take 1 tablet by mouth daily.     cephALEXin (KEFLEX) 500 MG capsule Take 1 capsule (500 mg total) by mouth 3 (three) times daily. 15 capsule 0   dorzolamide-timolol (COSOPT) 22.3-6.8 MG/ML ophthalmic solution 1 drop 2 (two) times daily.     Elderberry 575 MG/5ML SYRP Take 5 mLs by mouth daily. Apple Cider Vinegar Raw Honey Propolis     Multiple Vitamin (MULTIVITAMIN) capsule Take 1 capsule by mouth daily.     Omega-3 Fatty Acids (FISH OIL) 1000 MG CAPS Take 1 capsule by mouth daily.     spironolactone (ALDACTONE) 25 MG tablet Take 1 tablet (25 mg total) by mouth daily. 30 tablet 3   zinc gluconate 50 MG tablet Take 50 mg by mouth daily.     No current facility-administered medications on file prior to visit.    Allergies as of 10/13/2022   (No Known Allergies)     ROS:   General:  No weight loss, Fever, chills  HEENT: No recent headaches, no nasal bleeding, no visual changes, no sore throat  Neurologic: No dizziness, blackouts, seizures. No recent symptoms of stroke or mini- stroke. No recent episodes of slurred speech, or temporary blindness.  Cardiac: No recent episodes of chest pain/pressure, no shortness of breath at rest.  No shortness of breath with exertion.  Denies history of atrial fibrillation or irregular heartbeat  Vascular: No history of rest pain in feet.  No history of claudication.  No history of non-healing ulcer, No history of DVT   Pulmonary: No home oxygen, no productive cough, no hemoptysis,  No asthma or wheezing  Musculoskeletal:  [ ]$  Arthritis, [ ]$  Low back pain,  [ ]$  Joint pain  Hematologic:No history of hypercoagulable state.  No history of easy  bleeding.  No history of anemia  Gastrointestinal: No hematochezia or melena,  No gastroesophageal reflux, no trouble swallowing  Urinary: [ ]$  chronic Kidney disease, [ ]$  on HD - [ ]$  MWF or [ ]$  TTHS, [ ]$  Burning with urination, [ ]$  Frequent urination, [ ]$  Difficulty urinating;   Skin: No rashes  Psychological: No history of anxiety,  No history of depression  Physical Examination  Vitals:   10/13/22 1216  BP: (!) 147/74  Pulse: (!) 51  Resp: 20  Temp: (!) 97.5 F (36.4 C)  TempSrc: Temporal  SpO2: 100%  Weight: 189 lb 9.6 oz (86 kg)  Height: 5' 5"$  (1.651 m)    Body mass index is 31.55 kg/m.  General:  Alert and oriented, no acute distress HEENT: Normal Neck: No bruit or JVD Pulmonary: Clear to auscultation bilaterally Cardiac: Regular Rate and Rhythm without murmur Abdomen: Soft, non-tender, non-distended, no mass, no scars Skin: No rash Dark lateral anterior skin area 2 cm circumference and medial area 2-3 cm circumference.   Extremity Pulses:   radial,  femoral, doppler intact dorsalis pedis, brisk multiphasic posterior tibial pulses bilaterally Musculoskeletal: No deformity or edema  Neurologic: Upper and lower extremity motor 5/5 and symmetric  DATA: +--------------+---------+------+-----------+------------+-------------+  LEFT         Reflux NoRefluxReflux TimeDiameter cmsComments                               Yes                                        +--------------+---------+------+-----------+------------+-------------+  CFV                    yes   >1 second                            +--------------+---------+------+-----------+------------+-------------+  FV prox       no                                                   +--------------+---------+------+-----------+------------+-------------+  FV mid        no                                                    +--------------+---------+------+-----------+------------+-------------+  FV dist       no                                                   +--------------+---------+------+-----------+------------+-------------+  Popliteal    no                                                   +--------------+---------+------+-----------+------------+-------------+  GSV at SFJ              yes    >500 ms     0.518                   +--------------+---------+------+-----------+------------+-------------+  GSV prox thighno  0.359                   +--------------+---------+------+-----------+------------+-------------+  GSV mid thigh no                           0.274                   +--------------+---------+------+-----------+------------+-------------+  GSV dist thighno                           0.249    out of fascia  +--------------+---------+------+-----------+------------+-------------+  GSV at knee   no                           0.224                   +--------------+---------+------+-----------+------------+-------------+  GSV prox calf no                           0.183                   +--------------+---------+------+-----------+------------+-------------+  SSV Pop Fossa no                           0.244                   +--------------+---------+------+-----------+------------+-------------+  SSV prox calf no                           0.154                   +--------------+---------+------+-----------+------------+-------------+  SSV mid calf  no                           0.127                   +--------------+---------+------+-----------+------------+-------------+     Summary:  Left:  Thrombus  - No evidence of deep vein thrombosis seen in the left lower extremity,  from the common femoral through the popliteal veins.  - No evidence of superficial venous thrombosis in the left  lower  extremity.   Deep veins  - Reflux in the CFV.   Superficial veins  - Reflux in the SFJ.      Assessment: Mild reflux in the CFV and SFJ.  No DVT.  The vein size past the SFJ does not have reflux nor is the vein size enlarged.  She does not have edema in her LE, other than localized edema due to recent cellulitis.    She has brisk PT multiphasic wave forms and intact DP/peroneal doppler signals.  She denise symptoms of arterial insufficiency.  No claudication, rest pain or non healing wounds.    Plan: She may benefit from dermatologist for the darken skin areas, and possible neuropathy work up with the burning, tingling pain in the right foot plantar aspect.  She denise DM.  F/U with [facility] if she develops daily edema, rest pain or non healing wounds.   Roxy Horseman PA-C [facility] [place] Office: [phone]  MD in clinic [place]

## 2022-10-21 ENCOUNTER — Ambulatory Visit: Payer: Medicare HMO | Admitting: Family Medicine

## 2022-11-14 ENCOUNTER — Ambulatory Visit (INDEPENDENT_AMBULATORY_CARE_PROVIDER_SITE_OTHER): Payer: Medicare HMO

## 2022-11-14 DIAGNOSIS — Z0000 Encounter for general adult medical examination without abnormal findings: Secondary | ICD-10-CM | POA: Diagnosis not present

## 2022-11-14 NOTE — Patient Instructions (Signed)
  Tanya Rogers , Thank you for taking time to come for your Medicare Wellness Visit. I appreciate your ongoing commitment to your health goals. Please review the following plan we discussed and let me know if I can assist you in the future.   These are the goals we discussed:  Goals      DIET - EAT MORE FRUITS AND VEGETABLES     Increase physical activity     Patient Stated     Enjoy life,        This is a list of the screening recommended for you and due dates:  Health Maintenance  Topic Date Due   Zoster (Shingles) Vaccine (1 of 2) Never done   Flu Shot  11/27/2022*   Medicare Annual Wellness Visit  11/14/2023   Mammogram  05/03/2024   Colon Cancer Screening  06/25/2029   Pneumonia Vaccine  Completed   DEXA scan (bone density measurement)  Completed   Hepatitis C Screening: USPSTF Recommendation to screen - Ages 58-79 yo.  Completed   HPV Vaccine  Aged Out   DTaP/Tdap/Td vaccine  Discontinued   COVID-19 Vaccine  Discontinued  *Topic was postponed. The date shown is not the original due date.

## 2022-11-14 NOTE — Progress Notes (Signed)
Subjective:   Tanya Rogers is a 72 y.o. female who presents for Medicare Annual (Subsequent) preventive examination.  Review of Systems    I connected with  Tanya Rogers on 11/14/22 by a audio enabled telemedicine application and verified that I am speaking with the correct person using two identifiers.  Patient Location: Home  Provider Location: Office/Clinic  I discussed the limitations of evaluation and management by telemedicine. The patient expressed understanding and agreed to proceed.        Objective:    There were no vitals filed for this visit. There is no height or weight on file to calculate BMI.     07/21/2020    1:40 PM 06/26/2019    7:42 AM 07/09/2018    2:55 PM  Advanced Directives  Does Patient Have a Medical Advance Directive? No No No  Would patient like information on creating a medical advance directive? No - Patient declined Yes (MAU/Ambulatory/Procedural Areas - Information given) No - Patient declined    Current Medications (verified) Outpatient Encounter Medications as of 11/14/2022  Medication Sig   Ascorbic Acid (VITAMIN C) 1000 MG tablet Take 1,000 mg by mouth daily.   Calcium Carb-Cholecalciferol (CALCIUM 1000 + D PO) Take 1 tablet by mouth daily.   cephALEXin (KEFLEX) 500 MG capsule Take 1 capsule (500 mg total) by mouth 3 (three) times daily.   dorzolamide-timolol (COSOPT) 22.3-6.8 MG/ML ophthalmic solution 1 drop 2 (two) times daily.   Elderberry 575 MG/5ML SYRP Take 5 mLs by mouth daily. Apple Cider Vinegar Raw Honey Propolis   Multiple Vitamin (MULTIVITAMIN) capsule Take 1 capsule by mouth daily.   Omega-3 Fatty Acids (FISH OIL) 1000 MG CAPS Take 1 capsule by mouth daily.   spironolactone (ALDACTONE) 25 MG tablet Take 1 tablet (25 mg total) by mouth daily.   zinc gluconate 50 MG tablet Take 50 mg by mouth daily.   No facility-administered encounter medications on file as of 11/14/2022.    Allergies (verified) Patient has  no known allergies.   History: Past Medical History:  Diagnosis Date   Cataract    Phreesia 02/09/2020   Glaucoma 2003 approx   Glaucoma    Phreesia 02/09/2020   Hypertension    Phreesia 02/09/2020   Obesity    Past Surgical History:  Procedure Laterality Date   COLONOSCOPY N/A 04/15/2008   COLONOSCOPY N/A 06/26/2019   Procedure: COLONOSCOPY;  Surgeon: Rogene Houston, MD;  Location: [facility];  Service: Endoscopy;  Laterality: N/A;  830   EYE SURGERY Bilateral 12/2012 , 02/2013   [place], cataract   TONSILLECTOMY     TUBAL LIGATION     Family History  Problem Relation Age of Onset   Heart disease Mother    Hypertension Mother    Stroke Mother    Diabetes Mother    Diabetes Father    Hypertension Father    Diabetes Brother    Kidney disease Brother    Diabetes Brother    Diabetes Brother    Cancer Brother        lung    Social History   Socioeconomic History   Marital status: Married    Spouse name: Jeneen Rinks    Number of children: 3   Years of education: 12+   Highest education level: 12th grade  Occupational History   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    Social Determinants of Health   Financial Resource Strain: Low Risk  (11/02/2021)   Overall Financial Resource Strain (CARDIA)    Difficulty of Paying Living Expenses: Not very hard  Food Insecurity: No Food Insecurity (11/02/2021)   Hunger Vital Sign    Worried About Running Out of Food in the Last Year: Never true    Ran Out of Food in the Last Year: Never true  Transportation Needs: No Transportation Needs (11/02/2021)   PRAPARE - Civil engineer, contracting (Medical): No    Lack of Transportation (Non-Medical): No  Physical Activity: Sufficiently Active (11/02/2021)   Exercise Vital Sign    Days of Exercise per Week: 4 days    Minutes of Exercise per Session: 60 min  Stress: No Stress Concern Present (11/02/2021)   [place]    Feeling of Stress : Not at all  Social Connections: [place] (11/02/2021)   Social Connection and Isolation Panel [NHANES]    Frequency of Communication with Friends and Family: More than three times a week    Frequency of Social Gatherings with Friends and Family: More than three times a week    Attends Religious Services: More than 4 times per year    Active Member of [employer] or Organizations: Yes    Attends Music therapist: More than 4 times per year    Marital Status: Married    Tobacco Counseling Counseling given: Not Answered   Clinical Intake:    Diabetic?no    Activities of Daily Living     No data to display           Patient Care Team: Fayrene Helper, MD as PCP - General  Indicate any recent Medical Services you may have received from other than [facility] providers in the past year (date may be approximate).     Assessment:   This is a routine wellness examination for [name].  Hearing/Vision screen No results found.  Dietary issues and exercise activities discussed:     Goals Addressed   None   Depression Screen    09/23/2022   11:01 AM 08/09/2022    1:17 PM 07/13/2022    1:31 PM 02/08/2022    1:12 PM 11/02/2021    9:29 AM 11/02/2021    9:25 AM 10/12/2021    3:05 PM  PHQ 2/9 Scores  PHQ - 2 Score 0 0 0 0 0 0 0  PHQ- 9 Score   0        Fall Risk    09/23/2022   11:01 AM 08/09/2022    1:17 PM 07/13/2022    1:31 PM 02/08/2022    1:12 PM 11/02/2021    9:29 AM  Fall Risk   Falls in the past year?  0 0 0 0  Number falls in past yr: 0 0 0 0 0  Injury with Fall? 0 0 0 0 0   Risk for fall due to : No Fall Risks No Fall Risks No Fall Risks No Fall Risks   Follow up Falls evaluation completed Falls evaluation completed Falls evaluation completed Falls evaluation completed  Falls prevention discussed    FALL RISK PREVENTION PERTAINING TO THE HOME:  Any stairs in or around the home? Yes  If so, are there any without handrails? No  Home free of loose throw rugs in walkways, pet beds, electrical cords, etc? Yes  Adequate lighting in your home to reduce risk of falls? Yes   ASSISTIVE DEVICES UTILIZED TO PREVENT FALLS:  Life alert? No  Use of a cane, walker or w/c? No  Grab bars in the bathroom? No  Shower chair or bench in shower? No  Elevated toilet seat or a handicapped toilet? Yes           11/02/2021    9:31 AM 07/21/2020    1:43 PM 07/18/2019    1:52 PM 07/09/2018    2:57 PM  6CIT Screen  What Year? 0 points 0 points 0 points 0 points  What month? 0 points 0 points 0 points 0 points  What time? 0 points 0 points 0 points 0 points  Count back from 20 0 points 0 points 0 points 0 points  Months in reverse  0 points 0 points 0 points  Repeat phrase  0 points 0 points 0 points  Total Score  0 points 0 points 0 points    Immunizations Immunization History  Administered Date(s) Administered   Pneumococcal Conjugate-13 09/06/2016   Pneumococcal Polysaccharide-23 12/20/2017   Tdap 08/17/2011   Zoster, Live 08/17/2011    TDAP status: Up to date  Flu Vaccine status: Due, Education has been provided regarding the importance of this vaccine. Advised may receive this vaccine at local pharmacy or Health Dept. Aware to provide a copy of the vaccination record if obtained from local pharmacy or Health Dept. Verbalized acceptance and understanding.  Pneumococcal vaccine status: Up to date  Covid-19 vaccine status: Information provided on how to obtain vaccines.   Qualifies for Shingles Vaccine? Yes   Zostavax completed No   Shingrix Completed?: No.     Education has been provided regarding the importance of this vaccine. Patient has been advised to call insurance company to determine out of pocket expense if they have not yet received this vaccine. Advised may also receive vaccine at local pharmacy or Health Dept. Verbalized acceptance and understanding.  Screening Tests Health Maintenance  Topic Date Due   Zoster Vaccines- Shingrix (1 of 2) Never done   INFLUENZA VACCINE  11/27/2022 (Originally 03/29/2022)   Medicare Annual Wellness (AWV)  11/14/2023   MAMMOGRAM  05/03/2024   COLONOSCOPY (Pts 45-68yrs Insurance coverage will need to be confirmed)  06/25/2029   Pneumonia Vaccine 65+ Years old  Completed   DEXA SCAN  Completed   Hepatitis C Screening  Completed   HPV VACCINES  Aged Out   DTaP/Tdap/Td  Discontinued   COVID-19 Vaccine  Discontinued    Health Maintenance  Health Maintenance Due  Topic Date Due   Zoster Vaccines- Shingrix (1 of 2) Never done    Colorectal cancer screening: Type of screening: Colonoscopy. Completed 06/26/19. Repeat every 10 years  Mammogram status: Completed 05/03/22. Repeat every year  Bone Density status: Completed 02/25/20. Results reflect: Bone density results: NORMAL. Repeat every 2 years.  Lung Cancer Screening: (Low Dose CT Chest recommended if Age 47-80 years, 30 pack-year currently smoking OR have quit w/in 15years.) does not qualify.   Lung Cancer Screening Referral: no  Additional Screening:  Hepatitis C Screening: does qualify; Completed 09/06/16  Vision Screening: Recommended annual ophthalmology exams for early detection of glaucoma and  other disorders of the eye. Is the patient up to date with their annual eye exam?  Yes  Who is the provider or what is the name of the office in which the patient attends annual eye exams? [facility] If pt is not established with a provider, would they like to be referred to a provider to establish care? No .   Dental Screening:  Recommended annual dental exams for proper oral hygiene  Community Resource Referral / Chronic Care Management: CRR required this visit?  No   CCM required this visit?  No      Plan:     I have personally reviewed and noted the following in the patient's chart:   Medical and social history Use of alcohol, tobacco or illicit drugs  Current medications and supplements including opioid prescriptions. Patient is not currently taking opioid prescriptions. Functional ability and status Nutritional status Physical activity Advanced directives List of other physicians Hospitalizations, surgeries, and ER visits in previous 12 months Vitals Screenings to include cognitive, depression, and falls Referrals and appointments  In addition, I have reviewed and discussed with patient certain preventive protocols, quality metrics, and best practice recommendations. A written personalized care plan for preventive services as well as general preventive health recommendations were provided to patient.     Quentin Angst, [place]   11/14/2022

## 2022-11-17 ENCOUNTER — Ambulatory Visit: Payer: Medicare HMO | Admitting: Family Medicine

## 2023-02-15 ENCOUNTER — Ambulatory Visit (INDEPENDENT_AMBULATORY_CARE_PROVIDER_SITE_OTHER): Payer: Medicare HMO | Admitting: Family Medicine

## 2023-02-15 ENCOUNTER — Encounter: Payer: Self-pay | Admitting: Family Medicine

## 2023-02-15 VITALS — BP 146/82 | HR 55 | Ht 65.0 in | Wt 196.1 lb

## 2023-02-15 DIAGNOSIS — L989 Disorder of the skin and subcutaneous tissue, unspecified: Secondary | ICD-10-CM | POA: Diagnosis not present

## 2023-02-15 DIAGNOSIS — M25562 Pain in left knee: Secondary | ICD-10-CM

## 2023-02-15 DIAGNOSIS — R7303 Prediabetes: Secondary | ICD-10-CM

## 2023-02-15 DIAGNOSIS — I10 Essential (primary) hypertension: Secondary | ICD-10-CM | POA: Diagnosis not present

## 2023-02-15 MED ORDER — SPIRONOLACTONE 25 MG PO TABS: 25.0000 mg | ORAL_TABLET | Freq: Every day | ORAL | 3 refills | Status: DC

## 2023-02-15 MED ORDER — POTASSIUM 99 MG PO TABS: ORAL_TABLET | ORAL | 1 refills | Status: DC

## 2023-02-15 MED ORDER — HYDROCHLOROTHIAZIDE 12.5 MG PO CAPS: 12.5000 mg | ORAL_CAPSULE | Freq: Every day | ORAL | 2 refills | Status: DC

## 2023-02-15 NOTE — Patient Instructions (Signed)
Follow-up in 8 weeks, reevaluate blood pressure, call if you need me sooner.    New for blood pressure is spironolactone 25 mg 1 daily important that you take this regularly.  You are referred to orthopedics to be swelling behind left knee which is painful.  You are referred to dermatology regarding lesion on the left leg which is nodular and is tender  Please bring BP cuff to visit  Chem 7 and EGFr today and hBA1C  It is important that you exercise regularly at least 30 minutes 5 times a week. If you develop chest pain, have severe difficulty breathing, or feel very tired, stop exercising immediately and seek medical attention  Thanks for choosing [facility], we consider it a privelige to serve you.

## 2023-02-16 LAB — HEMOGLOBIN A1C
Est. average glucose Bld gHb Est-mCnc: 114 mg/dL
Hgb A1c MFr Bld: 5.6 % (ref 4.8–5.6)

## 2023-02-16 LAB — BMP8+EGFR
BUN/Creatinine Ratio: 21 (ref 12–28)
BUN: 21 mg/dL (ref 8–27)
CO2: 23 mmol/L (ref 20–29)
Calcium: 9.2 mg/dL (ref 8.7–10.3)
Chloride: 105 mmol/L (ref 96–106)
Creatinine, Ser: 0.99 mg/dL (ref 0.57–1.00)
Glucose: 74 mg/dL (ref 70–99)
Potassium: 4.7 mmol/L (ref 3.5–5.2)
Sodium: 140 mmol/L (ref 134–144)
eGFR: 61 mL/min/{1.73_m2} (ref >59)

## 2023-02-19 ENCOUNTER — Encounter: Payer: Self-pay | Admitting: Family Medicine

## 2023-02-19 DIAGNOSIS — M25562 Pain in left knee: Secondary | ICD-10-CM | POA: Insufficient documentation

## 2023-02-19 NOTE — Assessment & Plan Note (Signed)
Bakers cyst present, reports painful refer Ortho

## 2023-02-19 NOTE — Progress Notes (Signed)
   ALLINA RICHES     MRN: [id number]      DOB: [birth year]  Chief Complaint  Patient presents with   Follow-up    Leg bruising and soreness, swelling    HPI Ms. Bevis is here with above concerns   ROS Denies recent fever or chills. Denies sinus pressure, nasal congestion, ear pain or sore throat. Denies chest congestion, productive cough or wheezing. Denies chest pains, palpitations and leg swelling Denies abdominal pain, nausea, vomiting,diarrhea or constipation.   Denies dysuria, frequency, hesitancy or incontinence.  Denies headaches, seizures, numbness, or tingling. Denies depression, anxiety or insomnia. C/o tender dark nodular lesion on right leg which persists over time, and concerns her , also tender swelling behind left knee PE  BP (!) 146/82   Pulse (!) 55   Ht 5\' 5"  (1.651 m)   Wt 196 lb 1.3 oz (88.9 kg)   SpO2 98%   BMI 32.63 kg/m   Patient alert and oriented and in no cardiopulmonary distress.  HEENT: No facial asymmetry, EOMI,     Neck supple .  Chest: Clear to auscultation bilaterally.  CVS: S1, S2 no murmurs, no S3.Regular rate.  ABD: Soft non tender.   Ext: No edema  MS: Adequate ROM spine, shoulders, hips and  reduced in left knee with palpable cyst  Skin: Intact, hyperpigmented macular lesion on LLE, max diameter approx 5 in, no nodules appreciated onexam  Psych: Good eye contact, normal affect. Memory intact not anxious or depressed appearing.  CNS: CN 2-12 intact, power,  normal throughout.no focal deficits noted.   Assessment & Plan  No problem-specific Assessment & Plan notes found for this encounter.

## 2023-02-19 NOTE — Assessment & Plan Note (Signed)
Uncontrolled, needs to resume daily medication DASH diet and commitment to daily physical activity for a minimum of 30 minutes discussed and encouraged, as a part of hypertension management. The importance of attaining a healthy weight is also discussed.     02/15/2023    3:00 PM 02/15/2023    2:59 PM 02/15/2023    2:06 PM 10/13/2022   12:16 PM 09/23/2022   11:02 AM 09/23/2022   10:59 AM 08/09/2022    1:29 PM  BP/Weight  Systolic BP 146 150 131 147 140 141 140  Diastolic BP 82 80 76 74 70 82 84  Wt. (Lbs)   196.08 189.6  188.04   BMI   32.63 kg/m2 31.55 kg/m2  31.29 kg/m2

## 2023-02-19 NOTE — Assessment & Plan Note (Signed)
Chronic nodulara hyperpigmented lesion of LLE, refer dermatology

## 2023-02-19 NOTE — Assessment & Plan Note (Signed)
Patient educated about the importance of limiting  Carbohydrate intake , the need to commit to daily physical activity for a minimum of 30 minutes , and to commit weight loss. The fact that changes in all these areas will reduce or eliminate all together the development of diabetes is stressed.      Latest Ref Rng & Units 02/15/2023    3:18 PM 08/09/2022    2:02 PM 03/26/2021   12:16 PM 11/26/2020    3:27 PM 02/20/2020   12:11 PM  Diabetic Labs  HbA1c 4.8 - 5.6 % 5.6    5.7    5.7    Chol 100 - 199 mg/dL  161  096   045   HDL >40 mg/dL  53  48   53   Calc LDL 0 - 99 mg/dL  58  58   58   Triglycerides 0 - 149 mg/dL  39  52   53   Creatinine 0.57 - 1.00 mg/dL 9.81  1.91  4.78   2.95       02/15/2023    3:00 PM 02/15/2023    2:59 PM 02/15/2023    2:06 PM 10/13/2022   12:16 PM 09/23/2022   11:02 AM 09/23/2022   10:59 AM 08/09/2022    1:29 PM  BP/Weight  Systolic BP 146 150 131 147 140 141 140  Diastolic BP 82 80 76 74 70 82 84  Wt. (Lbs)   196.08 189.6  188.04   BMI   32.63 kg/m2 31.55 kg/m2  31.29 kg/m2        No data to display          Updated lab at visit, NORMAL blood sugar which is great.

## 2023-02-23 ENCOUNTER — Ambulatory Visit: Payer: Medicare HMO | Admitting: Orthopedic Surgery

## 2023-02-27 ENCOUNTER — Ambulatory Visit: Payer: Medicare HMO | Admitting: Orthopedic Surgery

## 2023-03-13 ENCOUNTER — Ambulatory Visit: Payer: Medicare HMO | Admitting: Orthopedic Surgery

## 2023-03-13 ENCOUNTER — Other Ambulatory Visit (INDEPENDENT_AMBULATORY_CARE_PROVIDER_SITE_OTHER): Payer: Medicare HMO

## 2023-03-13 ENCOUNTER — Encounter: Payer: Self-pay | Admitting: Orthopedic Surgery

## 2023-03-13 VITALS — BP 136/83 | HR 60 | Ht 65.0 in | Wt 194.0 lb

## 2023-03-13 DIAGNOSIS — G8929 Other chronic pain: Secondary | ICD-10-CM | POA: Diagnosis not present

## 2023-03-13 DIAGNOSIS — I872 Venous insufficiency (chronic) (peripheral): Secondary | ICD-10-CM

## 2023-03-13 DIAGNOSIS — M25562 Pain in left knee: Secondary | ICD-10-CM

## 2023-03-13 DIAGNOSIS — I878 Other specified disorders of veins: Secondary | ICD-10-CM | POA: Diagnosis not present

## 2023-03-13 MED ORDER — MELOXICAM 7.5 MG PO TABS: 7.5000 mg | ORAL_TABLET | Freq: Every day | ORAL | 5 refills | Status: DC

## 2023-03-13 NOTE — Progress Notes (Signed)
Chief Complaint  Patient presents with   Knee Pain    Left knee pain, behind the knee   Tanya Rogers is 72 years old she presents for evaluation of left knee pain  The pain is actually in the popliteal fossa and it is associated with swelling.  She says the pain is not as bad as it was when the referral was made although the swelling is still there.  She also has an ulcer on the medial side of her left lower leg and has been referred to dermatology for evaluation.  She denies any anterior knee pain.  She has not had any x-rays done.  I looked in her records and she has had venous ultrasound which makes no mention of any type of popliteal cyst   Review of systems she is notable for flank pain and listed everything else is normal  She is not on any anticoagulation she does take spironolactone  Problem list, medical hx, medications and allergies reviewed    BP 136/83   Pulse 60   Ht 5\' 5"  (1.651 m)   Wt 194 lb (88 kg)   BMI 32.28 kg/m   Physical Exam Vitals and nursing note reviewed.  Constitutional:      Appearance: Normal appearance.  HENT:     Head: Normocephalic and atraumatic.  Eyes:     General: No scleral icterus.       Right eye: No discharge.        Left eye: No discharge.     Extraocular Movements: Extraocular movements intact.     Conjunctiva/sclera: Conjunctivae normal.     Pupils: Pupils are equal, round, and reactive to light.  Cardiovascular:     Rate and Rhythm: Normal rate.     Pulses: Normal pulses.  Skin:    General: Skin is warm and dry.     Capillary Refill: Capillary refill takes less than 2 seconds.     Comments: 2 areas of darkening and 1 area of ulceration medial side lower leg left leg, also increased pigmentation both lower legs  Neurological:     General: No focal deficit present.     Mental Status: She is alert and oriented to person, place, and time.  Psychiatric:        Mood and Affect: Mood normal.        Behavior: Behavior normal.         Thought Content: Thought content normal.        Judgment: Judgment normal.    There is fullness in the back of both popliteal fossa's 1 is tender on the left the right does not.  The patient says the right is bigger than the left.  Although I do not appreciate this I will x-ray her knee and also do an ultrasound of her popliteal space  Imaging in the office shows no arthritis of the joint fairly well-preserved articular surfaces in both knees  There are no osteophytes  We also did an ultrasound of the popliteal fossa which did not reveal any fluid this was also performed with aspiration of the area which was negative.  Encounter Diagnoses  Name Primary?   Chronic pain of left knee Yes   Venous stasis dermatitis    Venous stasis     Assessment and plan 72 year old female with pain in the popliteal fossa of her left knee who has been worked up with a venous ultrasound.  She has skin discoloration and increased pigmentation with swelling and ulceration on  the left leg which is superficial and actually has a scab over it and negative x-ray and aspiration of the popliteal fossa for popliteal cyst  Recommend anti-inflammatories for 30 days if no improvement vascular vein consultation will be needed.  Patient should see the dermatologist.  Meds ordered this encounter  Medications   meloxicam (MOBIC) 7.5 MG tablet    Sig: Take 1 tablet (7.5 mg total) by mouth daily.    Dispense:  30 tablet    Refill:  5    Procedures Yes  Aspiration of the popliteal space under ultrasound guidance  The patient was placed in the prone position after giving consent to aspirate her left knee for possible fluid in the popliteal space  After establishing good visualization with the ultrasound the area for injection aspiration was cleaned with alcohol and then an 18-gauge needle was placed with good visualization of the needle into the space in question.  2 attempts at aspiration were performed 1  superficial 1 deep and both were negative

## 2023-03-13 NOTE — Patient Instructions (Addendum)
[phone] is the phone number for the dermatology office Dr Margo Aye he is downstairs from our office here

## 2023-04-04 ENCOUNTER — Other Ambulatory Visit (HOSPITAL_COMMUNITY): Payer: Self-pay | Admitting: Family Medicine

## 2023-04-04 ENCOUNTER — Ambulatory Visit (INDEPENDENT_AMBULATORY_CARE_PROVIDER_SITE_OTHER): Payer: Medicare HMO | Admitting: Family Medicine

## 2023-04-04 VITALS — BP 140/82 | HR 56 | Ht 65.0 in | Wt 197.0 lb

## 2023-04-04 DIAGNOSIS — E669 Obesity, unspecified: Secondary | ICD-10-CM | POA: Diagnosis not present

## 2023-04-04 DIAGNOSIS — R7303 Prediabetes: Secondary | ICD-10-CM

## 2023-04-04 DIAGNOSIS — I10 Essential (primary) hypertension: Secondary | ICD-10-CM

## 2023-04-04 DIAGNOSIS — Z1231 Encounter for screening mammogram for malignant neoplasm of breast: Secondary | ICD-10-CM

## 2023-04-04 NOTE — Assessment & Plan Note (Signed)
  Patient re-educated about  the importance of commitment to a  minimum of 150 minutes of exercise per week as able.  The importance of healthy food choices with portion control discussed, as well as eating regularly and within a 12 hour window most days. The need to choose "clean , green" food 50 to 75% of the time is discussed, as well as to make water the primary drink and set a goal of 64 ounces water daily.       04/04/2023    3:09 PM 03/13/2023   11:36 AM 02/15/2023    2:06 PM  Weight /BMI  Weight 197 lb 0.6 oz 194 lb 196 lb 1.3 oz  Height 5\' 5"  (1.651 m) 5\' 5"  (1.651 m) 5\' 5"  (1.651 m)  BMI 32.79 kg/m2 32.28 kg/m2 32.63 kg/m2    unchanged

## 2023-04-04 NOTE — Patient Instructions (Addendum)
F/u in 10 weeks with EKG in office  call . If you need me sooner  PLEASE commit to taking BP medication EVERY DAY at the same time  Work ion weight loss through plant based eating and control of portion size and exercise  Thanks for choosing [facility], we consider it a privelige to serve you.   Pls schedule September mammogram at checkout

## 2023-04-04 NOTE — Assessment & Plan Note (Signed)
Uncontrolled secondary to  on compoliance, importance of same is stressed DASH diet and commitment to daily physical activity for a minimum of 30 minutes discussed and encouraged, as a part of hypertension management. The importance of attaining a healthy weight is also discussed.     04/04/2023    3:44 PM 04/04/2023    3:10 PM 04/04/2023    3:09 PM 03/13/2023   11:36 AM 02/15/2023    3:00 PM 02/15/2023    2:59 PM 02/15/2023    2:06 PM  BP/Weight  Systolic BP 140 141 148 136 146 150 131  Diastolic BP 82 67 80 83 82 80 76  Wt. (Lbs)   197.04 194   196.08  BMI   32.79 kg/m2 32.28 kg/m2   32.63 kg/m2

## 2023-04-04 NOTE — Progress Notes (Signed)
Tanya Rogers     MRN: [id number]      DOB: June 22, 1951  Chief Complaint  Patient presents with   Follow-up    Follow up bp     HPI Tanya Rogers is here for follow up and re-evaluation of chronic medical conditions, inn particular hypertension, still reports NOT taking medication daily as prescribed, states she has not taken any medication for over 1 week, denies any adverse s/e on medication States get good BP at home Preventive health is updated, specifically  Cancer screening and Immunization.   Questions or concerns regarding consultations or procedures which the PT has had in the interim are  addressed. The PT denies any adverse reactions to current medications since the last visit.  There are no new concerns.  There are no specific complaints   ROS Denies recent fever or chills. Denies sinus pressure, nasal congestion, ear pain or sore throat. Denies chest congestion, productive cough or wheezing. Denies chest pains, palpitations and leg swelling Denies abdominal pain, nausea, vomiting,diarrhea or constipation.   Denies dysuria, frequency, hesitancy or incontinence. Denies joint pain, swelling and limitation in mobility. Denies headaches, seizures, numbness, or tingling. Denies depression, anxiety or insomnia. Denies skin break down or rash.   PE  BP (!) 140/82   Pulse (!) 56   Ht 5\' 5"  (1.651 m)   Wt 197 lb 0.6 oz (89.4 kg)   SpO2 98%   BMI 32.79 kg/m   Patient alert and oriented and in no cardiopulmonary distress.  HEENT: No facial asymmetry, EOMI,     Neck supple .  Chest: Clear to auscultation bilaterally.  CVS: S1, S2 no murmurs, no S3.Regular rate.  ABD: Soft non tender.   Ext: No edema  MS: Adequate ROM spine, shoulders, hips and knees.  Skin: Intact, no ulcerations or rash noted.  Psych: Good eye contact, normal affect. Memory intact not anxious or depressed appearing.  CNS: CN 2-12 intact, power,  normal throughout.no focal deficits  noted.   Assessment & Plan  Benign essential HTN Uncontrolled secondary to  on compoliance, importance of same is stressed DASH diet and commitment to daily physical activity for a minimum of 30 minutes discussed and encouraged, as a part of hypertension management. The importance of attaining a healthy weight is also discussed.     04/04/2023    3:44 PM 04/04/2023    3:10 PM 04/04/2023    3:09 PM 03/13/2023   11:36 AM 02/15/2023    3:00 PM 02/15/2023    2:59 PM 02/15/2023    2:06 PM  BP/Weight  Systolic BP 140 141 148 136 146 150 131  Diastolic BP 82 67 80 83 82 80 76  Wt. (Lbs)   197.04 194   196.08  BMI   32.79 kg/m2 32.28 kg/m2   32.63 kg/m2       Prediabetes Patient educated about the importance of limiting  Carbohydrate intake , the need to commit to daily physical activity for a minimum of 30 minutes , and to commit weight loss. The fact that changes in all these areas will reduce or eliminate all together the development of diabetes is stressed.  improved     Latest Ref Rng & Units 02/15/2023    3:18 PM 08/09/2022    2:02 PM 03/26/2021   12:16 PM 11/26/2020    3:27 PM 02/20/2020   12:11 PM  Diabetic Labs  HbA1c 4.8 - 5.6 % 5.6    5.7    5.7  Chol 100 - 199 mg/dL  161  096   045   HDL >40 mg/dL  53  48   53   Calc LDL 0 - 99 mg/dL  58  58   58   Triglycerides 0 - 149 mg/dL  39  52   53   Creatinine 0.57 - 1.00 mg/dL 9.81  1.91  4.78   2.95       04/04/2023    3:44 PM 04/04/2023    3:10 PM 04/04/2023    3:09 PM 03/13/2023   11:36 AM 02/15/2023    3:00 PM 02/15/2023    2:59 PM 02/15/2023    2:06 PM  BP/Weight  Systolic BP 140 141 148 136 146 150 131  Diastolic BP 82 67 80 83 82 80 76  Wt. (Lbs)   197.04 194   196.08  BMI   32.79 kg/m2 32.28 kg/m2   32.63 kg/m2       No data to display            Obesity (BMI 30.0-34.9)  Patient re-educated about  the importance of commitment to a  minimum of 150 minutes of exercise per week as able.  The importance of  healthy food choices with portion control discussed, as well as eating regularly and within a 12 hour window most days. The need to choose "clean , green" food 50 to 75% of the time is discussed, as well as to make water the primary drink and set a goal of 64 ounces water daily.       04/04/2023    3:09 PM 03/13/2023   11:36 AM 02/15/2023    2:06 PM  Weight /BMI  Weight 197 lb 0.6 oz 194 lb 196 lb 1.3 oz  Height 5\' 5"  (1.651 m) 5\' 5"  (1.651 m) 5\' 5"  (1.651 m)  BMI 32.79 kg/m2 32.28 kg/m2 32.63 kg/m2    unchanged

## 2023-04-04 NOTE — Assessment & Plan Note (Addendum)
Patient educated about the importance of limiting  Carbohydrate intake , the need to commit to daily physical activity for a minimum of 30 minutes , and to commit weight loss. The fact that changes in all these areas will reduce or eliminate all together the development of diabetes is stressed.  improved     Latest Ref Rng & Units 02/15/2023    3:18 PM 08/09/2022    2:02 PM 03/26/2021   12:16 PM 11/26/2020    3:27 PM 02/20/2020   12:11 PM  Diabetic Labs  HbA1c 4.8 - 5.6 % 5.6    5.7    5.7    Chol 100 - 199 mg/dL  409  811   914   HDL >78 mg/dL  53  48   53   Calc LDL 0 - 99 mg/dL  58  58   58   Triglycerides 0 - 149 mg/dL  39  52   53   Creatinine 0.57 - 1.00 mg/dL 2.95  6.21  3.08   6.57       04/04/2023    3:44 PM 04/04/2023    3:10 PM 04/04/2023    3:09 PM 03/13/2023   11:36 AM 02/15/2023    3:00 PM 02/15/2023    2:59 PM 02/15/2023    2:06 PM  BP/Weight  Systolic BP 140 141 148 136 146 150 131  Diastolic BP 82 67 80 83 82 80 76  Wt. (Lbs)   197.04 194   196.08  BMI   32.79 kg/m2 32.28 kg/m2   32.63 kg/m2       No data to display

## 2023-04-13 ENCOUNTER — Ambulatory Visit: Payer: Medicare HMO | Admitting: Orthopedic Surgery

## 2023-05-04 ENCOUNTER — Emergency Department (HOSPITAL_COMMUNITY): Payer: Medicare HMO

## 2023-05-04 ENCOUNTER — Other Ambulatory Visit: Payer: Self-pay

## 2023-05-04 ENCOUNTER — Emergency Department (HOSPITAL_COMMUNITY)
Admission: EM | Admit: 2023-05-04 | Discharge: 2023-05-04 | Disposition: A | Discharge status: Home / Self Care | Payer: Medicare HMO | Attending: Emergency Medicine | Admitting: Emergency Medicine

## 2023-05-04 DIAGNOSIS — R109 Unspecified abdominal pain: Secondary | ICD-10-CM | POA: Insufficient documentation

## 2023-05-04 DIAGNOSIS — N200 Calculus of kidney: Secondary | ICD-10-CM

## 2023-05-04 DIAGNOSIS — R112 Nausea with vomiting, unspecified: Secondary | ICD-10-CM | POA: Diagnosis not present

## 2023-05-04 LAB — BASIC METABOLIC PANEL WITH GFR
Anion gap: 11 (ref 5–15)
BUN: 23 mg/dL (ref 8–23)
CO2: 24 mmol/L (ref 22–32)
Calcium: 8.8 mg/dL — ABNORMAL LOW (ref 8.9–10.3)
Chloride: 104 mmol/L (ref 98–111)
Creatinine, Ser: 1.11 mg/dL — ABNORMAL HIGH (ref 0.44–1.00)
GFR, Estimated: 53 mL/min — ABNORMAL LOW
Glucose, Bld: 119 mg/dL — ABNORMAL HIGH (ref 70–99)
Potassium: 4.1 mmol/L (ref 3.5–5.1)
Sodium: 139 mmol/L (ref 135–145)

## 2023-05-04 LAB — CBC
HCT: 38.5 % (ref 36.0–46.0)
Hemoglobin: 12.8 g/dL (ref 12.0–15.0)
MCH: 30.7 pg (ref 26.0–34.0)
MCHC: 33.2 g/dL (ref 30.0–36.0)
MCV: 92.3 fL (ref 80.0–100.0)
Platelets: 165 K/uL (ref 150–400)
RBC: 4.17 MIL/uL (ref 3.87–5.11)
RDW: 13.9 % (ref 11.5–15.5)
WBC: 6.8 K/uL (ref 4.0–10.5)
nRBC: 0 % (ref 0.0–0.2)

## 2023-05-04 LAB — URINALYSIS, ROUTINE W REFLEX MICROSCOPIC
Bilirubin Urine: NEGATIVE
Glucose, UA: NEGATIVE mg/dL
Hgb urine dipstick: NEGATIVE
Ketones, ur: NEGATIVE mg/dL
Leukocytes,Ua: NEGATIVE
Nitrite: NEGATIVE
Protein, ur: NEGATIVE mg/dL
Specific Gravity, Urine: 1.015 (ref 1.005–1.030)
pH: 7 (ref 5.0–8.0)

## 2023-05-04 MED ORDER — HYDROMORPHONE HCL 1 MG/ML IJ SOLN
0.5000 mg | Freq: Once | INTRAMUSCULAR | Status: AC
  Administered 2023-05-04: 0.5 mg via INTRAVENOUS
  Filled 2023-05-04: qty 0.5

## 2023-05-04 MED ORDER — TAMSULOSIN HCL 0.4 MG PO CAPS: 0.4000 mg | ORAL_CAPSULE | Freq: Every day | ORAL | 0 refills | Status: DC

## 2023-05-04 MED ORDER — OXYCODONE-ACETAMINOPHEN 5-325 MG PO TABS: 1.0000 | ORAL_TABLET | Freq: Four times a day (QID) | ORAL | 0 refills | Status: DC | PRN

## 2023-05-04 MED ORDER — NAPROXEN 500 MG PO TABS: 500.0000 mg | ORAL_TABLET | Freq: Two times a day (BID) | ORAL | 0 refills | Status: DC

## 2023-05-04 MED ORDER — KETOROLAC TROMETHAMINE 30 MG/ML IJ SOLN
30.0000 mg | Freq: Once | INTRAMUSCULAR | Status: AC
  Administered 2023-05-04: 30 mg via INTRAVENOUS
  Filled 2023-05-04: qty 1

## 2023-05-04 MED ORDER — ONDANSETRON HCL 4 MG/2ML IJ SOLN
4.0000 mg | Freq: Once | INTRAMUSCULAR | Status: AC
  Administered 2023-05-04: 4 mg via INTRAVENOUS
  Filled 2023-05-04: qty 2

## 2023-05-04 MED ORDER — ONDANSETRON 4 MG PO TBDP: 4.0000 mg | ORAL_TABLET | Freq: Four times a day (QID) | ORAL | 0 refills | Status: DC | PRN

## 2023-05-04 NOTE — ED Provider Notes (Signed)
[place] EMERGENCY DEPARTMENT AT [facility] Provider Note   CSN: [id number] Arrival date & time: 05/04/23  0093     History  Chief Complaint  Patient presents with   Flank Pain    Tanya Rogers is a 72 y.o. female.  Patient presents with right flank pain.  Patient reports that she was awakened from sleep with this pain.  She did not have pain when she went to sleep.  Patient with nausea and vomiting associated with the pain.       Home Medications Prior to Admission medications   Medication Sig Start Date End Date Taking? Authorizing Provider  Ascorbic Acid (VITAMIN C) 1000 MG tablet Take 1,000 mg by mouth daily.    [provider]  Calcium Carb-Cholecalciferol (CALCIUM 1000 + D PO) Take 1 tablet by mouth daily.    [provider]  dorzolamide-timolol (COSOPT) 22.3-6.8 MG/ML ophthalmic solution 1 drop 2 (two) times daily.    [provider]  Elderberry 575 MG/5ML SYRP Take 5 mLs by mouth daily. Apple Cider Vinegar Raw Honey Propolis    [provider]  meloxicam (MOBIC) 7.5 MG tablet Take 1 tablet (7.5 mg total) by mouth daily. 03/13/23   Vickki Hearing, MD  Multiple Vitamin (MULTIVITAMIN) capsule Take 1 capsule by mouth daily.    [provider]  Omega-3 Fatty Acids (FISH OIL) 1000 MG CAPS Take 1 capsule by mouth daily.    [provider]  spironolactone (ALDACTONE) 25 MG tablet Take 1 tablet (25 mg total) by mouth daily. 02/15/23   Kerri Perches, MD  zinc gluconate 50 MG tablet Take 50 mg by mouth daily.    [provider]      Allergies    Patient has no known allergies.    Review of Systems   Review of Systems  Physical Exam Updated Vital Signs BP (!) 158/69   Pulse 63   Temp (!) 97.5 F (36.4 C) (Oral)   Resp 14   Wt 88.5 kg   SpO2 100%   BMI 32.45 kg/m  Physical Exam Vitals and nursing note reviewed.  Constitutional:      General: She is in acute distress.      Appearance: She is well-developed.  HENT:     Head: Normocephalic and atraumatic.     Mouth/Throat:     Mouth: Mucous membranes are moist.  Eyes:     General: Vision grossly intact. Gaze aligned appropriately.     Extraocular Movements: Extraocular movements intact.     Conjunctiva/sclera: Conjunctivae normal.  Cardiovascular:     Rate and Rhythm: Normal rate and regular rhythm.     Pulses: Normal pulses.     Heart sounds: Normal heart sounds, S1 normal and S2 normal. No murmur heard.    No friction rub. No gallop.  Pulmonary:     Effort: Pulmonary effort is normal. No respiratory distress.     Breath sounds: Normal breath sounds.  Abdominal:     General: Bowel sounds are normal.     Palpations: Abdomen is soft.     Tenderness: There is no abdominal tenderness. There is right CVA tenderness. There is no guarding or rebound.     Hernia: No hernia is present.  Musculoskeletal:        General: No swelling.     Cervical back: Full passive range of motion without pain, normal range of motion and neck supple. No spinous process tenderness or muscular tenderness. Normal range  of motion.     Right lower leg: No edema.     Left lower leg: No edema.  Skin:    General: Skin is warm and dry.     Capillary Refill: Capillary refill takes less than 2 seconds.     Findings: No ecchymosis, erythema, rash or wound.  Neurological:     General: No focal deficit present.     Mental Status: She is alert and oriented to person, place, and time.     GCS: GCS eye subscore is 4. GCS verbal subscore is 5. GCS motor subscore is 6.     Cranial Nerves: Cranial nerves 2-12 are intact.     Sensory: Sensation is intact.     Motor: Motor function is intact.     Coordination: Coordination is intact.  Psychiatric:        Attention and Perception: Attention normal.        Mood and Affect: Mood normal.        Speech: Speech normal.        Behavior: Behavior normal.     ED Results / Procedures / Treatments    Labs (all labs ordered are listed, but only abnormal results are displayed) Labs Reviewed  BASIC METABOLIC PANEL - Abnormal; Notable for the following components:      Result Value   Glucose, Bld 119 (*)    Creatinine, Ser 1.11 (*)    Calcium 8.8 (*)    GFR, Estimated 53 (*)    All other components within normal limits  CBC  URINALYSIS, ROUTINE W REFLEX MICROSCOPIC    EKG None  Radiology No results found.  Procedures Procedures    Medications Ordered in ED Medications  ondansetron [facility]) injection 4 mg (4 mg Intravenous Given 05/04/23 0642)  HYDROmorphone (DILAUDID) injection 0.5 mg (0.5 mg Intravenous Given 05/04/23 4098)    ED Course/ Medical Decision Making/ A&P                                 Medical Decision Making Amount and/or Complexity of Data Reviewed Labs: ordered. Decision-making details documented in ED Course. Radiology: ordered and independent interpretation performed. Decision-making details documented in ED Course.  Risk Prescription drug management.   Differential Diagnosis considered includes, but not limited to: Renal colic/kidney stone; pyelonephritis; aortic dissection; musculoskeletal pain.  Dents to the emergency department for evaluation of sudden onset right flank pain.  Pain awakens her from sleep.  She is uncomfortable at arrival, having severe pain mostly in the right flank area but some into the lower abdomen.  There is associated nausea and vomiting.  CBC is normal, no leukocytosis.  Basic metabolic panel is unremarkable.  Urinalysis has not been collected yet.  Patient underwent CT renal study to further evaluate.  There is hydronephrosis and hydroureter on the right side.  Official read is pending, will set oncoming ER physician to follow-up on results and disposition patient.        Final Clinical Impression(s) / ED Diagnoses Final diagnoses:  None  Flank Pain  Rx / DC Orders ED Discharge Orders     None          Tiarrah Saville, Canary Brim, MD 05/05/23 [phone]

## 2023-05-04 NOTE — ED Triage Notes (Signed)
Pt c/o right sided flank pain.

## 2023-05-04 NOTE — ED Provider Notes (Signed)
Change of shift care signed out from off going physician Dr. Tommas Olp, patient was updated on her CT scan findings including kidney stone, she has some hydronephrosis but no kidney failure no urinary tract infection and vital signs which are reassuring with a heart rate in the 50s and her blood pressure is normal, she is afebrile, she has been given medications including hydromorphone, ketorolac and is doing well, she still has some pain but understands this may be several days.  Given urology follow-up outpatient.   Eber Hong, MD 05/04/23 1000

## 2023-05-04 NOTE — ED Notes (Signed)
Pt made aware of the need for a urine sample.  Offered toileting, pt asked for some time.  Verbalized understanding of using the call bell when she is ready.

## 2023-05-04 NOTE — Discharge Instructions (Signed)
You do have a kidney stone on the right side, this measures approximately 6 mm, I have prescribed several different medications to help with your symptoms, please feel free to take these medications however if you are developing severe or worsening symptoms return to the emergency department.  I would expect that you will have symptoms for several days so I do want you to follow-up with a urologist and I have given you their phone number above.

## 2023-05-08 ENCOUNTER — Telehealth: Payer: Self-pay | Admitting: Family Medicine

## 2023-05-08 ENCOUNTER — Encounter: Payer: Self-pay | Admitting: Orthopedic Surgery

## 2023-05-08 ENCOUNTER — Encounter (HOSPITAL_COMMUNITY): Payer: Self-pay

## 2023-05-08 ENCOUNTER — Ambulatory Visit: Payer: Medicare HMO | Admitting: Orthopedic Surgery

## 2023-05-08 ENCOUNTER — Ambulatory Visit (HOSPITAL_COMMUNITY)
Admission: RE | Admit: 2023-05-08 | Discharge: 2023-05-08 | Disposition: A | Discharge status: Home / Self Care | Payer: Medicare HMO | Source: Ambulatory Visit | Attending: Family Medicine | Admitting: Family Medicine

## 2023-05-08 VITALS — Ht 65.0 in | Wt 197.0 lb

## 2023-05-08 DIAGNOSIS — Z1231 Encounter for screening mammogram for malignant neoplasm of breast: Secondary | ICD-10-CM | POA: Insufficient documentation

## 2023-05-08 DIAGNOSIS — M7120 Synovial cyst of popliteal space [Baker], unspecified knee: Secondary | ICD-10-CM | POA: Diagnosis not present

## 2023-05-08 NOTE — Telephone Encounter (Signed)
Patient calling wanting to update Dr Lodema Hong that she was seen in ED on Thurs and she has kidney stones- has appt with Urologist 9/19. Also has questions regarding medication that they gave her. Please advise Thank you

## 2023-05-08 NOTE — Progress Notes (Signed)
Follow-up visit  Left knee pain Baker's cyst  Patient not having any pain at this time but still concerned that there is a swelling in the back of the knee she has several questions which were answered  We described the intra-articular versus extra-articular ganglion cyst of tendon sheath in the back of the knee  She is not symptomatic so I advised her not to this time.  Welcome to see her again if she starts having pain

## 2023-05-10 NOTE — Telephone Encounter (Signed)
Lmtrc-kg

## 2023-05-16 DIAGNOSIS — N200 Calculus of kidney: Secondary | ICD-10-CM | POA: Insufficient documentation

## 2023-05-16 NOTE — Progress Notes (Unsigned)
Name: Tanya Rogers DOB: [birth year] MRN: [id number]  History of Present Illness: Ms. Heckle is a 72 y.o. female who presents today as a new patient at [facility] [place]. All available relevant medical records have been reviewed.   She reports concern of kidney stone(s).  She {Actions; denies-reports:120008} prior history of kidney stones. She {Actions; denies-reports:120008} prior history of kidney stone procedure(s) ***including ***ESWL ***ureteroscopic stone manipulation ***[facility].  Recent history: She was seen in the ER on 05/04/2023 for right flank pain with nausea & vomiting. CMP with GFR 53; creatinine 1.11. CBC with no leukocytosis (WBC 6.8). UA negative. CT showed an irregular 6 x 6 mm obstructing distal right ureteral stone approximately 1-2 cm proximal to the right UVJ with resultant moderate-to-severe right hydroureteronephrosis. Also has punctate additional right nephrolithiasis.  She was discharged with prescriptions for Flomax, Zofran, Naproxen, and Percocet 5-325 mg (#*** last dispensed *** per PMP aware controlled substance registry).  Today: She {Actions; denies-reports:120008} stone passage. She {Actions; denies-reports:120008} flank pain / abdominal pain. She states symptoms were first noticed *** and have been ***constant / intermittent / progressively worsening / improving***.  She {Actions; denies-reports:120008} increased urinary urgency, frequency, nocturia, dysuria, gross hematuria, hesitancy, straining to void, or sensations of incomplete emptying.   Fall Screening: Do you usually have a device to assist in your mobility? {yes/no:20286} ***cane / ***walker / ***wheelchair  Medications: Current Outpatient Medications  Medication Sig Dispense Refill   dorzolamide-timolol (COSOPT) 22.3-6.8 MG/ML ophthalmic solution 1 drop 2 (two) times daily.     naproxen (NAPROSYN) 500 MG tablet Take 1 tablet (500 mg total) by mouth 2 (two) times daily  with a meal. 30 tablet 0   ondansetron (ZOFRAN-ODT) 4 MG disintegrating tablet Take 1 tablet (4 mg total) by mouth every 6 (six) hours as needed for nausea. (Patient not taking: Reported on 05/08/2023) 20 tablet 0   oxyCODONE-acetaminophen (PERCOCET) 5-325 MG tablet Take 1 tablet by mouth every 6 (six) hours as needed. (Patient not taking: Reported on 05/08/2023) 12 tablet 0   spironolactone (ALDACTONE) 25 MG tablet Take 1 tablet (25 mg total) by mouth daily. 30 tablet 3   tamsulosin (FLOMAX) 0.4 MG CAPS capsule Take 1 capsule (0.4 mg total) by mouth daily. (Patient not taking: Reported on 05/08/2023) 5 capsule 0   No current facility-administered medications for this visit.    Allergies: No Known Allergies  Past Medical History:  Diagnosis Date   Cataract    Phreesia 02/09/2020   Glaucoma 2003 approx   Glaucoma    Phreesia 02/09/2020   Hypertension    Phreesia 02/09/2020   Obesity    Past Surgical History:  Procedure Laterality Date   COLONOSCOPY N/A 04/15/2008   COLONOSCOPY N/A 06/26/2019   Procedure: COLONOSCOPY;  Surgeon: Malissa Hippo, MD;  Location: [facility];  Service: Endoscopy;  Laterality: N/A;  830   EYE SURGERY Bilateral 12/2012 , 02/2013   [place], cataract   TONSILLECTOMY     TUBAL LIGATION     Family History  Problem Relation Age of Onset   Heart disease Mother    Hypertension Mother    Stroke Mother    Diabetes Mother    Diabetes Father    Hypertension Father    Diabetes Brother    Kidney disease Brother    Diabetes Brother    Diabetes Brother    Cancer Brother        lung    Social History   Socioeconomic History  Marital status: Married    Spouse name: Fayrene Fearing    Number of children: 3   Years of education: 12+   Highest education level: Some college, no degree  Occupational History   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    Social Determinants of Health   Financial Resource Strain: Low Risk  (04/04/2023)   Overall Financial Resource Strain (CARDIA)    Difficulty of Paying Living Expenses: Not hard at all  Food Insecurity: No Food Insecurity (04/04/2023)   Hunger Vital Sign    Worried About Running Out of Food in the Last Year: Never true    Ran Out of Food in the Last Year: Never true  Transportation Needs: No Transportation Needs (04/04/2023)   PRAPARE - Administrator, Civil Service (Medical): No    Lack of Transportation (Non-Medical): No  Physical Activity: Insufficiently Active (04/04/2023)   Exercise Vital Sign    Days of Exercise per Week: 3 days    Minutes of Exercise per Session: 30 min  Stress: No Stress Concern Present (04/04/2023)   [employer] of Occupational Health - Occupational Stress Questionnaire    Feeling of Stress : Not at all  Social Connections: Socially Integrated (04/04/2023)   Social Connection and Isolation Panel [NHANES]    Frequency of Communication with Friends and Family: More than three times a week    Frequency of Social Gatherings with Friends and Family: More than three times a week    Attends Religious Services: More than 4 times per year    Active Member of [employer] or Organizations: Yes    Attends Engineer, structural: More than 4 times per year    Marital Status: Married  Catering manager Violence: Not At Risk (11/14/2022)   Humiliation, Afraid, Rape, and Kick questionnaire    Fear of Current or Ex-Partner: No    Emotionally Abused: No    Physically Abused: No    Sexually Abused: No    SUBJECTIVE  Review of Systems Constitutional: Patient ***denies any unintentional weight loss or change in  strength lntegumentary: Patient ***denies any rashes or pruritus Eyes: Patient denies ***dry eyes ENT: Patient ***denies dry mouth Cardiovascular: Patient ***denies chest pain or syncope Respiratory: Patient ***denies shortness of breath Gastrointestinal: Patient ***denies nausea, vomiting, constipation, or diarrhea Musculoskeletal: Patient ***denies muscle cramps or weakness Neurologic: Patient ***denies convulsions or seizures Psychiatric: Patient ***denies memory problems Allergic/Immunologic: Patient ***denies recent allergic reaction(s) Hematologic/Lymphatic: Patient denies bleeding tendencies Endocrine: Patient ***denies heat/cold intolerance  GU: As per HPI.  OBJECTIVE There were no vitals filed for this visit. There is no height or weight on file to calculate BMI.  Physical Examination Constitutional: ***No obvious distress; patient is ***non-toxic appearing  Cardiovascular: ***No visible lower extremity edema.  Respiratory: The patient does ***not have audible wheezing/stridor; respirations do ***not appear labored  Gastrointestinal: Abdomen ***non-distended Musculoskeletal: ***Normal ROM of UEs  Skin: ***No obvious rashes/open sores  Neurologic: CN 2-12 grossly ***intact Psychiatric: Answered questions ***appropriately with ***normal affect  Hematologic/Lymphatic/Immunologic: ***No obvious bruises or sites of spontaneous bleeding  UA: ***  negative / *** WBC/hpf, *** RBC/hpf, bacteria (***) PVR: *** ml  ASSESSMENT Open-angle glaucoma, unspecified glaucoma stage, unspecified laterality, unspecified open-angle glaucoma type  ***We reviewed recent imaging results; ***  Advised adequate hydration and we discussed option to consider low oxalate diet given that calcium oxalate is the most common type of stone. Handout provided about stone prevention diet.  Will plan to follow up in ***6 months with ***KUB ***[facility] for stone surveillance or sooner if needed. Pt verbalized  understanding and agreement. All questions were answered.   PLAN Advised the following: Maintain adequate fluid intake daily. Drink citrus juice (lemon, lime or orange juice) routinely. Low oxalate diet. ***No follow-ups on file.  No orders of the defined types were placed in this encounter.   It has been explained that the patient is to follow regularly with their PCP in addition to all other providers involved in their care and to follow instructions provided by these respective offices. Patient advised to contact urology clinic if any urologic-pertaining questions, concerns, new symptoms or problems arise in the interim period.  There are no Patient Instructions on file for this visit.  Electronically signed by: Donnita Falls, MSN, FNP-C, CUNP 05/16/2023 12:45 PM

## 2023-05-18 ENCOUNTER — Encounter: Payer: Self-pay | Admitting: Urology

## 2023-05-18 ENCOUNTER — Ambulatory Visit: Payer: Medicare HMO | Admitting: Urology

## 2023-05-18 VITALS — BP 144/81 | HR 67 | Temp 97.9°F

## 2023-05-18 DIAGNOSIS — N201 Calculus of ureter: Secondary | ICD-10-CM | POA: Diagnosis not present

## 2023-05-18 DIAGNOSIS — R109 Unspecified abdominal pain: Secondary | ICD-10-CM

## 2023-05-18 DIAGNOSIS — N1330 Unspecified hydronephrosis: Secondary | ICD-10-CM

## 2023-05-18 DIAGNOSIS — N200 Calculus of kidney: Secondary | ICD-10-CM

## 2023-05-18 NOTE — Patient Instructions (Signed)

## 2023-05-26 ENCOUNTER — Ambulatory Visit (HOSPITAL_COMMUNITY): Payer: Medicare HMO

## 2023-06-02 ENCOUNTER — Ambulatory Visit (HOSPITAL_COMMUNITY)
Admission: RE | Admit: 2023-06-02 | Discharge: 2023-06-02 | Disposition: A | Discharge status: Home / Self Care | Payer: Medicare HMO | Source: Ambulatory Visit | Attending: Urology | Admitting: Urology

## 2023-06-02 DIAGNOSIS — N200 Calculus of kidney: Secondary | ICD-10-CM | POA: Diagnosis present

## 2023-06-07 ENCOUNTER — Encounter: Payer: Self-pay | Admitting: Cardiology

## 2023-06-07 ENCOUNTER — Ambulatory Visit: Payer: Medicare HMO | Attending: Cardiology | Admitting: Cardiology

## 2023-06-07 VITALS — BP 116/66 | HR 50 | Ht 65.0 in | Wt 195.0 lb

## 2023-06-07 DIAGNOSIS — I10 Essential (primary) hypertension: Secondary | ICD-10-CM | POA: Diagnosis not present

## 2023-06-07 DIAGNOSIS — R600 Localized edema: Secondary | ICD-10-CM

## 2023-06-07 MED ORDER — FUROSEMIDE 40 MG PO TABS: 40.0000 mg | ORAL_TABLET | ORAL | 2 refills | Status: DC | PRN

## 2023-06-07 NOTE — Patient Instructions (Addendum)
Medication Instructions:   Begin Lasix 40mg  as needed for swelling  Continue all other medications.     Labwork:  none  Testing/Procedures:  Your physician has requested that you have an echocardiogram. Echocardiography is a painless test that uses sound waves to create images of your heart. It provides your doctor with information about the size and shape of your heart and how well your heart's chambers and valves are working. This procedure takes approximately one hour. There are no restrictions for this procedure. Please do NOT wear cologne, perfume, aftershave, or lotions (deodorant is allowed). Please arrive 15 minutes prior to your appointment time. Office will contact with results via phone, letter or mychart.     Follow-Up:  Pending test results  Any Other Special Instructions Will Be Listed Below (If Applicable).   If you need a refill on your cardiac medications before your next appointment, please call your pharmacy.

## 2023-06-07 NOTE — Progress Notes (Signed)
Clinical Summary Tanya Rogers is a 71 y.o.female last seen in our office 03/2019, seen today as a new patient for the following medical problems.  1.Leg swelling - ongoing for 1 year. Takes lasix about every other day which is brand new - no orthopnea.  - up 5 lbs since 01/2019. Was 197 lbs 10/2018 - limiting sodium intake.   - 03/2019 echo: LVEF 55-60%, no WMAs, normal RV function, normal diastolic function  -seen by vascular. Mild reflux noted, no DVT  - chronic stable LE edema. No significant SOB/DOE. No orthopnea - no longer on lasix, unclear history. She reports swelling had resolved. - limiting sodium intake - remains active with housework, no exertional symptoms.     2. Fatigue - sleepy during the day. Limited to 6 hours of sleep - no snoring, has been told when she lays on her back can stop breathing, occasoinal daytime somnolence but not consistent.   3. HTN - compliant with meds  Past Medical History:  Diagnosis Date   Cataract    Phreesia 02/09/2020   Glaucoma 2003 approx   Glaucoma    Phreesia 02/09/2020   Hypertension    Phreesia 02/09/2020   Obesity      No Known Allergies   Current Outpatient Medications  Medication Sig Dispense Refill   dorzolamide-timolol (COSOPT) 22.3-6.8 MG/ML ophthalmic solution 1 drop 2 (two) times daily.     naproxen (NAPROSYN) 500 MG tablet Take 1 tablet (500 mg total) by mouth 2 (two) times daily with a meal. 30 tablet 0   spironolactone (ALDACTONE) 25 MG tablet Take 1 tablet (25 mg total) by mouth daily. 30 tablet 3   No current facility-administered medications for this visit.     Past Surgical History:  Procedure Laterality Date   COLONOSCOPY N/A 04/15/2008   COLONOSCOPY N/A 06/26/2019   Procedure: COLONOSCOPY;  Surgeon: Malissa Hippo, MD;  Location: [facility];  Service: Endoscopy;  Laterality: N/A;  830   EYE SURGERY Bilateral 12/2012 , 02/2013   [place], cataract   TONSILLECTOMY     TUBAL  LIGATION       No Known Allergies    Family History  Problem Relation Age of Onset   Heart disease Mother    Hypertension Mother    Stroke Mother    Diabetes Mother    Diabetes Father    Hypertension Father    Diabetes Brother    Kidney disease Brother    Diabetes Brother    Diabetes Brother    Cancer Brother        lung      Social History Tanya Rogers reports that she has never smoked. She has never been exposed to tobacco smoke. She has never used smokeless tobacco. Tanya Rogers reports no history of alcohol use.   Review of Systems CONSTITUTIONAL: No weight loss, fever, chills, weakness or fatigue.  HEENT: Eyes: No visual loss, blurred vision, double vision or yellow sclerae.No hearing loss, sneezing, congestion, runny nose or sore throat.  SKIN: No rash or itching.  CARDIOVASCULAR: per hpi RESPIRATORY: No shortness of breath, cough or sputum.  GASTROINTESTINAL: No anorexia, nausea, vomiting or diarrhea. No abdominal pain or blood.  GENITOURINARY: No burning on urination, no polyuria NEUROLOGICAL: No headache, dizziness, syncope, paralysis, ataxia, numbness or tingling in the extremities. No change in bowel or bladder control.  MUSCULOSKELETAL: No muscle, back pain, joint pain or stiffness.  LYMPHATICS: No enlarged nodes. No history of splenectomy.  PSYCHIATRIC: No  history of depression or anxiety.  ENDOCRINOLOGIC: No reports of sweating, cold or heat intolerance. No polyuria or polydipsia.  Marland Kitchen   Physical Examination Today's Vitals   06/07/23 1503  BP: 116/66  Pulse: (!) 50  SpO2: 99%  Weight: 195 lb (88.5 kg)  Height: 5\' 5"  (1.651 m)   Body mass index is 32.45 kg/m.  Gen: resting comfortably, no acute distress HEENT: no scleral icterus, pupils equal round and reactive, no palptable cervical adenopathy,  CV: RRR, no m/rg, no jvd Resp: Clear to auscultation bilaterally GI: abdomen is soft, non-tender, non-distended, normal bowel sounds, no  hepatosplenomegaly MSK: extremities are warm, 1+ bilateral LE edema Skin: warm, no rash Neuro:  no focal deficits Psych: appropriate affect    Assessment and Plan     1. LE edema - obtain echocardiogram to evaluaton for any underlying cardiac dysfunction - start lasix 40mg  prn  2. HTN - at goal, continue current meds.     Antoine Poche, M.D.

## 2023-06-21 ENCOUNTER — Ambulatory Visit: Payer: Medicare HMO | Admitting: Family Medicine

## 2023-07-03 ENCOUNTER — Ambulatory Visit: Payer: Medicare HMO | Attending: Cardiology

## 2023-07-03 DIAGNOSIS — R600 Localized edema: Secondary | ICD-10-CM

## 2023-07-03 LAB — ECHOCARDIOGRAM COMPLETE
AV Mean grad: 5 mm[Hg]
AV Peak grad: 10.8 mm[Hg]
Ao pk vel: 1.64 m/s
Area-P 1/2: 3.87 cm2
Calc EF: 62.3 %
S' Lateral: 2.7 cm
Single Plane A2C EF: 65 %
Single Plane A4C EF: 60.4 %

## 2023-07-18 ENCOUNTER — Encounter: Payer: Self-pay | Admitting: Family Medicine

## 2023-07-18 ENCOUNTER — Ambulatory Visit: Payer: Medicare HMO | Admitting: Family Medicine

## 2023-07-18 ENCOUNTER — Ambulatory Visit (INDEPENDENT_AMBULATORY_CARE_PROVIDER_SITE_OTHER): Payer: Medicare HMO | Admitting: Family Medicine

## 2023-07-18 VITALS — BP 156/85 | HR 67 | Ht 65.0 in | Wt 196.1 lb

## 2023-07-18 DIAGNOSIS — H9311 Tinnitus, right ear: Secondary | ICD-10-CM | POA: Diagnosis not present

## 2023-07-18 DIAGNOSIS — H6121 Impacted cerumen, right ear: Secondary | ICD-10-CM | POA: Diagnosis not present

## 2023-07-18 DIAGNOSIS — I10 Essential (primary) hypertension: Secondary | ICD-10-CM | POA: Diagnosis not present

## 2023-07-18 NOTE — Assessment & Plan Note (Signed)
Ear irrigation performed I recommend using the Debrox earwax removal kit (available over-the-counter) to help clear the excess earwax.

## 2023-07-18 NOTE — Assessment & Plan Note (Signed)
The patient reports that her blood pressure is uncontrolled, acknowledging that she sometimes takes her medication but not consistently. She mentions being prescribed furosemide to manage lower extremity swelling, but has not taken the medication for about a month. The patient shares that ongoing stress related to her daughter's struggle with PTSD has significantly impacted her and contributed to neglecting her own health. Despite this, she is asymptomatic in the clinic today.  The patient expressed a desire to implement lifestyle modifications for a week before making any changes to her treatment regimen. I agreed to allow this, encouraging her to follow a low-sodium diet, increase physical activity, manage her stress levels, and ensure adequate sleep. I also advised her to monitor her blood pressure once daily at home and bring her readings to the next appointment.  We reviewed the long-term considerations of uncontrolled hypertension, including an increased risk of serious cardiovascular conditions such as stroke, coronary artery disease, and heart failure. I emphasized the importance of managing her blood pressure to reduce future health risks.  I also encouraged the patient to seek immediate medical attention if her blood pressure exceeds 180/120 and is accompanied by symptoms such as headaches, chest pain, palpitations, blurred vision, or dizziness. The patient verbalized understanding and is committed to implementing the recommended lifestyle changes.  BP Readings from Last 3 Encounters:  07/18/23 (!) 156/85  06/07/23 116/66  05/18/23 (!) 144/81

## 2023-07-18 NOTE — Progress Notes (Signed)
Established Patient Office Visit  Subjective:  Patient ID: Tanya Rogers, female    DOB: 1951/03/27  Age: 72 y.o. MRN: [id number]  CC:  Chief Complaint  Patient presents with   Hypertension    Pt would like bp check and has been having ringing in ears.     HPI Tanya Rogers is a 72 y.o. female with past medical history of benign essential hypertension, peripheral vascular disease, presents for blood pressure f/u with complaints of unilateral tendinitis. For the details of today's visit, please refer to the assessment and plan.     Past Medical History:  Diagnosis Date   Cataract    Phreesia 02/09/2020   Glaucoma 2003 approx   Glaucoma    Phreesia 02/09/2020   Hypertension    Phreesia 02/09/2020   Obesity     Past Surgical History:  Procedure Laterality Date   COLONOSCOPY N/A 04/15/2008   COLONOSCOPY N/A 06/26/2019   Procedure: COLONOSCOPY;  Surgeon: Malissa Hippo, MD;  Location: [facility];  Service: Endoscopy;  Laterality: N/A;  830   EYE SURGERY Bilateral 12/2012 , 02/2013   [place], cataract   TONSILLECTOMY     TUBAL LIGATION      Family History  Problem Relation Age of Onset   Heart disease Mother    Hypertension Mother    Stroke Mother    Diabetes Mother    Diabetes Father    Hypertension Father    Diabetes Brother    Kidney disease Brother    Diabetes Brother    Diabetes Brother    Cancer Brother        lung     Social History   Socioeconomic History   Marital status: Married    Spouse name: Fayrene Fearing    Number of children: 3   Years of education: 12+   Highest education level: Some college, no degree  Occupational History   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    Social Determinants of Health   Financial Resource Strain: Low Risk  (04/04/2023)   Overall Financial Resource Strain (CARDIA)    Difficulty of Paying Living Expenses: Not hard at all  Food Insecurity: No Food Insecurity (04/04/2023)   Hunger Vital Sign    Worried About Running Out of Food in the Last Year: Never true    Ran Out of Food in the Last Year: Never true  Transportation Needs: No Transportation Needs (04/04/2023)   PRAPARE - Administrator, Civil Service (Medical): No    Lack of Transportation (Non-Medical): No  Physical Activity: Insufficiently Active (04/04/2023)   Exercise Vital Sign    Days of Exercise per Week: 3 days    Minutes of Exercise per Session: 30 min  Stress: No Stress Concern Present (04/04/2023)   [employer] of Occupational Health - Occupational Stress Questionnaire    Feeling of Stress : Not at all  Social Connections: Socially Integrated (04/04/2023)   Social Connection and Isolation Panel [NHANES]    Frequency of Communication with Friends and Family: More than three  times a week    Frequency of Social Gatherings with Friends and Family: More than three times a week    Attends Religious Services: More than 4 times per year    Active Member of [employer] or Organizations: Yes    Attends Engineer, structural: More than 4 times per year    Marital Status: Married  Catering manager Violence: Not At Risk (11/14/2022)   Humiliation, Afraid, Rape, and Kick questionnaire    Fear of Current or Ex-Partner: No    Emotionally Abused: No    Physically Abused: No    Sexually Abused: No    Outpatient Medications Prior to Visit  Medication Sig Dispense Refill   dorzolamide-timolol (COSOPT) 22.3-6.8 MG/ML ophthalmic solution 1 drop 2 (two) times daily.     furosemide (LASIX) 40 MG tablet Take 1 tablet (40 mg total) by mouth as needed for edema (swelling). 30 tablet 2    spironolactone (ALDACTONE) 25 MG tablet Take 1 tablet (25 mg total) by mouth daily. 30 tablet 3   No facility-administered medications prior to visit.    No Known Allergies  ROS Review of Systems  Constitutional:  Negative for chills and fever.  HENT:  Positive for tinnitus (right).   Eyes:  Negative for visual disturbance.  Respiratory:  Negative for chest tightness and shortness of breath.   Neurological:  Negative for dizziness and headaches.      Objective:    Physical Exam HENT:     Head: Normocephalic.     Right Ear: There is impacted cerumen.     Left Ear: There is no impacted cerumen.     Mouth/Throat:     Mouth: Mucous membranes are moist.  Cardiovascular:     Rate and Rhythm: Normal rate.     Heart sounds: Normal heart sounds.  Pulmonary:     Effort: Pulmonary effort is normal.     Breath sounds: Normal breath sounds.  Neurological:     Mental Status: She is alert.     BP (!) 156/85 (BP Location: Left Arm)   Pulse 67   Ht 5\' 5"  (1.651 m)   Wt 196 lb 1.9 oz (89 kg)   SpO2 98%   BMI 32.64 kg/m  Wt Readings from Last 3 Encounters:  07/18/23 196 lb 1.9 oz (89 kg)  06/07/23 195 lb (88.5 kg)  05/08/23 197 lb (89.4 kg)    Lab Results  Component Value Date   TSH 2.000 08/09/2022   Lab Results  Component Value Date   WBC 6.8 05/04/2023   HGB 12.8 05/04/2023   HCT 38.5 05/04/2023   MCV 92.3 05/04/2023   PLT 165 05/04/2023   Lab Results  Component Value Date   NA 139 05/04/2023   K 4.1 05/04/2023   CO2 24 05/04/2023   GLUCOSE 119 (H) 05/04/2023   BUN 23 05/04/2023   CREATININE 1.11 (H) 05/04/2023   BILITOT 0.4 08/09/2022   ALKPHOS 85 08/09/2022   AST 21 08/09/2022   ALT 17 08/09/2022   PROT 6.9 08/09/2022   ALBUMIN 3.9 08/09/2022   CALCIUM 8.8 (L) 05/04/2023   ANIONGAP 11 05/04/2023   EGFR 61 02/15/2023   Lab Results  Component Value Date   CHOL 121 08/09/2022   Lab Results  Component Value Date   HDL 53 08/09/2022   Lab  Results  Component Value Date   LDLCALC 58 08/09/2022   Lab Results  Component Value Date   TRIG 39 08/09/2022   Lab Results  Component Value Date   CHOLHDL 2.3 08/09/2022   Lab Results  Component Value Date   HGBA1C 5.6 02/15/2023      Assessment & Plan:  Benign essential HTN Assessment & Plan: The patient reports that her blood pressure is uncontrolled, acknowledging that she sometimes takes her medication but not consistently. She mentions being prescribed furosemide to manage lower extremity swelling, but has not taken the medication for about a month. The patient shares that ongoing stress related to her daughter's struggle with PTSD has significantly impacted her and contributed to neglecting her own health. Despite this, she is asymptomatic in the clinic today.  The patient expressed a desire to implement lifestyle modifications for a week before making any changes to her treatment regimen. I agreed to allow this, encouraging her to follow a low-sodium diet, increase physical activity, manage her stress levels, and ensure adequate sleep. I also advised her to monitor her blood pressure once daily at home and bring her readings to the next appointment.  We reviewed the long-term considerations of uncontrolled hypertension, including an increased risk of serious cardiovascular conditions such as stroke, coronary artery disease, and heart failure. I emphasized the importance of managing her blood pressure to reduce future health risks.  I also encouraged the patient to seek immediate medical attention if her blood pressure exceeds 180/120 and is accompanied by symptoms such as headaches, chest pain, palpitations, blurred vision, or dizziness. The patient verbalized understanding and is committed to implementing the recommended lifestyle changes.  BP Readings from Last 3 Encounters:  07/18/23 (!) 156/85  06/07/23 116/66  05/18/23 (!) 144/81      Tinnitus of right  ear Assessment & Plan: The patient reports a buzzing sensation in the right ear, likely due to a buildup of earwax. She notes that the buzzing occurs only when lying on the affected side at night and subsides when she sits upright. I recommend using the Debrox earwax removal kit (available over-the-counter) to help clear the excess earwax.  The patient is encouraged to follow up in one week to reassess her symptoms. If the buzzing sensation persists, we may consider a referral to an ENT specialist for further evaluation.  Additionally, given the potential link between vitamin B12 deficiency and tinnitus, I recommend stopping by the lab for a vitamin B12 test. A deficiency in vitamin B12 is also associated with tendonitis and other neurological symptoms.   Orders: -     Vitamin B12  Impacted cerumen of right ear Assessment & Plan: Ear irrigation performed I recommend using the Debrox earwax removal kit (available over-the-counter) to help clear the excess earwax.    Note: This chart has been completed using Engineer, civil (consulting) software, and while attempts have been made to ensure accuracy, certain words and phrases may not be transcribed as intended.    Follow-up: Return in about 1 week (around 07/25/2023) for BP and tinnitis.   Gilmore Laroche, FNP

## 2023-07-18 NOTE — Patient Instructions (Addendum)
I appreciate the opportunity to provide care to you today!    Follow up:  1 week with PCP or Me  Labs: please stop by the lab today to get your blood drawn (Vitamin B12)   Tinnitus of the Right Ear The patient reports a buzzing sensation in the right ear, which is likely due to a buildup of earwax. I recommend using the Debrox earwax removal kit (available over-the-counter) to help remove the excess earwax from the ear.  Follow-up: Please follow up with me in one week to reassess your symptoms. If the buzzing sensation persists, we may consider a referral to an ENT specialist for further evaluation.  Vitamin B12 Deficiency: Given the potential link between vitamin B12 deficiency and tinnitus, I recommend stopping by the lab for a vitamin B12 test. A deficiency in vitamin B12 is also associated with tendonitis and other neurological symptoms.     Hypertension Management  Your current blood pressure is above the target goal of <140/90 mmHg. To address this, I have agreed to allow you to focus on lifestyle changes for the next week. Please be diligent in adhering to a low-sodium diet and increasing your physical activity as we discussed.  Diet and Lifestyle: Adhere to a low-sodium diet, limiting your intake to less than 1500 mg daily. Also, aim to increase your physical activity as discussed in our session. Avoid over-the-counter NSAIDs (e.g., ibuprofen and naproxen) while on your current medication regimen.  Hydration and Nutrition: Stay well-hydrated by drinking at least 64 ounces of water daily. Focus on increasing your servings of fruits and vegetables, and be mindful to avoid excessive sodium in your meals.  Long-Term Considerations: Uncontrolled hypertension can significantly increase the risk of serious cardiovascular conditions, such as stroke, coronary artery disease, and heart failure. It is important to take these steps seriously to manage your blood pressure and reduce future  health risks.  Please report to the emergency department if your blood pressure exceeds 180/120 and is accompanied by symptoms such as headaches, chest pain, palpitations, blurred vision, or dizziness.    Attached with your AVS, you will find valuable resources for self-education. I highly recommend dedicating some time to thoroughly examine them.   Please continue to a heart-healthy diet and increase your physical activities. Try to exercise for at least five days a week.    It was a pleasure to see you and I look forward to continuing to work together on your health and well-being. Please do not hesitate to call the office if you need care or have questions about your care.  In case of emergency, please visit the Emergency Department for urgent care, or contact our clinic at [phone] to schedule an appointment. We're here to help you!   Have a wonderful day and week. With Gratitude, Gilmore Laroche MSN, FNP-BC

## 2023-07-18 NOTE — Assessment & Plan Note (Addendum)
The patient reports a buzzing sensation in the right ear, likely due to a buildup of earwax. She notes that the buzzing occurs only when lying on the affected side at night and subsides when she sits upright. I recommend using the Debrox earwax removal kit (available over-the-counter) to help clear the excess earwax.  The patient is encouraged to follow up in one week to reassess her symptoms. If the buzzing sensation persists, we may consider a referral to an ENT specialist for further evaluation.  Additionally, given the potential link between vitamin B12 deficiency and tinnitus, I recommend stopping by the lab for a vitamin B12 test. A deficiency in vitamin B12 is also associated with tendonitis and other neurological symptoms.

## 2023-07-19 LAB — VITAMIN B12: Vitamin B-12: 839 pg/mL (ref 232–1245)

## 2023-07-21 ENCOUNTER — Ambulatory Visit: Payer: Medicare HMO | Admitting: Family Medicine

## 2023-07-25 ENCOUNTER — Ambulatory Visit: Payer: Medicare HMO | Admitting: Family Medicine

## 2023-07-25 ENCOUNTER — Telehealth: Payer: Self-pay

## 2023-07-25 NOTE — Telephone Encounter (Signed)
Copied from CRM [id number]. Topic: Appointments - Appointment Scheduling >> Jul 24, 2023 10:39 AM Fonda Kinder J wrote: Patient/patient representative is calling to schedule an appointment. Refer to attachments for appointment information.

## 2023-07-25 NOTE — Telephone Encounter (Signed)
Pt scheduled on 07/31/23

## 2023-07-31 ENCOUNTER — Ambulatory Visit (INDEPENDENT_AMBULATORY_CARE_PROVIDER_SITE_OTHER): Payer: Medicare HMO | Admitting: Family Medicine

## 2023-07-31 VITALS — BP 138/84 | HR 63 | Ht 65.0 in | Wt 198.1 lb

## 2023-07-31 DIAGNOSIS — H9311 Tinnitus, right ear: Secondary | ICD-10-CM | POA: Diagnosis not present

## 2023-07-31 DIAGNOSIS — I10 Essential (primary) hypertension: Secondary | ICD-10-CM | POA: Diagnosis not present

## 2023-07-31 NOTE — Patient Instructions (Signed)
I appreciate the opportunity to provide care to you today!    Follow up:  3 months with pcp    Attached with your AVS, you will find valuable resources for self-education. I highly recommend dedicating some time to thoroughly examine them.   Please continue to a heart-healthy diet and increase your physical activities. Try to exercise for at least five days a week.    It was a pleasure to see you and I look forward to continuing to work together on your health and well-being. Please do not hesitate to call the office if you need care or have questions about your care.  In case of emergency, please visit the Emergency Department for urgent care, or contact our clinic at [phone] to schedule an appointment. We're here to help you!   Have a wonderful day and week. With Gratitude, Gilmore Laroche MSN, FNP-BC

## 2023-07-31 NOTE — Assessment & Plan Note (Signed)
Resolved No complaints or concerns voiced

## 2023-07-31 NOTE — Progress Notes (Signed)
Established Patient Office Visit  Subjective:  Patient ID: Tanya Rogers, female    DOB: [birth year]  Age: 72 y.o. MRN: [id number]  CC:  Chief Complaint  Patient presents with   Care Management    Follow up    HPI Tanya Rogers is a 72 y.o. female presents for hypertension tendinitis follow-up/ For the details of today's visit, please refer to the assessment and plan.    Past Medical History:  Diagnosis Date   Cataract    Phreesia 02/09/2020   Glaucoma 2003 approx   Glaucoma    Phreesia 02/09/2020   Hypertension    Phreesia 02/09/2020   Obesity     Past Surgical History:  Procedure Laterality Date   COLONOSCOPY N/A 04/15/2008   COLONOSCOPY N/A 06/26/2019   Procedure: COLONOSCOPY;  Surgeon: Malissa Hippo, MD;  Location: [facility];  Service: Endoscopy;  Laterality: N/A;  830   EYE SURGERY Bilateral 12/2012 , 02/2013   [place], cataract   TONSILLECTOMY     TUBAL LIGATION      Family History  Problem Relation Age of Onset   Heart disease Mother    Hypertension Mother    Stroke Mother    Diabetes Mother    Diabetes Father    Hypertension Father    Diabetes Brother    Kidney disease Brother    Diabetes Brother    Diabetes Brother    Cancer Brother        lung     Social History   Socioeconomic History   Marital status: Married    Spouse name: Fayrene Fearing    Number of children: 3   Years of education: 12+   Highest education level: Some college, no degree  Occupational History   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    Social Determinants of Health   Financial Resource Strain: Low Risk   (07/31/2023)   Overall Financial Resource Strain (CARDIA)    Difficulty of Paying Living Expenses: Not hard at all  Food Insecurity: No Food Insecurity (07/31/2023)   Hunger Vital Sign    Worried About Running Out of Food in the Last Year: Never true    Ran Out of Food in the Last Year: Never true  Transportation Needs: No Transportation Needs (07/31/2023)   PRAPARE - Administrator, Civil Service (Medical): No    Lack of Transportation (Non-Medical): No  Physical Activity: Insufficiently Active (07/31/2023)   Exercise Vital Sign    Days of Exercise per Week: 1 day    Minutes of Exercise per Session: 30 min  Stress: No Stress Concern Present (07/31/2023)   [employer] of Occupational Health - Occupational Stress Questionnaire    Feeling of Stress : Not at all  Social Connections: Socially Integrated (07/31/2023)   Social Connection and Isolation Panel [NHANES]    Frequency of Communication with Friends and Family: More than three times a week    Frequency of Social Gatherings with Friends and Family: More than three times a week    Attends Religious Services: More  than 4 times per year    Active Member of Clubs or Organizations: Yes    Attends Banker Meetings: More than 4 times per year    Marital Status: Married  Catering manager Violence: Not At Risk (11/14/2022)   Humiliation, Afraid, Rape, and Kick questionnaire    Fear of Current or Ex-Partner: No    Emotionally Abused: No    Physically Abused: No    Sexually Abused: No    Outpatient Medications Prior to Visit  Medication Sig Dispense Refill   dorzolamide-timolol (COSOPT) 22.3-6.8 MG/ML ophthalmic solution 1 drop 2 (two) times daily.     furosemide (LASIX) 40 MG tablet Take 1 tablet (40 mg total) by mouth as needed for edema (swelling). 30 tablet 2   spironolactone (ALDACTONE) 25 MG tablet Take 1 tablet (25 mg total) by mouth daily. 30 tablet 3   No facility-administered medications prior to visit.     No Known Allergies  ROS Review of Systems  Constitutional:  Negative for chills and fever.  Eyes:  Negative for visual disturbance.  Respiratory:  Negative for chest tightness and shortness of breath.   Neurological:  Negative for dizziness and headaches.      Objective:    Physical Exam HENT:     Head: Normocephalic.     Mouth/Throat:     Mouth: Mucous membranes are moist.  Cardiovascular:     Rate and Rhythm: Normal rate.     Heart sounds: Normal heart sounds.  Pulmonary:     Effort: Pulmonary effort is normal.     Breath sounds: Normal breath sounds.  Neurological:     Mental Status: She is alert.     BP 138/84   Pulse 63   Ht 5\' 5"  (1.651 m)   Wt 198 lb 1.3 oz (89.8 kg)   SpO2 96%   BMI 32.96 kg/m  Wt Readings from Last 3 Encounters:  07/31/23 198 lb 1.3 oz (89.8 kg)  07/18/23 196 lb 1.9 oz (89 kg)  06/07/23 195 lb (88.5 kg)    Lab Results  Component Value Date   TSH 2.000 08/09/2022   Lab Results  Component Value Date   WBC 6.8 05/04/2023   HGB 12.8 05/04/2023   HCT 38.5 05/04/2023   MCV 92.3 05/04/2023   PLT 165 05/04/2023   Lab Results  Component Value Date   NA 139 05/04/2023   K 4.1 05/04/2023   CO2 24 05/04/2023   GLUCOSE 119 (H) 05/04/2023   BUN 23 05/04/2023   CREATININE 1.11 (H) 05/04/2023   BILITOT 0.4 08/09/2022   ALKPHOS 85 08/09/2022   AST 21 08/09/2022   ALT 17 08/09/2022   PROT 6.9 08/09/2022   ALBUMIN 3.9 08/09/2022   CALCIUM 8.8 (L) 05/04/2023   ANIONGAP 11 05/04/2023   EGFR 61 02/15/2023   Lab Results  Component Value Date   CHOL 121 08/09/2022   Lab Results  Component Value Date   HDL 53 08/09/2022   Lab Results  Component Value Date   LDLCALC 58 08/09/2022   Lab Results  Component Value Date   TRIG 39 08/09/2022   Lab Results  Component Value Date   CHOLHDL 2.3 08/09/2022   Lab Results  Component Value Date   HGBA1C 5.6 02/15/2023      Assessment & Plan:  Benign essential HTN Assessment &  Plan: The patient's blood pressure is well-controlled during today's clinic visit. I encouraged the patient to continue her current treatment regimen, adhere to  a low-sodium diet, and increase physical activity. Additionally, I reviewed the DASH (Dietary Approaches to Stop Hypertension) diet as part of their management plan. I also informed the patient to seek immediate medical attention if her blood pressure exceeds 180/120 mmHg, particularly if accompanied by symptoms such as headaches, chest pain, palpitations, blurred vision, or dizziness. The patient verbalized understanding of the recommendations and expressed a commitment to implementing the suggested lifestyle changes. BP Readings from Last 3 Encounters:  07/31/23 138/84  07/18/23 (!) 156/85  06/07/23 116/66      Tinnitus of right ear Assessment & Plan: Resolved No complaints or concerns voiced    Note: This chart has been completed using Dragon Medical Dictation software, and while attempts have been made to ensure accuracy, certain words and phrases may not be transcribed as intended.    Follow-up: Return in about 3 months (around 10/29/2023).   Gilmore Laroche, FNP

## 2023-07-31 NOTE — Assessment & Plan Note (Signed)
The patient's blood pressure is well-controlled during today's clinic visit. I encouraged the patient to continue her current treatment regimen, adhere to a low-sodium diet, and increase physical activity. Additionally, I reviewed the DASH (Dietary Approaches to Stop Hypertension) diet as part of their management plan. I also informed the patient to seek immediate medical attention if her blood pressure exceeds 180/120 mmHg, particularly if accompanied by symptoms such as headaches, chest pain, palpitations, blurred vision, or dizziness. The patient verbalized understanding of the recommendations and expressed a commitment to implementing the suggested lifestyle changes. BP Readings from Last 3 Encounters:  07/31/23 138/84  07/18/23 (!) 156/85  06/07/23 116/66

## 2023-08-15 ENCOUNTER — Encounter: Payer: Self-pay | Admitting: Family Medicine

## 2023-08-15 ENCOUNTER — Ambulatory Visit (INDEPENDENT_AMBULATORY_CARE_PROVIDER_SITE_OTHER): Payer: Medicare HMO | Admitting: Family Medicine

## 2023-08-15 VITALS — BP 144/84 | HR 130 | Ht 65.0 in | Wt 198.0 lb

## 2023-08-15 DIAGNOSIS — I10 Essential (primary) hypertension: Secondary | ICD-10-CM | POA: Diagnosis not present

## 2023-08-15 DIAGNOSIS — Z0001 Encounter for general adult medical examination with abnormal findings: Secondary | ICD-10-CM | POA: Diagnosis not present

## 2023-08-15 MED ORDER — SPIRONOLACTONE 25 MG PO TABS: 25.0000 mg | ORAL_TABLET | Freq: Every day | ORAL | 3 refills | Status: DC

## 2023-08-15 NOTE — Progress Notes (Unsigned)
    Tanya Rogers     MRN: [id number]      DOB: 1950/09/16  Chief Complaint  Patient presents with   Annual Exam    CPE     HPI: Patient is in for annual physical exam. Uncontrolled hypertension due to med non compliance and stress due to mental illness untreated in adult daughter are both addressed.  Immunization is reviewed , and  will be updated    PE:  BP (!) 144/84   Pulse (!) 130   Ht 5\' 5"  (1.651 m)   Wt 198 lb 0.6 oz (89.8 kg)   SpO2 96%   BMI 32.96 kg/m   Pleasant  female, alert and oriented x 3, in no cardio-pulmonary distress. Afebrile. HEENT No facial trauma or asymetry. Sinuses non tender.  Extra occullar muscles intact.. External ears normal, . Neck: supple, no adenopathy,JVD or thyromegaly.No bruits.  Chest: Clear to ascultation bilaterally.No crackles or wheezes. Non tender to palpation  Cardiovascular system; Heart sounds normal,  S1 and  S2 ,no S3.  No murmur, or thrill. .  Abdomen: Soft, non tender, no organomegaly or masses. .   Musculoskeletal exam: Full ROM of spine, hips , shoulders and knees. No deformity ,swelling or crepitus noted. No muscle wasting or atrophy.   Neurologic: Cranial nerves 2 to 12 intact. Power, tone ,sensation and reflexes normal throughout. No disturbance in gait. No tremor.  Skin: Intact, no ulceration, erythema , scaling or rash noted. Pigmentation normal throughout  Psych; Normal mood and affect. Judgement and concentration normal   Assessment & Plan:  Encounter for Medicare annual examination with abnormal findings Annual exam as documented. . Immunization and cancer screening needs are specifically addressed at this visit.   Benign essential HTN Uncontrolled due to non compliance needs to start taking med as prescribed

## 2023-08-15 NOTE — Patient Instructions (Signed)
F/U in 8 to 10 weeks , re evaluate blood pressure, call if you need me sooner  New for blood pressure , please commit to once dailyspironolactone  Settle on 3 SMALL changes to handle stresss in life  It is important that you exercise regularly at least 30 minutes 5 times a week. If you develop chest pain, have severe difficulty breathing, or feel very tired, stop exercising immediately and seek medical attention   Best wishes for 2025 to you and your family  Thanks for choosing [facility], we consider it a privelige to serve you.

## 2023-08-16 DIAGNOSIS — Z0001 Encounter for general adult medical examination with abnormal findings: Secondary | ICD-10-CM | POA: Insufficient documentation

## 2023-08-16 NOTE — Assessment & Plan Note (Signed)
Annual exam as documented. . Immunization and cancer screening needs are specifically addressed at this visit.  

## 2023-08-16 NOTE — Assessment & Plan Note (Signed)
Uncontrolled due to non compliance needs to start taking med as prescribed

## 2023-08-18 ENCOUNTER — Telehealth: Payer: Self-pay | Admitting: Cardiology

## 2023-08-18 NOTE — Telephone Encounter (Signed)
 Pt is returning call to nurse for results

## 2023-08-18 NOTE — Telephone Encounter (Signed)
Patient informed and verbalized understanding of plan. For echo results

## 2023-10-18 ENCOUNTER — Ambulatory Visit: Payer: Medicare HMO | Admitting: Family Medicine

## 2023-10-24 ENCOUNTER — Ambulatory Visit (INDEPENDENT_AMBULATORY_CARE_PROVIDER_SITE_OTHER): Payer: Medicare HMO | Admitting: Family Medicine

## 2023-10-24 VITALS — BP 128/78 | HR 61 | Ht 65.0 in | Wt 201.0 lb

## 2023-10-24 DIAGNOSIS — E66811 Obesity, class 1: Secondary | ICD-10-CM

## 2023-10-24 DIAGNOSIS — Z1231 Encounter for screening mammogram for malignant neoplasm of breast: Secondary | ICD-10-CM

## 2023-10-24 DIAGNOSIS — I10 Essential (primary) hypertension: Secondary | ICD-10-CM

## 2023-10-24 DIAGNOSIS — R2242 Localized swelling, mass and lump, left lower limb: Secondary | ICD-10-CM | POA: Diagnosis not present

## 2023-10-24 DIAGNOSIS — R7303 Prediabetes: Secondary | ICD-10-CM

## 2023-10-24 MED ORDER — SPIRONOLACTONE 25 MG PO TABS: 25.0000 mg | ORAL_TABLET | Freq: Every day | ORAL | 3 refills | Status: AC

## 2023-10-24 NOTE — Progress Notes (Signed)
 Tanya Rogers     MRN: [id number]      DOB: 08/20/1951  Chief Complaint  Patient presents with   Follow-up    8-10 week    HPI Tanya Rogers is here for follow up and re-evaluation of chronic medical conditions, medication management and review of any available recent lab and radiology data.  Preventive health is updated, specifically  Cancer screening and Immunization.    The PT denies any adverse reactions to current medications since the last visit.  C/o recurrent dark spot on right leg with indentation   ROS Denies recent fever or chills. Denies sinus pressure, nasal congestion, ear pain or sore throat. Denies chest congestion, productive cough or wheezing. Denies chest pains, palpitations and leg swelling Denies abdominal pain, nausea, vomiting,diarrhea or constipation.   Denies dysuria, frequency, hesitancy or incontinence. Denies joint pain, swelling and limitation in mobility. Denies headaches, seizures, numbness, or tingling. Denies depression, anxiety or insomnia.  PE  BP 128/78   Pulse 61   Ht 5\' 5"  (1.651 m)   Wt 201 lb 0.6 oz (91.2 kg)   SpO2 96%   BMI 33.45 kg/m   Patient alert and oriented and in no cardiopulmonary distress.  HEENT: No facial asymmetry, EOMI,     Neck supple .  Chest: Clear to auscultation bilaterally.  CVS: S1, S2 no murmurs, no S3.Regular rate.  ABD: Soft non tender.   Ext: trace bilateral  edema  MS: Adequate ROM spine, shoulders, hips and knees.  Skin: Intact, hyperpigmentation of distal right leg, superficial varicose veins present Psych: Good eye contact, normal affect. Memory intact not anxious or depressed appearing.  CNS: CN 2-12 intact, power,  normal throughout.no focal deficits noted.   Assessment & Plan  Benign essential HTN Controlled, no change in medication DASH diet and commitment to daily physical activity for a minimum of 30 minutes discussed and encouraged, as a part of hypertension management. The  importance of attaining a healthy weight is also discussed.     10/24/2023    1:01 PM 08/16/2023    7:22 AM 08/15/2023    1:11 PM 07/31/2023    8:42 AM 07/31/2023    8:41 AM 07/18/2023    9:36 AM 07/18/2023    9:28 AM  BP/Weight  Systolic BP 128 144 132 138 148 156 170  Diastolic BP 78 84 76 84 81 85 91  Wt. (Lbs) 201.04  198.04  198.08  196.12  BMI 33.45 kg/m2  32.96 kg/m2  32.96 kg/m2  32.64 kg/m2       Obesity (BMI 30.0-34.9)  Patient re-educated about  the importance of commitment to a  minimum of 150 minutes of exercise per week as able.  The importance of healthy food choices with portion control discussed, as well as eating regularly and within a 12 hour window most days. The need to choose "clean , green" food 50 to 75% of the time is discussed, as well as to make water the primary drink and set a goal of 64 ounces water daily.       10/24/2023    1:01 PM 08/15/2023    1:11 PM 07/31/2023    8:41 AM  Weight /BMI  Weight 201 lb 0.6 oz 198 lb 0.6 oz 198 lb 1.3 oz  Height 5\' 5"  (1.651 m) 5\' 5"  (1.651 m) 5\' 5"  (1.651 m)  BMI 33.45 kg/m2 32.96 kg/m2 32.96 kg/m2    deteriorated  Prediabetes Patient educated about the importance of limiting  Carbohydrate intake , the need to commit to daily physical activity for a minimum of 30 minutes , and to commit weight loss. The fact that changes in all these areas will reduce or eliminate all together the development of diabetes is stressed.      Latest Ref Rng & Units 05/04/2023    6:38 AM 02/15/2023    3:18 PM 08/09/2022    2:02 PM 03/26/2021   12:16 PM 11/26/2020    3:27 PM  Diabetic Labs  HbA1c 4.8 - 5.6 %  5.6    5.7    5.7   Chol 100 - 199 mg/dL   962  952    HDL >84 mg/dL   53  48    Calc LDL 0 - 99 mg/dL   58  58    Triglycerides 0 - 149 mg/dL   39  52    Creatinine 0.44 - 1.00 mg/dL 1.32  4.40  1.02  7.25        10/24/2023    1:01 PM 08/16/2023    7:22 AM 08/15/2023    1:11 PM 07/31/2023    8:42 AM 07/31/2023     8:41 AM 07/18/2023    9:36 AM 07/18/2023    9:28 AM  BP/Weight  Systolic BP 128 144 132 138 148 156 170  Diastolic BP 78 84 76 84 81 85 91  Wt. (Lbs) 201.04  198.04  198.08  196.12  BMI 33.45 kg/m2  32.96 kg/m2  32.96 kg/m2  32.64 kg/m2       No data to display          Updated lab needed at/ before next visit.   Localized swelling of left lower leg Intermittent flares with hyperpigmentation of area of concern refer vacular for eval for PVD

## 2023-10-24 NOTE — Assessment & Plan Note (Signed)
 Patient educated about the importance of limiting  Carbohydrate intake , the need to commit to daily physical activity for a minimum of 30 minutes , and to commit weight loss. The fact that changes in all these areas will reduce or eliminate all together the development of diabetes is stressed.      Latest Ref Rng & Units 05/04/2023    6:38 AM 02/15/2023    3:18 PM 08/09/2022    2:02 PM 03/26/2021   12:16 PM 11/26/2020    3:27 PM  Diabetic Labs  HbA1c 4.8 - 5.6 %  5.6    5.7    5.7   Chol 100 - 199 mg/dL   981  191    HDL >47 mg/dL   53  48    Calc LDL 0 - 99 mg/dL   58  58    Triglycerides 0 - 149 mg/dL   39  52    Creatinine 0.44 - 1.00 mg/dL 8.29  5.62  1.30  8.65        10/24/2023    1:01 PM 08/16/2023    7:22 AM 08/15/2023    1:11 PM 07/31/2023    8:42 AM 07/31/2023    8:41 AM 07/18/2023    9:36 AM 07/18/2023    9:28 AM  BP/Weight  Systolic BP 128 144 132 138 148 156 170  Diastolic BP 78 84 76 84 81 85 91  Wt. (Lbs) 201.04  198.04  198.08  196.12  BMI 33.45 kg/m2  32.96 kg/m2  32.96 kg/m2  32.64 kg/m2       No data to display          Updated lab needed at/ before next visit.

## 2023-10-24 NOTE — Assessment & Plan Note (Signed)
 Controlled, no change in medication DASH diet and commitment to daily physical activity for a minimum of 30 minutes discussed and encouraged, as a part of hypertension management. The importance of attaining a healthy weight is also discussed.     10/24/2023    1:01 PM 08/16/2023    7:22 AM 08/15/2023    1:11 PM 07/31/2023    8:42 AM 07/31/2023    8:41 AM 07/18/2023    9:36 AM 07/18/2023    9:28 AM  BP/Weight  Systolic BP 128 144 132 138 148 156 170  Diastolic BP 78 84 76 84 81 85 91  Wt. (Lbs) 201.04  198.04  198.08  196.12  BMI 33.45 kg/m2  32.96 kg/m2  32.96 kg/m2  32.64 kg/m2

## 2023-10-24 NOTE — Assessment & Plan Note (Signed)
  Patient re-educated about  the importance of commitment to a  minimum of 150 minutes of exercise per week as able.  The importance of healthy food choices with portion control discussed, as well as eating regularly and within a 12 hour window most days. The need to choose "clean , green" food 50 to 75% of the time is discussed, as well as to make water the primary drink and set a goal of 64 ounces water daily.       10/24/2023    1:01 PM 08/15/2023    1:11 PM 07/31/2023    8:41 AM  Weight /BMI  Weight 201 lb 0.6 oz 198 lb 0.6 oz 198 lb 1.3 oz  Height 5\' 5"  (1.651 m) 5\' 5"  (1.651 m) 5\' 5"  (1.651 m)  BMI 33.45 kg/m2 32.96 kg/m2 32.96 kg/m2    deteriorated

## 2023-10-24 NOTE — Assessment & Plan Note (Signed)
 Intermittent flares with hyperpigmentation of area of concern refer vacular for eval for PVD

## 2023-10-24 NOTE — Patient Instructions (Addendum)
 F/U end September, call if you need me sooner  Please schedule mammogram at checkout   Labs today  You are being referred to Vascular for evaluation for PVD/ varicose veins, left leg pain intermittently x years   It is important that you exercise regularly at least 30 minutes 5 times a week. If you develop chest pain, have severe difficulty breathing, or feel very tired, stop exercising immediately and seek medical attention   10 pound weight loss challenge is on!!  ,Thanks for choosing [facility], we consider it a privelige to serve you.

## 2023-10-25 ENCOUNTER — Encounter: Payer: Self-pay | Admitting: Family Medicine

## 2023-10-25 LAB — CBC WITH DIFFERENTIAL/PLATELET
Basophils Absolute: 0 10*3/uL (ref 0.0–0.2)
Basos: 0 %
EOS (ABSOLUTE): 0.1 10*3/uL (ref 0.0–0.4)
Eos: 2 %
Hematocrit: 39.2 % (ref 34.0–46.6)
Hemoglobin: 12.9 g/dL (ref 11.1–15.9)
Immature Grans (Abs): 0 10*3/uL (ref 0.0–0.1)
Immature Granulocytes: 0 %
Lymphocytes Absolute: 2.3 10*3/uL (ref 0.7–3.1)
Lymphs: 33 %
MCH: 30.3 pg (ref 26.6–33.0)
MCHC: 32.9 g/dL (ref 31.5–35.7)
MCV: 92 fL (ref 79–97)
Monocytes Absolute: 0.7 10*3/uL (ref 0.1–0.9)
Monocytes: 11 %
Neutrophils Absolute: 3.7 10*3/uL (ref 1.4–7.0)
Neutrophils: 54 %
Platelets: 171 10*3/uL (ref 150–450)
RBC: 4.26 x10E6/uL (ref 3.77–5.28)
RDW: 13.1 % (ref 11.7–15.4)
WBC: 6.8 10*3/uL (ref 3.4–10.8)

## 2023-10-25 LAB — BMP8+EGFR
BUN/Creatinine Ratio: 19 (ref 12–28)
BUN: 18 mg/dL (ref 8–27)
CO2: 23 mmol/L (ref 20–29)
Calcium: 9.4 mg/dL (ref 8.7–10.3)
Chloride: 105 mmol/L (ref 96–106)
Creatinine, Ser: 0.97 mg/dL (ref 0.57–1.00)
Glucose: 91 mg/dL (ref 70–99)
Potassium: 5.1 mmol/L (ref 3.5–5.2)
Sodium: 143 mmol/L (ref 134–144)
eGFR: 62 mL/min/{1.73_m2} (ref >59)

## 2023-10-25 LAB — LIPID PANEL W/O CHOL/HDL RATIO
Cholesterol, Total: 118 mg/dL (ref 100–199)
HDL: 51 mg/dL (ref >39)
LDL Chol Calc (NIH): 55 mg/dL (ref 0–99)
Triglycerides: 55 mg/dL (ref 0–149)
VLDL Cholesterol Cal: 12 mg/dL (ref 5–40)

## 2023-10-25 LAB — TSH: TSH: 1.22 u[IU]/mL (ref 0.450–4.500)

## 2023-10-25 LAB — BAYER DCA HB A1C WAIVED: HB A1C (BAYER DCA - WAIVED): 5.8 % — ABNORMAL HIGH (ref 4.8–5.6)

## 2023-10-25 LAB — VITAMIN D 25 HYDROXY (VIT D DEFICIENCY, FRACTURES): Vit D, 25-Hydroxy: 32.7 ng/mL (ref 30.0–100.0)

## 2023-11-08 ENCOUNTER — Other Ambulatory Visit: Payer: Self-pay

## 2023-11-08 DIAGNOSIS — L03116 Cellulitis of left lower limb: Secondary | ICD-10-CM

## 2023-11-14 ENCOUNTER — Ambulatory Visit: Payer: Medicare HMO | Admitting: Family Medicine

## 2023-11-14 ENCOUNTER — Ambulatory Visit (HOSPITAL_COMMUNITY)
Admission: RE | Admit: 2023-11-14 | Discharge: 2023-11-14 | Disposition: A | Discharge status: Home / Self Care | Source: Ambulatory Visit | Attending: Vascular Surgery | Admitting: Vascular Surgery

## 2023-11-14 DIAGNOSIS — L03116 Cellulitis of left lower limb: Secondary | ICD-10-CM

## 2023-11-15 ENCOUNTER — Ambulatory Visit: Payer: Medicare HMO | Admitting: Urology

## 2023-11-16 ENCOUNTER — Ambulatory Visit: Admitting: Physician Assistant

## 2023-11-16 VITALS — BP 137/84 | HR 56 | Temp 97.6°F | Ht 65.0 in | Wt 202.2 lb

## 2023-11-16 DIAGNOSIS — M7989 Other specified soft tissue disorders: Secondary | ICD-10-CM

## 2023-11-16 DIAGNOSIS — I872 Venous insufficiency (chronic) (peripheral): Secondary | ICD-10-CM

## 2023-11-16 DIAGNOSIS — M793 Panniculitis, unspecified: Secondary | ICD-10-CM

## 2023-11-17 NOTE — Progress Notes (Signed)
 Office Note   History of Present Illness   Tanya Rogers is a 73 y.o. (03/12/1951) female who presents for repeat evaluation of left lower extremity swelling and skin discoloration.  She was previously seen by our office in 2024 after having an episode of LLE cellulitis.  At today's repeat visit, she states over the past several months she has been dealing with more swelling in her left lower leg.  Her swelling is worse at the end of the day.  She also reports 2 patches of skin on her left lower leg that have become darker in color and tender to touch.   She does have a prior history of DVT.  She does not regularly wear compression stockings or elevate her legs.  Past Medical History:  Diagnosis Date   Cataract    Phreesia 02/09/2020   Glaucoma 2003 approx   Glaucoma    Phreesia 02/09/2020   Hypertension    Phreesia 02/09/2020   Obesity     Past Surgical History:  Procedure Laterality Date   COLONOSCOPY N/A 04/15/2008   COLONOSCOPY N/A 06/26/2019   Procedure: COLONOSCOPY;  Surgeon: Malissa Hippo, MD;  [facility]: [facility];  Service: Endoscopy;  Laterality: N/A;  830   EYE SURGERY Bilateral 12/2012 , 02/2013   [place], cataract   TONSILLECTOMY     TUBAL LIGATION      Social History   Socioeconomic History   Marital status: Married    Spouse name: Fayrene Fearing    Number of children: 3   Years of education: 12+   Highest education level: Associate degree: occupational, Scientist, product/process development, or vocational program  Occupational History   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    Social Drivers of Research scientist (physical sciences) Strain: Low Risk  (10/24/2023)   Overall Financial Resource Strain (CARDIA)    Difficulty of Paying Living Expenses: Not hard at all  Food Insecurity: No Food Insecurity (10/24/2023)   Hunger Vital Sign    Worried About Running Out of Food in the Last Year: Never true    Ran Out of Food in the Last Year: Never true  Transportation Needs: No Transportation Needs (10/24/2023)   PRAPARE - Administrator, Civil Service (Medical): No    Lack of Transportation (Non-Medical): No  Physical Activity: Insufficiently Active (10/24/2023)   Exercise Vital Sign    Days of Exercise per Week: 2 days    Minutes of Exercise per Session: 20 min  Stress: No Stress Concern Present (10/24/2023)   [employer] of Occupational Health - Occupational Stress Questionnaire    Feeling of Stress : Not at all  Social Connections: Socially Integrated (10/24/2023)   Social Connection and Isolation Panel [NHANES]    Frequency of Communication with Friends and Family: More than three times a week    Frequency of Social Gatherings with Friends and Family: More than three times a week    Attends Religious Services: More than 4 times per year  Active Member of Clubs or Organizations: Yes    Attends Banker Meetings: More than 4 times per year    Marital Status: Married  Catering manager Violence: Not At Risk (11/14/2022)   Humiliation, Afraid, Rape, and Kick questionnaire    Fear of Current or Ex-Partner: No    Emotionally Abused: No    Physically Abused: No    Sexually Abused: No    Family History  Problem Relation Age of Onset   Heart disease Mother    Hypertension Mother    Stroke Mother    Diabetes Mother    Diabetes Father    Hypertension Father    Diabetes Brother    Kidney disease Brother    Diabetes Brother    Diabetes Brother    Cancer Brother        lung     Current Outpatient Medications  Medication Sig Dispense Refill   dorzolamide-timolol (COSOPT) 22.3-6.8  MG/ML ophthalmic solution 1 drop 2 (two) times daily.     spironolactone (ALDACTONE) 25 MG tablet Take 1 tablet (25 mg total) by mouth daily. 90 tablet 3   No current facility-administered medications for this visit.    No Known Allergies  REVIEW OF SYSTEMS (negative unless checked):   Cardiac:  []  Chest pain or chest pressure? []  Shortness of breath upon activity? []  Shortness of breath when lying flat? []  Irregular heart rhythm?  Vascular:  []  Pain in calf, thigh, or hip brought on by walking? []  Pain in feet at night that wakes you up from your sleep? []  Blood clot in your veins? [x]  Leg swelling?  Pulmonary:  []  Oxygen at home? []  Productive cough? []  Wheezing?  Neurologic:  []  Sudden weakness in arms or legs? []  Sudden numbness in arms or legs? []  Sudden onset of difficult speaking or slurred speech? []  Temporary loss of vision in one eye? []  Problems with dizziness?  Gastrointestinal:  []  Blood in stool? []  Vomited blood?  Genitourinary:  []  Burning when urinating? []  Blood in urine?  Psychiatric:  []  Major depression  Hematologic:  []  Bleeding problems? []  Problems with blood clotting?  Dermatologic:  []  Rashes or ulcers?  Constitutional:  []  Fever or chills?  Ear/Nose/Throat:  []  Change in hearing? []  Nose bleeds? []  Sore throat?  Musculoskeletal:  []  Back pain? []  Joint pain? []  Muscle pain?   Physical Examination     Vitals:   11/16/23 1032  BP: 137/84  Pulse: (!) 56  Temp: 97.6 F (36.4 C)  TempSrc: Temporal  SpO2: 96%  Weight: 202 lb 3.2 oz (91.7 kg)  Height: 5\' 5"  (1.651 m)   Body mass index is 33.65 kg/m.  General:  WDWN in NAD; vital signs documented above Gait: Not observed HENT: WNL, normocephalic Pulmonary: normal non-labored breathing , without Rales, rhonchi,  wheezing Cardiac: regular Abdomen: soft, NT, no masses Skin: without rashes Vascular Exam/Pulses: BLE warm and well perfused Extremities: 2+ left  lower extremity swelling without obvious varicose veins.  2 areas of lipodermatosclerosis on the left lower medial and lateral calf with tenderness to touch.  No erythema of left lower extremity Musculoskeletal: no muscle wasting or atrophy  Neurologic: A&O X 3;  No focal weakness or paresthesias are detected Psychiatric:  The pt has Normal affect.  Non-invasive Vascular Imaging   LLE Venous Insufficiency Duplex (11/14/2023):  LEFT          Reflux NoRefluxReflux TimeDiameter cmsComments  Yes                                               +--------------+---------+------+-----------+------------+-----------------  --+  CFV          no                                                          +--------------+---------+------+-----------+------------+-----------------  --+  FV mid                  yes   >1 second                                   +--------------+---------+------+-----------+------------+-----------------  --+  Popliteal              yes   >1 second                                   +--------------+---------+------+-----------+------------+-----------------  --+  GSV at V [facility]    no                            0.50                          +--------------+---------+------+-----------+------------+-----------------  --+  GSV prox thighno                            0.49                          +--------------+---------+------+-----------+------------+-----------------  --+  GSV mid thigh no                            0.38                          +--------------+---------+------+-----------+------------+-----------------  --+  GSV dist thighno                            0.23                          +--------------+---------+------+-----------+------------+-----------------  --+  GSV at knee                                         too small              +--------------+---------+------+-----------+------------+-----------------  --+  GSV prox calf no                                    NV, 0.35  perforator  +--------------+---------+------+-----------+------------+-----------------  --+  GSV mid calf  0.30                          +--------------+---------+------+-----------+------------+-----------------  --+  GSV dist calf no                            0.24                          +--------------+---------+------+-----------+------------+-----------------  --+  SSV Pop Fossa no                            0.21                          +--------------+---------+------+-----------+------------+-----------------  --+  SSV prox calf no                            0.12                          +--------------+---------+------+-----------+------------+-----------------  --+  SSV mid calf  no                            0.16                          +--------------+---------+------+-----------+------------+-----------------  --+  AASV O        no                            0.18                          +--------------+---------+------+-----------+------------+-----------------  --+  AASV P        no                            0.20                          +--------------+---------+------+-----------+------------+-----------------  --+  AASV M                                              NV                    +--------------+---------+------+-----------+------------+-----------------    Medical Decision Making   Tanya Rogers is a 73 y.o. female who presents for venous sufficiency evaluation  Based on the patient's duplex, there is reflux in the left mid femoral vein and popliteal vein.  There is no evidence of superficial venous reflux in the left lower extremity.  There is no evidence of DVT. The patient reports a chronic history  of left lower extremity swelling, which has acutely worsened over the past 6 months.  Her swelling is typically worse at the end of the day.  She currently does not regularly wear compression stockings or elevate her legs She also reports further darkening of 2 skin patches around her medial and lateral left calf.  These areas are fairly tender to touch and generally sore On exam her lower extremities are warm and well-perfused.  She does have 2+ edema of the left lower leg.  She does have 2 areas of hyperpigmentation on the left distal medial and lateral calf.  These areas are indurated and tender to touch.  I believe these areas of skin are lipodermatosclerosis I have explained to the patient that she does have evidence of deep venous insufficiency, which is causing her leg swelling.  I have also explained that uncontrolled leg swelling can cause worsening of her lipodermatosclerosis.  She is not a candidate for vein ablation since she does not have any superficial venous reflux.  I have recommended that she wear compression stockings daily, avoid prolonged sitting and standing, and elevate her legs regularly.  This should help decrease her leg swelling and hopefully decrease her discomfort.  I have also explained that there is no way to get rid of her lipodermatosclerosis, but we can control its progression with proper leg compression and elevation She has been measured for and given a pair of 20 to 30 mmHg knee-high compression stockings.  She can follow-up with our office as needed  Ernestene Mention, PA-C [facility] Office: [phone]  11/17/2023, 9:35 AM  Clinic MD: Karin Lieu

## 2023-11-21 ENCOUNTER — Ambulatory Visit (INDEPENDENT_AMBULATORY_CARE_PROVIDER_SITE_OTHER): Payer: Medicare HMO

## 2023-11-21 VITALS — BP 128/68 | Ht 65.0 in | Wt 195.0 lb

## 2023-11-21 DIAGNOSIS — Z0000 Encounter for general adult medical examination without abnormal findings: Secondary | ICD-10-CM | POA: Diagnosis not present

## 2023-11-21 DIAGNOSIS — Z5320 Procedure and treatment not carried out because of patient's decision for unspecified reasons: Secondary | ICD-10-CM

## 2023-11-21 NOTE — Patient Instructions (Signed)
 Tanya Rogers , Thank you for taking time to come for your Medicare Wellness Visit. I appreciate your ongoing commitment to your health goals. Please review the following plan we discussed and let me know if I can assist you in the future.   Referrals/Orders/Follow-Ups/Clinician Recommendations: Follow Up and get mammogram done.  This is a list of the screening recommended for you and due dates:  Health Maintenance  Topic Date Due   Flu Shot  11/27/2023*   Medicare Annual Wellness Visit  11/20/2024   Mammogram  05/07/2025   Colon Cancer Screening  06/25/2029   Pneumonia Vaccine  Completed   DEXA scan (bone density measurement)  Completed   Hepatitis C Screening  Completed   Zoster (Shingles) Vaccine  Completed   HPV Vaccine  Aged Out   DTaP/Tdap/Td vaccine  Discontinued   COVID-19 Vaccine  Discontinued  *Topic was postponed. The date shown is not the original due date.    Advanced directives: (Declined) Advance directive discussed with you today. Even though you declined this today, please call our office should you change your mind, and we can give you the proper paperwork for you to fill out.  Next Medicare Annual Wellness Visit scheduled for next year: Yes

## 2023-11-21 NOTE — Progress Notes (Signed)
 Because this visit was a virtual/telehealth visit,  certain criteria was not obtained, such a blood pressure, CBG if applicable, and timed get up and go. Any medications not marked as "taking" were not mentioned during the medication reconciliation part of the visit. Any vitals not documented were not able to be obtained due to this being a telehealth visit or patient was unable to self-report a recent blood pressure reading due to a lack of equipment at home via telehealth. Vitals that have been documented are verbally provided by the patient.   Subjective:   Tanya Rogers is a 73 y.o. who presents for a Medicare Wellness preventive visit.  Visit Complete: Virtual I connected with  [name] on 11/21/23 by a audio enabled telemedicine application and verified that I am speaking with the correct person using two identifiers.  Patient Location: Home  Provider Location: Home Office  I discussed the limitations of evaluation and management by telemedicine. The patient expressed understanding and agreed to proceed.  Vital Signs: Because this visit was a virtual/telehealth visit, some criteria may be missing or patient reported. Any vitals not documented were not able to be obtained and vitals that have been documented are patient reported.  VideoDeclined- This patient declined Librarian, academic. Therefore the visit was completed with audio only.  Persons Participating in Visit: Patient.  AWV Questionnaire: No: Patient Medicare AWV questionnaire was not completed prior to this visit.  Cardiac Risk Factors include: advanced age (>65men, >74 women);hypertension     Objective:    Today's Vitals   11/21/23 0855 11/21/23 0856  BP: 128/68   Weight: 195 lb (88.5 kg)   Height: 5\' 5"  (1.651 m)   PainSc:  0-No pain   Body mass index is 32.45 kg/m.     11/21/2023    8:54 AM 11/14/2022    9:06 AM 07/21/2020    1:40 PM 06/26/2019    7:42 AM  07/09/2018    2:55 PM  Advanced Directives  Does Patient Have a Medical Advance Directive? No No No No No  Would patient like information on creating a medical advance directive? No - Patient declined No - Patient declined No - Patient declined Yes (MAU/Ambulatory/Procedural Areas - Information given) No - Patient declined    Current Medications (verified) Outpatient Encounter Medications as of 11/21/2023  Medication Sig   dorzolamide-timolol (COSOPT) 22.3-6.8 MG/ML ophthalmic solution 1 drop 2 (two) times daily.   spironolactone (ALDACTONE) 25 MG tablet Take 1 tablet (25 mg total) by mouth daily.   No facility-administered encounter medications on file as of 11/21/2023.    Allergies (verified) Patient has no known allergies.   History: Past Medical History:  Diagnosis Date   Cataract    Phreesia 02/09/2020   Glaucoma 2003 approx   Glaucoma    Phreesia 02/09/2020   Hypertension    Phreesia 02/09/2020   Obesity    Past Surgical History:  Procedure Laterality Date   COLONOSCOPY N/A 04/15/2008   COLONOSCOPY N/A 06/26/2019   Procedure: COLONOSCOPY;  Surgeon: Malissa Hippo, MD;  [facility];  Service: Endoscopy;  Laterality: N/A;  830   EYE SURGERY Bilateral 12/2012 , 02/2013   [place], cataract   TONSILLECTOMY     TUBAL LIGATION     Family History  Problem Relation Age of Onset   Heart disease Mother    Hypertension Mother    Stroke Mother    Diabetes Mother    Diabetes Father  Hypertension Father    Diabetes Brother    Kidney disease Brother    Diabetes Brother    Diabetes Brother    Cancer Brother        lung    Social History   Socioeconomic History   Marital status: Married    Spouse name: Fayrene Fearing    Number of children: 3   Years of education: 12+   Highest education level: Associate degree: occupational, Scientist, product/process development, or vocational program  Occupational History   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    Social Drivers of Corporate investment banker Strain: Low Risk  (11/21/2023)   Overall Financial Resource Strain (CARDIA)    Difficulty of Paying Living Expenses: Not hard at all  Food Insecurity: No Food Insecurity (11/21/2023)   Hunger Vital Sign    Worried About Running Out of Food in the Last Year: Never true    Ran Out of Food in the Last Year: Never true  Transportation Needs: No Transportation Needs (11/21/2023)   PRAPARE - Administrator, Civil Service (Medical): No    Lack of Transportation (Non-Medical): No  Physical Activity: Insufficiently Active (11/21/2023)   Exercise Vital Sign    Days of Exercise per Week: 2 days    Minutes of Exercise per Session: 20 min  Stress: No Stress Concern Present (11/21/2023)   [employer] of Occupational Health - Occupational Stress Questionnaire    Feeling of Stress : Not at all  Social Connections: Socially Integrated (11/21/2023)   Social Connection and Isolation Panel [NHANES]    Frequency of Communication with Friends and Family: More than three times a week    Frequency of Social Gatherings with Friends and Family: More than three times a week    Attends Religious Services: More than 4 times per year    Active Member of [employer] or Organizations: Yes    Attends Engineer, structural: More than 4 times per year    Marital Status: Married    Tobacco Counseling Counseling given: Not Answered    Clinical Intake:  Pre-visit preparation completed: Yes  Pain : No/denies pain Pain Score: 0-No pain     BMI - recorded: 32.45 Nutritional Status: BMI > 30  Obese Nutritional Risks: None Diabetes:  No  Lab Results  Component Value Date   HGBA1C 5.8 (H) 10/24/2023   HGBA1C 5.6 02/15/2023   HGBA1C 5.7 11/26/2020   HGBA1C 5.7 11/26/2020        Interpreter Needed?: No  Information entered by :: Isidoro Donning   Activities of Daily Living     11/21/2023    9:01 AM  In your present state of health, do you have any difficulty performing the following activities:  Hearing? 0  Vision? 0  Difficulty concentrating or making decisions? 0  Walking or climbing stairs? 0  Dressing or bathing? 0  Doing errands, shopping? 1  Preparing Food and eating ? N  Using the Toilet? N  In the past six months, have you accidently leaked urine? N  Do you have  problems with loss of bowel control? N  Managing your Medications? N  Managing your Finances? N  Housekeeping or managing your Housekeeping? N    Patient Care Team: Kerri Perches, MD as PCP - General Branch, Dorothe Pea, MD as PCP - Cardiology (Cardiology)  Indicate any recent Medical Services you may have received from other than [facility] providers in the past year (date may be approximate).     Assessment:   This is a routine wellness examination for Mateja.  Hearing/Vision screen Hearing Screening - Comments:: No hearing difficulties Vision Screening - Comments:: No issue seeing. [facility] in [place]   Goals Addressed               This Visit's Progress     Patient Stated (pt-stated)        Patient would like to take better care of herself        Depression Screen     11/21/2023    9:03 AM 08/15/2023    1:52 PM 07/31/2023    8:41 AM 07/18/2023    9:37 AM 04/04/2023    3:09 PM 02/15/2023    2:07 PM 11/14/2022    9:07 AM  PHQ 2/9 Scores  PHQ - 2 Score 2 2 0 0 0 0 0  PHQ- 9 Score 2 4 0 0       Fall Risk     11/21/2023    9:00 AM 10/24/2023    1:13 PM 08/15/2023    1:11 PM 07/31/2023    8:41 AM 07/18/2023    9:37 AM  Fall Risk   Falls in the past year? 0 0 0 0 0  Number falls in past  yr: 0 0 0 0 0  Injury with Fall? 0 0 0 0 0  Risk for fall due to : No Fall Risks No Fall Risks No Fall Risks No Fall Risks No Fall Risks  Follow up Falls evaluation completed Falls evaluation completed Falls evaluation completed Falls evaluation completed Falls evaluation completed    MEDICARE RISK AT HOME:  Medicare Risk at Home Any stairs in or around the home?: Yes If so, are there any without handrails?: No Home free of loose throw rugs in walkways, pet beds, electrical cords, etc?: Yes Adequate lighting in your home to reduce risk of falls?: Yes Life alert?: No Use of a cane, walker or w/c?: No Grab bars in the bathroom?: No Shower chair or bench in shower?: No Elevated toilet seat or a handicapped toilet?: Yes  TIMED UP AND GO:  Was the test performed?  No  Cognitive Function: 6CIT completed    11/14/2022    9:08 AM  MMSE - Mini Mental State Exam  Not completed: Unable to complete        11/21/2023    8:57 AM 11/14/2022    9:08 AM 11/02/2021    9:31 AM 07/21/2020    1:43 PM 07/18/2019    1:52 PM  6CIT Screen  What Year? 0 points 0 points 0 points 0 points 0 points  What month? 0 points 0 points 0 points 0 points 0 points  What time? 0 points 0 points 0 points 0 points 0 points  Count back from 20 0 points 0 points 0 points 0 points 0 points  Months in reverse 0 points 0 points  0 points 0 points  Repeat phrase 0 points 0 points  0 points 0 points  Total Score 0 points 0 points  0 points 0 points    Immunizations Immunization History  Administered Date(s) Administered   Pneumococcal Conjugate-13 09/06/2016   Pneumococcal Polysaccharide-23 12/20/2017   Tdap 08/17/2011   Zoster Recombinant(Shingrix) 02/15/2023, 08/15/2023   Zoster, Live 08/17/2011    Screening Tests Health Maintenance  Topic Date Due   INFLUENZA VACCINE  11/27/2023 (Originally 03/30/2023)   Medicare Annual Wellness (AWV)  08/15/2024   MAMMOGRAM  05/07/2025   Colonoscopy  06/25/2029    Pneumonia Vaccine 10+ Years old  Completed   DEXA SCAN  Completed   Hepatitis C Screening  Completed   Zoster Vaccines- Shingrix  Completed   HPV VACCINES  Aged Out   DTaP/Tdap/Td  Discontinued   COVID-19 Vaccine  Discontinued    Health Maintenance  There are no preventive care reminders to display for this patient. Health Maintenance Items Addressed: patient states her mammogram is already ordered  Additional Screening:  Vision Screening: Recommended annual ophthalmology exams for early detection of glaucoma and other disorders of the eye.  Dental Screening: Recommended annual dental exams for proper oral hygiene  Community Resource Referral / Chronic Care Management: CRR required this visit?  No   CCM required this visit?  No     Plan:     I have personally reviewed and noted the following in the patient's chart:   Medical and social history Use of alcohol, tobacco or illicit drugs  Current medications and supplements including opioid prescriptions. Patient is not currently taking opioid prescriptions. Functional ability and status Nutritional status Physical activity Advanced directives List of other physicians Hospitalizations, surgeries, and ER visits in previous 12 months Vitals Screenings to include cognitive, depression, and falls Referrals and appointments  In addition, I have reviewed and discussed with patient certain preventive protocols, quality metrics, and best practice recommendations. A written personalized care plan for preventive services as well as general preventive health recommendations were provided to patient.     Rudi Heap, [place]   11/21/2023   After Visit Summary: (MyChart) Due to this being a telephonic visit, the after visit summary with patients personalized plan was offered to patient via MyChart   Notes: Nothing significant to report at this time.

## 2023-11-24 ENCOUNTER — Encounter

## 2023-12-05 ENCOUNTER — Encounter

## 2023-12-05 ENCOUNTER — Ambulatory Visit (INDEPENDENT_AMBULATORY_CARE_PROVIDER_SITE_OTHER)

## 2023-12-05 VITALS — BP 125/77 | HR 63 | Ht 65.0 in | Wt 202.0 lb

## 2023-12-05 DIAGNOSIS — W57XXXA Bitten or stung by nonvenomous insect and other nonvenomous arthropods, initial encounter: Secondary | ICD-10-CM

## 2023-12-05 DIAGNOSIS — S0006XA Insect bite (nonvenomous) of scalp, initial encounter: Secondary | ICD-10-CM | POA: Diagnosis not present

## 2023-12-05 MED ORDER — DOXYCYCLINE HYCLATE 100 MG PO TABS: 100.0000 mg | ORAL_TABLET | Freq: Two times a day (BID) | ORAL | 0 refills | Status: AC

## 2023-12-05 NOTE — Progress Notes (Unsigned)
   Acute Office Visit  Subjective:     Patient ID: Tanya Rogers, female    DOB: 17-Feb-1951, 73 y.o.   MRN: [id number]  Chief Complaint  Patient presents with   Care Management    Pt states "Bit by a tick on her head and removed herself last week and she has a lump where the tick was and has some discomfort"    HPI Patient is in today for tick bite to back of head.  States that her son pulled this out with tweezers.    ROS      Objective:    BP 125/77 (BP Location: Left Arm, Patient Position: Sitting, Cuff Size: Normal)   Pulse 63   Ht 5\' 5"  (1.651 m)   Wt 202 lb (91.6 kg)   SpO2 94%   BMI 33.61 kg/m    Physical Exam Vitals and nursing note reviewed.  Constitutional:      Appearance: Normal appearance.  Eyes:     Extraocular Movements: Extraocular movements intact.     Pupils: Pupils are equal, round, and reactive to light.  Lymphadenopathy:     Head:     Right side of head: Occipital adenopathy present. No posterior auricular adenopathy.     Left side of head: No posterior auricular or occipital adenopathy.     Cervical: No cervical adenopathy.  Skin:    Findings: Erythema and lesion (raised lesion on rt occipital area of scalp, with surrounding erythema and swelling) present.  Neurological:     Mental Status: She is alert and oriented to person, place, and time.  Psychiatric:        Mood and Affect: Mood normal.        Thought Content: Thought content normal.     No results found for any visits on 12/05/23.      Assessment & Plan:   Problem List Items Addressed This Visit   None Visit Diagnoses       Tick bite of scalp, initial encounter    -  Primary   add oral doxycycline for treatment of known tick bite.  recommend applying a topical hydrocortisone cream for itch/irritation relief.   Relevant Medications   doxycycline (VIBRA-TABS) 100 MG tablet       Meds ordered this encounter  Medications   doxycycline (VIBRA-TABS) 100 MG tablet     Sig: Take 1 tablet (100 mg total) by mouth 2 (two) times daily for 7 days.    Dispense:  14 tablet    Refill:  0    No follow-ups on file.  Darral Dash, FNP

## 2023-12-11 ENCOUNTER — Ambulatory Visit: Payer: Medicare HMO

## 2023-12-20 ENCOUNTER — Ambulatory Visit: Payer: Self-pay

## 2023-12-20 ENCOUNTER — Other Ambulatory Visit: Payer: Self-pay | Admitting: Internal Medicine

## 2023-12-20 DIAGNOSIS — S80861A Insect bite (nonvenomous), right lower leg, initial encounter: Secondary | ICD-10-CM

## 2023-12-20 MED ORDER — DOXYCYCLINE HYCLATE 100 MG PO TABS
100.0000 mg | ORAL_TABLET | Freq: Two times a day (BID) | ORAL | 0 refills | Status: DC
Start: 2023-12-20 — End: 2024-01-29

## 2023-12-20 NOTE — Telephone Encounter (Signed)
  Chief Complaint: tick bite Symptoms: itching  Disposition: [] ED /[] Urgent Care (no appt availability in office) / [] Appointment(In office/virtual)/ []  [facility]/ [] Home Care/ [] Refused Recommended Disposition /[] [facility]/ [x]  Follow-up with PCP Additional Notes: Pt calling with (removed) tick bite behind right knee. Pt was just treated for another tick bite several weeks ago. Pt states the bite itches. She can't see behind knee,but denied warm to touch or swelling. Pt is requesting abx be called in without another appt. No appt made at this time. RN gave home care advice and pt verbalized understanding. Please advise and inform pt if medication can be called in without appt.             Reason for Disposition  [1] Probable deer tick AND [2] attached 36 hours or more (or tick appears swollen, not flat) AND [3] occurred in an area where Lyme disease is common  Answer Assessment - Initial Assessment Questions 1. ATTACHED:  "Is the tick still on the skin?"  (e.g., yes, no, unsure)     no 2. ONSET - TICK STILL ATTACHED:  "How long do you think the tick has been on your skin?" (e.g., hours, days, unsure)  Note:  Is there a recent activity (camping, hiking) where the caller may have been exposed?     no 3. ONSET - TICK NOT STILL ATTACHED: "If the tick has been removed, how long do you think the tick was attached before you removed it?" (e.g., 5 hours, 2 days). "When was this?"     Not sure 4. LOCATION: "Where is the tick bite located?" (e.g., arm, leg)     Right-behind knee 5. TYPE of TICK: "Is it a wood tick or a deer tick?" (e.g., deer tick, wood tick; unsure)     Not  sure 6. SIZE of TICK: "How big is the tick?" (e.g., size of poppy seed, apple seed, watermelon seed; unsure) Note: Deer ticks can be the size of a poppy seed (nymph) or an apple seed (adult).       Not sure 7. ENGORGED: "Did the tick look flat or engorged (full, swollen)?" (e.g., flat,  engorged; unsure)     Not sure  8. OTHER SYMPTOMS: "Do you have any other symptoms?" (e.g., fever, rash, redness at bite area, red ring around bite)     Itching  Protocols used: Tick Bite-A-AH

## 2023-12-20 NOTE — Telephone Encounter (Signed)
 Not a patient with our office - forwarding message to listed PCP's covering provider.

## 2023-12-21 NOTE — Telephone Encounter (Signed)
 Left detailed vm informing pt rx was sent to pharmacy.

## 2024-01-25 ENCOUNTER — Telehealth: Payer: Self-pay | Admitting: Cardiology

## 2024-01-25 NOTE — Telephone Encounter (Signed)
 Pt came into [place] office and expressed she has been having some chest discomfort/pain. The other night she had some discomfort on her left side and her arm has a tingling sensation. She wanted our soonest appt with [facility] or someone who could get her in as soon as possible. I have scheduled her with [place] on 02/06/2024 at 1:30 as that is our soonest and have placed her on the wait list.

## 2024-01-25 NOTE — Telephone Encounter (Signed)
 Left message to return call

## 2024-01-26 NOTE — Telephone Encounter (Signed)
 Left message to call back to provide more information to send to the provider.

## 2024-01-26 NOTE — Telephone Encounter (Signed)
 Patient returned RN's call.

## 2024-01-29 NOTE — Telephone Encounter (Signed)
 Left message for patient to call back

## 2024-01-29 NOTE — Telephone Encounter (Signed)
 Patient states she is feeling a lot better than she was.  Patient states last week she had chest discomfort not pain or pressure but discomfort.  She states it felt like she had went for a run and could get relaxed. And pain under her left arm.  Patient states she has started taking an Aspirin 81 Mg daily and it eased everything.  This had just started last week.  Patient BP has been fluctuating  138/80  128/72 Patient has an appointment with us  on 6/10 with [place] strader

## 2024-01-30 NOTE — Telephone Encounter (Signed)
 Patient informed and verbalized understanding of plan.

## 2024-01-31 ENCOUNTER — Ambulatory Visit: Attending: Nurse Practitioner | Admitting: Nurse Practitioner

## 2024-01-31 ENCOUNTER — Encounter: Payer: Self-pay | Admitting: Nurse Practitioner

## 2024-01-31 VITALS — BP 140/80 | HR 60 | Ht 65.0 in | Wt 202.6 lb

## 2024-01-31 DIAGNOSIS — Z01810 Encounter for preprocedural cardiovascular examination: Secondary | ICD-10-CM

## 2024-01-31 DIAGNOSIS — I10 Essential (primary) hypertension: Secondary | ICD-10-CM | POA: Diagnosis not present

## 2024-01-31 DIAGNOSIS — I739 Peripheral vascular disease, unspecified: Secondary | ICD-10-CM

## 2024-01-31 DIAGNOSIS — R079 Chest pain, unspecified: Secondary | ICD-10-CM | POA: Diagnosis not present

## 2024-01-31 DIAGNOSIS — E669 Obesity, unspecified: Secondary | ICD-10-CM | POA: Diagnosis not present

## 2024-01-31 MED ORDER — NITROGLYCERIN 0.4 MG SL SUBL: 0.4000 mg | SUBLINGUAL_TABLET | SUBLINGUAL | 3 refills | Status: AC | PRN

## 2024-01-31 MED ORDER — METOPROLOL TARTRATE 100 MG PO TABS: ORAL_TABLET | ORAL | 0 refills | Status: DC

## 2024-01-31 NOTE — Progress Notes (Signed)
 Cardiology Office Note   Date:  01/31/2024 ID:  Neena Baller, DOB Oct 18, 1950, MRN [id number] PCP: Towanda Fret, MD  [facility] Providers Cardiologist:  Armida Lander, MD     History of Present Illness Tanya Rogers is a delightful 73 y.o. female with a PMH of hypertension, leg edema, fatigue, glaucoma, venous insufficiency, who presents today for 77-month follow-up appointment and chest pain evaluation.   Last seen by Dr. Armida Lander on June 07, 2023.  It was noted she was having leg edema.  Echocardiogram was arranged and was benign.  Was instructed to take Lasix  40 mg as needed.  Today she presents for 18-month follow-up appointment.  She also is here for chest pain evaluation.  She walked into the office on Jan 25, 2024 noting some chest discomfort/pain.  She wanted a soonest appointment with Dr. Amanda Jungling or APP.  She described the sensation as feeling like she went for a run and could not get relax.  She also had left arm pain under her left arm.  Started taking a baby aspirin and states that her symptoms were resolved.  Blood pressure well-controlled.  She is scheduled today for office evaluation.  She states she has not had issues since taking her baby aspirin.  Still would like to prioritize her health and wants to be evaluated.  She has not had ischemic evaluation in the past. Denies any any recent chest pain, shortness of breath, palpitations, syncope, presyncope, dizziness, orthopnea, PND, swelling or significant weight changes, acute bleeding, or claudication.   ROS: Negative.  See HPI. SH: Daughter is retired from [employer] for 28 years.  States daughter diagnosed with PTSD and has been helping her daughter for the last 2 years.  Admits to stress with this and weight gain.  Patient states she is planning to prioritize her health currently.  Studies Reviewed  EKG: EKG Interpretation Date/Time:  Wednesday January 31 2024 16:09:28 EDT Ventricular Rate:   64 PR Interval:  134 QRS Duration:  72 QT Interval:  404 QTC Calculation: 416 R Axis:   22  Text Interpretation: Normal sinus rhythm Low voltage QRS Cannot rule out Anterior infarct (cited on or before 31-Jan-2024) When compared with ECG of 07-Jun-2023 15:10, QRS axis Shifted right Confirmed by Lasalle Pointer [id number] on 01/31/2024 4:10:01 PM   Echocardiogram 06/2023: 1. Left ventricular ejection fraction, by estimation, is 60 to 65%. The  left ventricle has normal function. The left ventricle has no regional  wall motion abnormalities. There is mild concentric left ventricular  hypertrophy. Left ventricular diastolic  parameters are consistent with Grade I diastolic dysfunction (impaired  relaxation).   2. Right ventricular systolic function is normal. The right ventricular  size is normal. There is normal pulmonary artery systolic pressure. The  estimated right ventricular systolic pressure is 33.0 mmHg.   3. The mitral valve is grossly normal. Mild mitral valve regurgitation.   4. The aortic valve is tricuspid. Aortic valve regurgitation is not  visualized. No aortic stenosis is present.   5. The inferior vena cava is normal in size with greater than 50%  respiratory variability, suggesting right atrial pressure of 3 mmHg.   Comparison(s): Prior images reviewed side by side. LVEF remains normal range at 60-65%.   Physical Exam VS:  BP (!) 142/80 (BP Location: Right Arm, Patient Position: Sitting)   Pulse 60   Ht 5\' 5"  (1.651 m)   Wt 202 lb 9.6 oz (91.9 kg)   SpO2 97%  BMI 33.71 kg/m    Wt Readings from Last 3 Encounters:  01/31/24 202 lb 9.6 oz (91.9 kg)  12/05/23 202 lb (91.6 kg)  11/21/23 195 lb (88.5 kg)    GEN: Obese, 73 y.o. female in no acute distress NECK: No JVD; No carotid bruits CARDIAC: S1/S2, RRR, no murmurs, rubs, gallops RESPIRATORY:  Clear to auscultation without rales, wheezing or rhonchi  ABDOMEN: Soft, non-tender, non-distended EXTREMITIES:  No edema;  No deformity   ASSESSMENT AND PLAN  Chest pain of uncertain etiology Admits to recent symptoms, resolved after taking a baby aspirin.  She has not had ischemic evaluation in the past.  Does have a mixture of atypical versus typical symptoms/presentation.  Will arrange CCTA and prescribe one-time dose of metoprolol .  Will obtain BMET prior to testing.  Continue current medication regimen and will prescribe nitroglycerin  as needed.  I instructed her about this medication and she verbalized understanding.  Care and ED precautions discussed.  HTN Blood pressure elevated today.  Tells me BP is usually well-controlled. Discussed to monitor BP at home at least 2 hours after medications and sitting for 5-10 minutes.  Discussed SBP goal is less than 140.  No medication changes at this time.  Will continue to monitor. Heart healthy diet and regular cardiovascular exercise encouraged.   Obesity Weight loss via diet and exercise encouraged. Discussed the impact being overweight would have on cardiovascular risk.    Dispo: Follow-up with MD/APP in 6 weeks or sooner anything changes.  Signed, Lasalle Pointer, NP

## 2024-01-31 NOTE — Patient Instructions (Addendum)
 Medication Instructions:  Your physician has recommended you make the following change in your medication:  The proper use and anticipated side effects of nitroglycerine has been carefully explained.  If a single episode of chest pain is not relieved by one tablet, the patient will try another within 5 minutes; and if this doesn't relieve the pain, the patient is instructed to call 911 for transportation to an emergency department.  Labwork: BMET 2 weeks prior to your coronary CTA at [facility]   Testing/Procedures:   Your cardiac CT will be scheduled at one of the below locations:   [facility] and Vascular Tower [address]  [place], [place] [address] Opening December 25, 2023  If scheduled at the Heart and Vascular Tower at [facility], please enter the parking lot using the [address] street entrance and use the FREE valet service at the patient drop-off area. Enter the buidling and check-in with registration on the main floor.  Please follow these instructions carefully (unless otherwise directed):  An IV will be required for this test and Nitroglycerin will be given.  Hold all erectile dysfunction medications at least 3 days (72 hrs) prior to test. (Ie viagra, cialis, sildenafil, tadalafil, etc)   On the Night Before the Test: Be sure to Drink plenty of water . Do not consume any caffeinated/decaffeinated beverages or chocolate 12 hours prior to your test. Do not take any antihistamines 12 hours prior to your test.  On the Day of the Test: Drink plenty of water  until 1 hour prior to the test. Do not eat any food 1 hour prior to test. You may take your regular medications prior to the test.  Take metoprolol 100 MG (Lopressor) two hours prior to test.(This has been sent to your pharmacy)  please HOLD ALDACTONE  on the morning of the test. Patients who wear a continuous glucose monitor MUST remove the device prior to scanning. FEMALES- please wear underwire-free bra if  available, avoid dresses & tight clothing  After the Test: Drink plenty of water . After receiving IV contrast, you may experience a mild flushed feeling. This is normal. On occasion, you may experience a mild rash up to 24 hours after the test. This is not dangerous. If this occurs, you can take Benadryl 25 mg, Zyrtec, Claritin, or Allegra and increase your fluid intake. (Patients taking Tikosyn should avoid Benadryl, and may take Zyrtec, Claritin, or Allegra) If you experience trouble breathing, this can be serious. If it is severe call 911 IMMEDIATELY. If it is mild, please call our office.  We will call to schedule your test 2-4 weeks out understanding that some insurance companies will need an authorization prior to the service being performed.   For more information and frequently asked questions, please visit our website : [email or web address]  For non-scheduling related questions, please contact the cardiac imaging nurse navigator should you have any questions/concerns: Cardiac Imaging Nurse Navigators Direct Office Dial: [phone]   For scheduling needs, including cancellations and rescheduling, please call [place], [phone].   Follow-Up: Your physician recommends that you schedule a follow-up appointment in: 6 weeks    Any Other Special Instructions Will Be Listed Below (If Applicable).  If you need a refill on your cardiac medications before your next appointment, please call your pharmacy.

## 2024-02-01 ENCOUNTER — Other Ambulatory Visit: Payer: Self-pay | Admitting: Medical Genetics

## 2024-02-06 ENCOUNTER — Other Ambulatory Visit (HOSPITAL_COMMUNITY)
Admission: RE | Admit: 2024-02-06 | Discharge: 2024-02-06 | Disposition: A | Discharge status: Home / Self Care | Payer: Self-pay | Source: Ambulatory Visit | Attending: Oncology | Admitting: Oncology

## 2024-02-06 ENCOUNTER — Ambulatory Visit: Admitting: Student

## 2024-02-13 ENCOUNTER — Telehealth (HOSPITAL_COMMUNITY): Payer: Self-pay | Admitting: Emergency Medicine

## 2024-02-13 LAB — BASIC METABOLIC PANEL WITH GFR
BUN/Creatinine Ratio: 21 (ref 12–28)
BUN: 19 mg/dL (ref 8–27)
CO2: 20 mmol/L (ref 20–29)
Calcium: 8.7 mg/dL (ref 8.7–10.3)
Chloride: 107 mmol/L — ABNORMAL HIGH (ref 96–106)
Creatinine, Ser: 0.89 mg/dL (ref 0.57–1.00)
Glucose: 98 mg/dL (ref 70–99)
Potassium: 4.7 mmol/L (ref 3.5–5.2)
Sodium: 140 mmol/L (ref 134–144)
eGFR: 69 mL/min/{1.73_m2} (ref >59)

## 2024-02-13 NOTE — Telephone Encounter (Signed)
 Pt reports she rescheduled her CCTA for 6/27 Jinger Mount RN Navigator Cardiac Imaging [facility] [phone] Office  [phone] Cell

## 2024-02-14 ENCOUNTER — Ambulatory Visit (HOSPITAL_COMMUNITY): Admission: RE | Admit: 2024-02-14 | Source: Ambulatory Visit

## 2024-02-17 LAB — GENECONNECT MOLECULAR SCREEN: Genetic Analysis Overall Interpretation: NEGATIVE

## 2024-02-22 ENCOUNTER — Ambulatory Visit: Payer: Self-pay | Admitting: Nurse Practitioner

## 2024-02-22 ENCOUNTER — Telehealth (HOSPITAL_COMMUNITY): Payer: Self-pay | Admitting: *Deleted

## 2024-02-22 NOTE — Telephone Encounter (Signed)
 Reaching out to patient to offer assistance regarding upcoming cardiac imaging study; pt verbalizes understanding of appt date/time, parking situation and where to check in, pre-test NPO status, and verified current allergies; name and call back number provided for further questions should they arise  Larey Brick RN Navigator Cardiac Imaging [facility] [phone] office [phone] cell

## 2024-02-22 NOTE — Telephone Encounter (Signed)
 Attempted to call patient regarding upcoming cardiac CT appointment. Left message on voicemail with name and callback number  Larey Brick RN Navigator Cardiac Imaging [facility] [phone] Office [phone] Cell

## 2024-02-23 ENCOUNTER — Ambulatory Visit (HOSPITAL_COMMUNITY)
Admission: RE | Admit: 2024-02-23 | Discharge: 2024-02-23 | Disposition: A | Discharge status: Home / Self Care | Source: Ambulatory Visit | Attending: Nurse Practitioner | Admitting: Nurse Practitioner

## 2024-02-23 DIAGNOSIS — I358 Other nonrheumatic aortic valve disorders: Secondary | ICD-10-CM | POA: Insufficient documentation

## 2024-02-23 DIAGNOSIS — R079 Chest pain, unspecified: Secondary | ICD-10-CM | POA: Diagnosis present

## 2024-02-23 MED ORDER — NITROGLYCERIN 0.4 MG SL SUBL
0.8000 mg | SUBLINGUAL_TABLET | Freq: Once | SUBLINGUAL | Status: AC
  Administered 2024-02-23: 0.8 mg via SUBLINGUAL

## 2024-02-23 MED ORDER — IOHEXOL 350 MG/ML SOLN
100.0000 mL | Freq: Once | INTRAVENOUS | Status: AC | PRN
Start: 2024-02-23 — End: 2024-02-23
  Administered 2024-02-23: 100 mL via INTRAVENOUS

## 2024-03-21 ENCOUNTER — Ambulatory Visit: Attending: Nurse Practitioner | Admitting: Nurse Practitioner

## 2024-03-21 ENCOUNTER — Encounter: Payer: Self-pay | Admitting: Nurse Practitioner

## 2024-03-21 VITALS — BP 128/70 | HR 55 | Ht 65.0 in | Wt 203.6 lb

## 2024-03-21 DIAGNOSIS — I10 Essential (primary) hypertension: Secondary | ICD-10-CM

## 2024-03-21 DIAGNOSIS — E669 Obesity, unspecified: Secondary | ICD-10-CM

## 2024-03-21 DIAGNOSIS — R0609 Other forms of dyspnea: Secondary | ICD-10-CM | POA: Diagnosis not present

## 2024-03-21 NOTE — Progress Notes (Signed)
 Cardiology Office Note   Date:  03/21/2024 ID:  OUIDA ABEYTA, DOB Dec 29, 1950, MRN [id number] PCP: Antonetta Rollene BRAVO, MD  [facility] Providers Cardiologist:  Alvan Carrier, MD     History of Present Illness Tanya Rogers is a delightful 73 y.o. female with a PMH of hypertension, leg edema, fatigue, glaucoma, venous insufficiency, who presents today for 6-week follow-up appointment.   Last seen by Dr. Carrier Alvan on June 07, 2023.  It was noted she was having leg edema.  Echocardiogram was arranged and was benign.  Was instructed to take Lasix  40 mg as needed.  01/31/2024 - Today she presents for 64-month follow-up appointment.  She also is here for chest pain evaluation.  She walked into the office on Jan 25, 2024 noting some chest discomfort/pain.  She wanted a soonest appointment with Dr. Alvan or APP.  She described the sensation as feeling like she went for a run and could not get relax.  She also had left arm pain under her left arm.  Started taking a baby aspirin and states that her symptoms were resolved.  Blood pressure well-controlled.  She is scheduled today for office evaluation.  She states she has not had issues since taking her baby aspirin.  Still would like to prioritize her health and wants to be evaluated.  She has not had ischemic evaluation in the past. Denies any any recent chest pain, shortness of breath, palpitations, syncope, presyncope, dizziness, orthopnea, PND, swelling or significant weight changes, acute bleeding, or claudication.  03/21/2024 - She is here for follow-up. Doing well. Denies any recurrent chest pain. Denies any chest pain, palpitations, syncope, presyncope, dizziness, orthopnea, PND, swelling or significant weight changes, acute bleeding, or claudication. Planning to lose weight. Does admit to some DOE at times.    ROS: Negative.  See HPI. SH: Daughter is retired from [employer] for 28 years.  States daughter diagnosed with  PTSD and has been helping her daughter for the last 2 years.  Admits to stress with this and weight gain.  Patient states she is planning to prioritize her health currently.  Studies Reviewed  EKG: EKG is not ordered today.   CCTA 01/2024:  IMPRESSION: 1. No evidence of coronary disease, CADRADS=0.   2. Coronary calcium  score of 0. This was 0 percentile for age-, sex, and race-matched controls.   3. Total plaque volume not available.   4. Normal coronary origin with right dominance.   RECOMMENDATIONS: CAD-RADS 0: No evidence of CAD (0%). Consider non-atherosclerotic causes of chest pain.   IMPRESSION: No acute extracardiac incidental findings.  Echocardiogram 06/2023: 1. Left ventricular ejection fraction, by estimation, is 60 to 65%. The  left ventricle has normal function. The left ventricle has no regional  wall motion abnormalities. There is mild concentric left ventricular  hypertrophy. Left ventricular diastolic  parameters are consistent with Grade I diastolic dysfunction (impaired  relaxation).   2. Right ventricular systolic function is normal. The right ventricular  size is normal. There is normal pulmonary artery systolic pressure. The  estimated right ventricular systolic pressure is 33.0 mmHg.   3. The mitral valve is grossly normal. Mild mitral valve regurgitation.   4. The aortic valve is tricuspid. Aortic valve regurgitation is not  visualized. No aortic stenosis is present.   5. The inferior vena cava is normal in size with greater than 50%  respiratory variability, suggesting right atrial pressure of 3 mmHg.   Comparison(s): Prior images reviewed side by side.  LVEF remains normal range at 60-65%.   Physical Exam VS:  BP 128/70   Pulse (!) 55   Ht 5' 5 (1.651 m)   Wt 203 lb 9.6 oz (92.4 kg)   SpO2 99%   BMI 33.88 kg/m    Wt Readings from Last 3 Encounters:  03/21/24 203 lb 9.6 oz (92.4 kg)  01/31/24 202 lb 9.6 oz (91.9 kg)  12/05/23 202 lb (91.6  kg)    GEN: Obese, 73 y.o. female in no acute distress NECK: No JVD; No carotid bruits CARDIAC: S1/S2, RRR, no murmurs, rubs, gallops RESPIRATORY:  Clear to auscultation without rales, wheezing or rhonchi  ABDOMEN: Soft, non-tender, non-distended EXTREMITIES:  No edema; No deformity   ASSESSMENT AND PLAN  HTN Blood pressure stable.. Discussed to monitor BP at home at least 2 hours after medications and sitting for 5-10 minutes.  Discussed SBP goal is less than 140.  No medication changes at this time.  Heart healthy diet and regular cardiovascular exercise encouraged.   Obesity Weight loss via diet and exercise encouraged. Discussed the impact being overweight would have on cardiovascular risk.  3. Dyspnea on exertion Overall past reassuring cardiac workup. Recommended lifestyle changes and regular exercise. If no improvement by next office visit, will consider PFT's. Continue to follow with PCP.    Dispo: Care and ED precautions discussed. Follow-up with MD/APP in 3-4 months or sooner anything changes.  Signed, Almarie Crate, NP

## 2024-03-21 NOTE — Patient Instructions (Addendum)
 Medication Instructions:  Your physician recommends that you continue on your current medications as directed. Please refer to the Current Medication list given to you today.  Labwork: None   Testing/Procedures: None   Follow-Up: Your physician recommends that you schedule a follow-up appointment in: 3-4 months   Any Other Special Instructions Will Be Listed Below (If Applicable).  If you need a refill on your cardiac medications before your next appointment, please call your pharmacy.

## 2024-05-09 ENCOUNTER — Ambulatory Visit (HOSPITAL_COMMUNITY): Payer: Medicare HMO

## 2024-05-13 ENCOUNTER — Ambulatory Visit (HOSPITAL_COMMUNITY)
Admission: RE | Admit: 2024-05-13 | Discharge: 2024-05-13 | Disposition: A | Discharge status: Home / Self Care | Source: Ambulatory Visit | Attending: Family Medicine | Admitting: Family Medicine

## 2024-05-13 ENCOUNTER — Encounter (HOSPITAL_COMMUNITY): Payer: Self-pay

## 2024-05-13 DIAGNOSIS — Z1231 Encounter for screening mammogram for malignant neoplasm of breast: Secondary | ICD-10-CM | POA: Diagnosis present

## 2024-05-23 ENCOUNTER — Ambulatory Visit: Payer: Medicare HMO | Admitting: Family Medicine

## 2024-05-31 ENCOUNTER — Other Ambulatory Visit: Payer: Self-pay | Admitting: Family Medicine

## 2024-05-31 DIAGNOSIS — R609 Edema, unspecified: Secondary | ICD-10-CM

## 2024-06-03 ENCOUNTER — Ambulatory Visit
Admission: RE | Admit: 2024-06-03 | Discharge: 2024-06-03 | Disposition: A | Discharge status: Home / Self Care | Source: Ambulatory Visit | Attending: Family Medicine | Admitting: Family Medicine

## 2024-06-03 DIAGNOSIS — R609 Edema, unspecified: Secondary | ICD-10-CM

## 2024-06-03 MED ORDER — IOPAMIDOL (ISOVUE-300) INJECTION 61%
115.0000 mL | Freq: Once | INTRAVENOUS | Status: AC | PRN
Start: 2024-06-03 — End: 2024-06-03
  Administered 2024-06-03: 115 mL via INTRAVENOUS

## 2024-06-14 ENCOUNTER — Ambulatory Visit: Payer: Self-pay

## 2024-06-25 ENCOUNTER — Ambulatory Visit: Attending: Nurse Practitioner | Admitting: Nurse Practitioner

## 2024-06-25 ENCOUNTER — Ambulatory Visit: Admitting: Nurse Practitioner

## 2024-06-25 ENCOUNTER — Encounter: Payer: Self-pay | Admitting: Nurse Practitioner

## 2024-06-25 VITALS — BP 148/88 | HR 71 | Ht 65.0 in | Wt 204.8 lb

## 2024-06-25 DIAGNOSIS — I10 Essential (primary) hypertension: Secondary | ICD-10-CM | POA: Diagnosis not present

## 2024-06-25 DIAGNOSIS — R0609 Other forms of dyspnea: Secondary | ICD-10-CM | POA: Diagnosis not present

## 2024-06-25 DIAGNOSIS — E669 Obesity, unspecified: Secondary | ICD-10-CM

## 2024-06-25 NOTE — Patient Instructions (Addendum)
 Medication Instructions:  Your physician has recommended you make the following change in your medication:  Start taking Spironolactone  25 mg once daily as prescribed by primary care physician  Continue taking all other medications as prescribed  Labwork: BMET in 2 weeks at [facility] in [place]   Testing/Procedures: None  Follow-Up: Your physician recommends that you schedule a follow-up appointment in: 1 year. You will receive a reminder call in about 8 months reminding you to schedule your appointment. If you don't receive this call, please contact our office.   Any Other Special Instructions Will Be Listed Below (If Applicable). Please contact our office if your BP top number continues to be over 140.    Thank you for choosing [facility]!     If you need a refill on your cardiac medications before your next appointment, please call your pharmacy.

## 2024-06-25 NOTE — Progress Notes (Signed)
 Cardiology Office Note   Date:  06/25/2024 ID:  Tanya Rogers, DOB 1951-04-10, MRN [id number] PCP: Tanya Rollene BRAVO, MD  [facility] Providers Cardiologist:  Alvan Carrier, MD     History of Present Illness Tanya Rogers is a delightful 73 y.o. female with a PMH of hypertension, leg edema, fatigue, glaucoma, venous insufficiency, who presents today for 3-4 month follow-up appointment.   Last seen by Dr. Carrier Alvan on June 07, 2023.  It was noted she was having leg edema.  Echocardiogram was arranged and was benign.  Was instructed to take Lasix  40 mg as needed.  01/31/2024 - Today she presents for 35-month follow-up appointment.  She also is here for chest pain evaluation.  She walked into the office on Jan 25, 2024 noting some chest discomfort/pain.  She wanted a soonest appointment with Dr. Alvan or APP.  She described the sensation as feeling like she went for a run and could not get relax.  She also had left arm pain under her left arm.  Started taking a baby aspirin and states that her symptoms were resolved.  Blood pressure well-controlled.  She is scheduled today for office evaluation.  She states she has not had issues since taking her baby aspirin.  Still would like to prioritize her health and wants to be evaluated.  She has not had ischemic evaluation in the past. Denies any any recent chest pain, shortness of breath, palpitations, syncope, presyncope, dizziness, orthopnea, PND, swelling or significant weight changes, acute bleeding, or claudication.  03/21/2024 - She is here for follow-up. Doing well. Denies any recurrent chest pain. Denies any chest pain, palpitations, syncope, presyncope, dizziness, orthopnea, PND, swelling or significant weight changes, acute bleeding, or claudication. Planning to lose weight. Does admit to some DOE at times.   06/25/2024 - Here for follow-up. Doing well.  Denies any acute cardiac complaints or issues, tells me she takes  spironolactone  as needed or every once in a while. Denies any chest pain, shortness of breath, palpitations, syncope, presyncope, dizziness, orthopnea, PND, swelling or significant weight changes, acute bleeding, or claudication.   ROS: Negative.  See HPI. SH: Daughter is retired from [employer] for 28 years.  States daughter diagnosed with PTSD and has been helping her daughter for the last 2 years.  Admits to stress with this and weight gain.  Patient states she is planning to prioritize her health currently.  Tells me daughter is in [place] currently on vacation.  Studies Reviewed  EKG: EKG is not ordered today.   CCTA 01/2024:  IMPRESSION: 1. No evidence of coronary disease, CADRADS=0.   2. Coronary calcium  score of 0. This was 0 percentile for age-, sex, and race-matched controls.   3. Total plaque volume not available.   4. Normal coronary origin with right dominance.   RECOMMENDATIONS: CAD-RADS 0: No evidence of CAD (0%). Consider non-atherosclerotic causes of chest pain.   IMPRESSION: No acute extracardiac incidental findings.  Echocardiogram 06/2023: 1. Left ventricular ejection fraction, by estimation, is 60 to 65%. The  left ventricle has normal function. The left ventricle has no regional  wall motion abnormalities. There is mild concentric left ventricular  hypertrophy. Left ventricular diastolic  parameters are consistent with Grade I diastolic dysfunction (impaired  relaxation).   2. Right ventricular systolic function is normal. The right ventricular  size is normal. There is normal pulmonary artery systolic pressure. The  estimated right ventricular systolic pressure is 33.0 mmHg.   3. The mitral  valve is grossly normal. Mild mitral valve regurgitation.   4. The aortic valve is tricuspid. Aortic valve regurgitation is not  visualized. No aortic stenosis is present.   5. The inferior vena cava is normal in size with greater than 50%  respiratory variability,  suggesting right atrial pressure of 3 mmHg.   Comparison(s): Prior images reviewed side by side. LVEF remains normal range at 60-65%.   Physical Exam VS:  BP (!) 148/88 (BP Location: Left Arm)   Pulse 71   Ht 5' 5 (1.651 m)   Wt 204 lb 12.8 oz (92.9 kg)   SpO2 97%   BMI 34.08 kg/m    Wt Readings from Last 3 Encounters:  06/25/24 204 lb 12.8 oz (92.9 kg)  03/21/24 203 lb 9.6 oz (92.4 kg)  01/31/24 202 lb 9.6 oz (91.9 kg)    Repeat BP on exam: 150/82  GEN: Obese, 73 y.o. female in no acute distress NECK: No JVD; No carotid bruits CARDIAC: S1/S2, RRR, no murmurs, rubs, gallops RESPIRATORY:  Clear to auscultation without rales, wheezing or rhonchi  ABDOMEN: Soft, non-tender, non-distended EXTREMITIES:  No edema; No deformity   ASSESSMENT AND PLAN  HTN Blood pressure elevated and not at goal.  Instructed and encouraged her to take spironolactone  25 mg daily and consistently.  She verbalized understanding.  Discussed to monitor BP at home at least 2 hours after medications and sitting for 5-10 minutes.  Discussed SBP goal is less than 140.  No medication changes at this time.  Heart healthy diet and regular cardiovascular exercise encouraged.  Will obtain BMET in 2 weeks.  Obesity Weight loss via diet and exercise encouraged. Discussed the impact being overweight would have on cardiovascular risk.  3. Dyspnea on exertion Overall past reassuring cardiac workup. Recommended lifestyle changes and regular exercise. If no improvement by future office visit, will consider PFT's. Continue to follow with PCP.   Dispo: Care and ED precautions discussed. Follow-up with MD/APP in 1 year or sooner anything changes.  Signed, Almarie Crate, NP

## 2024-07-12 LAB — BASIC METABOLIC PANEL WITH GFR
BUN/Creatinine Ratio: 20 (ref 12–28)
BUN: 19 mg/dL (ref 8–27)
CO2: 24 mmol/L (ref 20–29)
Calcium: 9.3 mg/dL (ref 8.7–10.3)
Chloride: 103 mmol/L (ref 96–106)
Creatinine, Ser: 0.95 mg/dL (ref 0.57–1.00)
Glucose: 82 mg/dL (ref 70–99)
Potassium: 5.2 mmol/L (ref 3.5–5.2)
Sodium: 139 mmol/L (ref 134–144)
eGFR: 64 mL/min/1.73 (ref >59)

## 2024-07-19 ENCOUNTER — Ambulatory Visit: Payer: Self-pay | Admitting: Nurse Practitioner

## 2024-09-03 ENCOUNTER — Telehealth: Payer: Self-pay

## 2024-09-03 NOTE — Telephone Encounter (Signed)
 Tanya Rogers called to report bruising and indentation of her L leg. PA Clorinda Sender consulted and he reviewed the patients chart.  He recommended that she continue compression and elevation of leg and follow up with PCP.  [name] verbalized understanding of the above recommendations.

## 2024-09-18 ENCOUNTER — Encounter: Admitting: Family Medicine

## 2024-09-19 ENCOUNTER — Telehealth: Payer: Self-pay

## 2024-09-19 NOTE — Telephone Encounter (Signed)
 Copied from CRM #[id number]. Topic: Appointments - Scheduling Inquiry for Clinic >> Sep 19, 2024  9:10 AM Rosaria BRAVO wrote: Reason for CRM: Pt needs to be rescheduled for AWV, no slots available. Please advise   Best contact: [phone]

## 2024-09-24 ENCOUNTER — Encounter: Admitting: Family Medicine

## 2024-09-27 ENCOUNTER — Ambulatory Visit: Admitting: Family Medicine

## 2024-09-27 ENCOUNTER — Ambulatory Visit (HOSPITAL_COMMUNITY)
Admission: RE | Admit: 2024-09-27 | Discharge: 2024-09-27 | Disposition: A | Source: Ambulatory Visit | Attending: Family Medicine | Admitting: Family Medicine

## 2024-09-27 VITALS — BP 137/80 | HR 68 | Ht 65.0 in | Wt 201.0 lb

## 2024-09-27 DIAGNOSIS — M79605 Pain in left leg: Secondary | ICD-10-CM | POA: Insufficient documentation

## 2024-09-27 DIAGNOSIS — L989 Disorder of the skin and subcutaneous tissue, unspecified: Secondary | ICD-10-CM | POA: Diagnosis not present

## 2024-09-27 DIAGNOSIS — Z0001 Encounter for general adult medical examination with abnormal findings: Secondary | ICD-10-CM

## 2024-09-27 MED ORDER — CEPHALEXIN 500 MG PO CAPS: 500.0000 mg | ORAL_CAPSULE | Freq: Four times a day (QID) | ORAL | 0 refills | Status: AC

## 2024-09-27 NOTE — Assessment & Plan Note (Signed)
 Keflex  prescribed will obtian x ray of leg and also refer to Dermatology

## 2024-09-27 NOTE — Progress Notes (Signed)
" ° ° °  Tanya Rogers     MRN: [id number]      DOB: 1951-03-14  Chief Complaint  Patient presents with   Annual Exam    CPE   Medical Management of Chronic Issues    Pt also having left leg pain    HPI: Patient is in for annual physical exam. C/o persistent area on LLE , increasing in size, dark and tender, has ahd vascular and orthopedic eval over the past 2 o 3 years, worsening and no clear answer Immunization is reviewed , and  declines all vaccines   PE: BP 137/80   Pulse 68   Ht 5' 5 (1.651 m)   Wt 201 lb 0.6 oz (91.2 kg)   SpO2 97%   BMI 33.45 kg/m   Pleasant  female, alert and oriented x 3, in no cardio-pulmonary distress. Afebrile. HEENT No facial trauma or asymetry. Sinuses non tender.  Extra occullar muscles intact.. External ears normal, . Neck: supple, no adenopathy,JVD or thyromegaly.No bruits.  Chest: Clear to ascultation bilaterally.No crackles or wheezes. Non tender to palpation   Cardiovascular system; Heart sounds normal,  S1 and  S2 ,no S3.  No murmur, or thrill. Apical beat not displaced Peripheral pulses normal.  Abdomen: Soft, non tender, no organomegaly or masses.    Musculoskeletal exam: Full ROM of spine, hips , shoulders and knees. No deformity ,swelling or crepitus noted. No muscle wasting or atrophy.   Neurologic: Cranial nerves 2 to 12 intact. Power, tone ,sensation  normal throughout. No disturbance in gait. No tremor.  Skin: Intact,hyperpigmentation, mild  erythema and  rash noted.on LLE extending distally to ankle Pigmentation normal throughout  Psych; Normal mood and affect. Judgement and concentration normal   Assessment & Plan:  Encounter for Medicare annual examination with abnormal findings Annual exam as documented. . Immunization and cancer screening needs are specifically addressed at this visit.   Left leg pain Keflex  prescribed will obtian x ray of leg and also refer to Dermatology  Skin lesion of  left leg Hyperpigmented lesion of LLE tender, gradually increasing in size over 3 to 5 years, Dermatology eval  "

## 2024-09-27 NOTE — Assessment & Plan Note (Signed)
 Hyperpigmented lesion of LLE tender, gradually increasing in size over 3 to 5 years, Dermatology eval

## 2024-09-27 NOTE — Assessment & Plan Note (Addendum)
 Annual exam as documented. . Immunization and cancer screening needs are specifically addressed at this visit.

## 2024-09-27 NOTE — Patient Instructions (Addendum)
 F/u in 6 months  X ray of left leg  today  Labs today CBC, lipid, cmp and EGFr, tSH , vit D  Keflex , an antibiotic is prescribed for the left leg  You will be referred to specialist / dermatology regarding the condition on your left leg  Think about what you will eat, plan ahead. Choose  clean, green, fresh or frozen over canned, processed or packaged foods which are more sugary, salty and fatty. 70 to 75% of food eaten should be vegetables and fruit. Three meals at set times with snacks allowed between meals, but they must be fruit or vegetables. Aim to eat over a 12 hour period , example 7 am to 7 pm, and STOP after  your last meal of the day. Drink water ,generally about 64 ounces per day, no other drink is as healthy. Fruit juice is best enjoyed in a healthy way, by EATING the fruit.  Thanks for choosing [facility], we consider it a privelige to serve you.   Thanks for choosing [facility], we consider it a privelige to serve you.

## 2024-09-28 LAB — CBC
Hematocrit: 38.4 % (ref 34.0–46.6)
Hemoglobin: 12.7 g/dL (ref 11.1–15.9)
MCH: 30.6 pg (ref 26.6–33.0)
MCHC: 33.1 g/dL (ref 31.5–35.7)
MCV: 93 fL (ref 79–97)
Platelets: 185 10*3/uL (ref 150–450)
RBC: 4.15 x10E6/uL (ref 3.77–5.28)
RDW: 12.9 % (ref 11.7–15.4)
WBC: 6.3 10*3/uL (ref 3.4–10.8)

## 2024-09-28 LAB — CMP14+EGFR
ALT: 25 [IU]/L (ref 0–32)
AST: 25 [IU]/L (ref 0–40)
Albumin: 4.2 g/dL (ref 3.8–4.8)
Alkaline Phosphatase: 73 [IU]/L (ref 49–135)
BUN/Creatinine Ratio: 20 (ref 12–28)
BUN: 20 mg/dL (ref 8–27)
Bilirubin Total: 0.5 mg/dL (ref 0.0–1.2)
CO2: 23 mmol/L (ref 20–29)
Calcium: 9.1 mg/dL (ref 8.7–10.3)
Chloride: 105 mmol/L (ref 96–106)
Creatinine, Ser: 1.01 mg/dL — ABNORMAL HIGH (ref 0.57–1.00)
Globulin, Total: 2.7 g/dL (ref 1.5–4.5)
Glucose: 89 mg/dL (ref 70–99)
Potassium: 4.5 mmol/L (ref 3.5–5.2)
Sodium: 141 mmol/L (ref 134–144)
Total Protein: 6.9 g/dL (ref 6.0–8.5)
eGFR: 59 mL/min/{1.73_m2} — ABNORMAL LOW

## 2024-09-28 LAB — VITAMIN D 25 HYDROXY (VIT D DEFICIENCY, FRACTURES): Vit D, 25-Hydroxy: 28.3 ng/mL — ABNORMAL LOW (ref 30.0–100.0)

## 2024-09-28 LAB — LIPID PANEL W/O CHOL/HDL RATIO
Cholesterol, Total: 114 mg/dL (ref 100–199)
HDL: 45 mg/dL
LDL Chol Calc (NIH): 57 mg/dL (ref 0–99)
Triglycerides: 51 mg/dL (ref 0–149)
VLDL Cholesterol Cal: 12 mg/dL (ref 5–40)

## 2024-09-28 LAB — TSH: TSH: 1.15 u[IU]/mL (ref 0.450–4.500)

## 2024-09-29 ENCOUNTER — Ambulatory Visit: Payer: Self-pay | Admitting: Family Medicine

## 2024-09-30 NOTE — Telephone Encounter (Signed)
 Attempted to speak with patient to reschedule AWV. No answer. Left vm with info to return call....Tanya Rogers w

## 2024-10-01 ENCOUNTER — Telehealth: Payer: Self-pay | Admitting: Family Medicine

## 2024-10-01 NOTE — Telephone Encounter (Signed)
 Copied from CRM [id number]. Topic: Appointments - Scheduling Inquiry for Clinic >> Sep 30, 2024  3:10 PM Mia F wrote: Reason for CRM: Pt called to speak with nurse about her AWV. She says someone called her this morning but no notes were left. Pt says there were some confusion about her AWV and she need to speak with the nurse. Please call pt or reach her through My Chart

## 2024-10-04 ENCOUNTER — Encounter: Admitting: Family Medicine

## 2024-10-04 NOTE — Telephone Encounter (Signed)
 Patient rescheduled.SABRASABRASABRAAbby Rogers

## 2024-10-04 NOTE — Telephone Encounter (Signed)
 Issue resolved.

## 2024-10-28 ENCOUNTER — Encounter: Admitting: Family Medicine

## 2024-11-08 ENCOUNTER — Encounter: Admitting: Family Medicine

## 2024-11-21 ENCOUNTER — Encounter: Admitting: Family Medicine

## 2024-11-26 ENCOUNTER — Ambulatory Visit

## 2025-03-27 ENCOUNTER — Ambulatory Visit: Payer: Self-pay | Admitting: Family Medicine
